# Patient Record
Sex: Male | Born: 1950 | Race: White | Hispanic: No | Marital: Married | Smoking: Never smoker
Health system: Southern US, Community
[De-identification: ages and names within clinical notes are randomized; demographics above are authoritative.]

## PROBLEM LIST (undated history)

## (undated) DIAGNOSIS — G4730 Sleep apnea, unspecified: Secondary | ICD-10-CM

## (undated) DIAGNOSIS — M549 Dorsalgia, unspecified: Secondary | ICD-10-CM

## (undated) DIAGNOSIS — R0600 Dyspnea, unspecified: Secondary | ICD-10-CM

## (undated) DIAGNOSIS — I10 Essential (primary) hypertension: Secondary | ICD-10-CM

## (undated) DIAGNOSIS — F32A Depression, unspecified: Secondary | ICD-10-CM

## (undated) DIAGNOSIS — R011 Cardiac murmur, unspecified: Secondary | ICD-10-CM

## (undated) DIAGNOSIS — I471 Supraventricular tachycardia, unspecified: Secondary | ICD-10-CM

## (undated) DIAGNOSIS — Z923 Personal history of irradiation: Secondary | ICD-10-CM

## (undated) DIAGNOSIS — I6529 Occlusion and stenosis of unspecified carotid artery: Secondary | ICD-10-CM

## (undated) DIAGNOSIS — K8020 Calculus of gallbladder without cholecystitis without obstruction: Secondary | ICD-10-CM

## (undated) DIAGNOSIS — F329 Major depressive disorder, single episode, unspecified: Secondary | ICD-10-CM

## (undated) DIAGNOSIS — I499 Cardiac arrhythmia, unspecified: Secondary | ICD-10-CM

## (undated) DIAGNOSIS — G8929 Other chronic pain: Secondary | ICD-10-CM

## (undated) DIAGNOSIS — I480 Paroxysmal atrial fibrillation: Secondary | ICD-10-CM

## (undated) DIAGNOSIS — M1990 Unspecified osteoarthritis, unspecified site: Secondary | ICD-10-CM

## (undated) DIAGNOSIS — C801 Malignant (primary) neoplasm, unspecified: Secondary | ICD-10-CM

## (undated) HISTORY — PX: ELBOW SURGERY: SHX618

## (undated) HISTORY — DX: Depression, unspecified: F32.A

## (undated) HISTORY — PX: FRACTURE SURGERY: SHX138

## (undated) HISTORY — DX: Supraventricular tachycardia, unspecified: I47.10

## (undated) HISTORY — DX: Calculus of gallbladder without cholecystitis without obstruction: K80.20

## (undated) HISTORY — DX: Personal history of irradiation: Z92.3

## (undated) HISTORY — DX: Major depressive disorder, single episode, unspecified: F32.9

## (undated) HISTORY — PX: KNEE SURGERY: SHX244

## (undated) HISTORY — DX: Malignant (primary) neoplasm, unspecified: C80.1

## (undated) HISTORY — DX: Other chronic pain: G89.29

## (undated) HISTORY — PX: APPENDECTOMY: SHX54

## (undated) HISTORY — PX: TONSILLECTOMY: SUR1361

## (undated) HISTORY — DX: Supraventricular tachycardia: I47.1

## (undated) HISTORY — DX: Unspecified osteoarthritis, unspecified site: M19.90

## (undated) HISTORY — DX: Other chronic pain: M54.9

---

## 1977-08-12 HISTORY — PX: OTHER SURGICAL HISTORY: SHX169

## 1988-06-14 HISTORY — PX: BACK SURGERY: SHX140

## 1999-08-13 HISTORY — PX: LUMBAR LAMINECTOMY: SHX95

## 2002-12-29 ENCOUNTER — Inpatient Hospital Stay (HOSPITAL_COMMUNITY): Admission: EM | Admit: 2002-12-29 | Discharge: 2002-12-31 | Payer: Self-pay | Admitting: Emergency Medicine

## 2002-12-29 ENCOUNTER — Encounter: Payer: Self-pay | Admitting: Emergency Medicine

## 2003-01-09 ENCOUNTER — Encounter: Admission: RE | Admit: 2003-01-09 | Discharge: 2003-01-09 | Payer: Self-pay | Admitting: Internal Medicine

## 2003-01-09 ENCOUNTER — Encounter: Payer: Self-pay | Admitting: Internal Medicine

## 2003-01-11 ENCOUNTER — Emergency Department (HOSPITAL_COMMUNITY): Admission: EM | Admit: 2003-01-11 | Discharge: 2003-01-11 | Payer: Self-pay | Admitting: Emergency Medicine

## 2003-05-14 ENCOUNTER — Encounter: Admission: RE | Admit: 2003-05-14 | Discharge: 2003-05-14 | Payer: Self-pay | Admitting: Internal Medicine

## 2004-04-02 ENCOUNTER — Ambulatory Visit: Payer: Self-pay | Admitting: Anesthesiology

## 2004-05-04 ENCOUNTER — Ambulatory Visit: Payer: Self-pay | Admitting: Anesthesiology

## 2004-05-27 ENCOUNTER — Ambulatory Visit: Payer: Self-pay | Admitting: Anesthesiology

## 2004-06-16 ENCOUNTER — Ambulatory Visit: Payer: Self-pay | Admitting: Anesthesiology

## 2004-07-20 ENCOUNTER — Ambulatory Visit: Payer: Self-pay | Admitting: Anesthesiology

## 2004-08-10 ENCOUNTER — Ambulatory Visit: Payer: Self-pay | Admitting: Anesthesiology

## 2004-08-24 ENCOUNTER — Ambulatory Visit: Payer: Self-pay | Admitting: Anesthesiology

## 2004-09-23 ENCOUNTER — Ambulatory Visit: Payer: Self-pay | Admitting: Anesthesiology

## 2004-10-14 ENCOUNTER — Ambulatory Visit: Payer: Self-pay | Admitting: Internal Medicine

## 2004-10-20 ENCOUNTER — Ambulatory Visit: Payer: Self-pay | Admitting: Anesthesiology

## 2004-11-19 ENCOUNTER — Ambulatory Visit: Payer: Self-pay | Admitting: Anesthesiology

## 2004-12-10 ENCOUNTER — Ambulatory Visit: Payer: Self-pay | Admitting: Anesthesiology

## 2005-01-05 ENCOUNTER — Ambulatory Visit: Payer: Self-pay | Admitting: Anesthesiology

## 2005-02-08 ENCOUNTER — Ambulatory Visit: Payer: Self-pay | Admitting: Anesthesiology

## 2005-02-23 ENCOUNTER — Ambulatory Visit: Payer: Self-pay | Admitting: Anesthesiology

## 2005-03-17 ENCOUNTER — Ambulatory Visit: Payer: Self-pay | Admitting: Anesthesiology

## 2005-04-28 ENCOUNTER — Ambulatory Visit: Payer: Self-pay | Admitting: Anesthesiology

## 2005-06-02 ENCOUNTER — Ambulatory Visit: Payer: Self-pay | Admitting: Anesthesiology

## 2005-06-29 ENCOUNTER — Ambulatory Visit: Payer: Self-pay | Admitting: Anesthesiology

## 2005-07-27 ENCOUNTER — Ambulatory Visit: Payer: Self-pay | Admitting: Anesthesiology

## 2005-08-25 ENCOUNTER — Ambulatory Visit: Payer: Self-pay | Admitting: Anesthesiology

## 2005-09-23 ENCOUNTER — Ambulatory Visit: Payer: Self-pay | Admitting: Anesthesiology

## 2005-10-28 ENCOUNTER — Ambulatory Visit: Payer: Self-pay | Admitting: Anesthesiology

## 2005-11-25 ENCOUNTER — Ambulatory Visit: Payer: Self-pay | Admitting: Anesthesiology

## 2005-12-20 ENCOUNTER — Ambulatory Visit: Payer: Self-pay | Admitting: Anesthesiology

## 2006-01-24 ENCOUNTER — Ambulatory Visit: Payer: Self-pay | Admitting: Anesthesiology

## 2006-02-17 ENCOUNTER — Ambulatory Visit: Payer: Self-pay | Admitting: Anesthesiology

## 2006-03-22 ENCOUNTER — Ambulatory Visit: Payer: Self-pay | Admitting: Anesthesiology

## 2006-04-20 ENCOUNTER — Ambulatory Visit: Payer: Self-pay | Admitting: Anesthesiology

## 2006-05-19 ENCOUNTER — Ambulatory Visit: Payer: Self-pay | Admitting: Anesthesiology

## 2006-06-20 ENCOUNTER — Ambulatory Visit: Payer: Self-pay | Admitting: Anesthesiology

## 2006-07-18 ENCOUNTER — Ambulatory Visit: Payer: Self-pay | Admitting: Anesthesiology

## 2006-08-10 ENCOUNTER — Ambulatory Visit: Payer: Self-pay | Admitting: Anesthesiology

## 2006-08-31 ENCOUNTER — Ambulatory Visit: Payer: Self-pay | Admitting: Anesthesiology

## 2006-10-03 ENCOUNTER — Ambulatory Visit: Payer: Self-pay | Admitting: Anesthesiology

## 2006-10-31 ENCOUNTER — Ambulatory Visit: Payer: Self-pay | Admitting: Anesthesiology

## 2006-11-24 ENCOUNTER — Ambulatory Visit: Payer: Self-pay | Admitting: Anesthesiology

## 2006-12-12 ENCOUNTER — Ambulatory Visit: Payer: Self-pay | Admitting: Internal Medicine

## 2006-12-27 ENCOUNTER — Ambulatory Visit: Payer: Self-pay | Admitting: Anesthesiology

## 2007-02-02 ENCOUNTER — Ambulatory Visit: Payer: Self-pay | Admitting: Anesthesiology

## 2007-03-08 ENCOUNTER — Ambulatory Visit: Payer: Self-pay | Admitting: Anesthesiology

## 2007-03-30 ENCOUNTER — Ambulatory Visit: Payer: Self-pay | Admitting: Anesthesiology

## 2007-04-27 ENCOUNTER — Ambulatory Visit: Payer: Self-pay | Admitting: Anesthesiology

## 2007-05-23 ENCOUNTER — Ambulatory Visit: Payer: Self-pay | Admitting: Anesthesiology

## 2007-07-03 ENCOUNTER — Ambulatory Visit: Payer: Self-pay | Admitting: Anesthesiology

## 2007-08-03 ENCOUNTER — Ambulatory Visit: Payer: Self-pay | Admitting: Anesthesiology

## 2007-09-01 ENCOUNTER — Ambulatory Visit: Payer: Self-pay | Admitting: Anesthesiology

## 2007-09-28 ENCOUNTER — Ambulatory Visit: Payer: Self-pay | Admitting: Anesthesiology

## 2007-10-09 ENCOUNTER — Encounter: Admission: RE | Admit: 2007-10-09 | Discharge: 2007-10-09 | Payer: Self-pay | Admitting: Internal Medicine

## 2007-11-01 ENCOUNTER — Ambulatory Visit: Payer: Self-pay | Admitting: Anesthesiology

## 2007-11-29 ENCOUNTER — Ambulatory Visit: Payer: Self-pay | Admitting: Internal Medicine

## 2007-12-04 ENCOUNTER — Ambulatory Visit: Payer: Self-pay | Admitting: Anesthesiology

## 2007-12-28 ENCOUNTER — Ambulatory Visit: Payer: Self-pay | Admitting: Anesthesiology

## 2008-01-24 ENCOUNTER — Ambulatory Visit: Payer: Self-pay | Admitting: Anesthesiology

## 2008-01-25 ENCOUNTER — Encounter: Admission: RE | Admit: 2008-01-25 | Discharge: 2008-01-25 | Payer: Self-pay | Admitting: Internal Medicine

## 2008-02-23 ENCOUNTER — Ambulatory Visit: Payer: Self-pay | Admitting: Anesthesiology

## 2008-03-27 ENCOUNTER — Ambulatory Visit: Payer: Self-pay | Admitting: Anesthesiology

## 2008-04-24 ENCOUNTER — Ambulatory Visit: Payer: Self-pay | Admitting: Anesthesiology

## 2008-05-24 ENCOUNTER — Ambulatory Visit: Payer: Self-pay | Admitting: Internal Medicine

## 2008-05-28 ENCOUNTER — Ambulatory Visit: Payer: Self-pay | Admitting: Anesthesiology

## 2008-06-26 ENCOUNTER — Ambulatory Visit: Payer: Self-pay | Admitting: Anesthesiology

## 2008-07-17 ENCOUNTER — Ambulatory Visit: Payer: Self-pay | Admitting: Anesthesiology

## 2008-08-20 ENCOUNTER — Ambulatory Visit: Payer: Self-pay | Admitting: Anesthesiology

## 2008-09-24 ENCOUNTER — Ambulatory Visit: Payer: Self-pay | Admitting: Anesthesiology

## 2008-10-14 ENCOUNTER — Ambulatory Visit: Payer: Self-pay | Admitting: Anesthesiology

## 2008-11-21 ENCOUNTER — Ambulatory Visit: Payer: Self-pay | Admitting: Anesthesiology

## 2008-12-12 DIAGNOSIS — F329 Major depressive disorder, single episode, unspecified: Secondary | ICD-10-CM | POA: Insufficient documentation

## 2008-12-12 DIAGNOSIS — M5126 Other intervertebral disc displacement, lumbar region: Secondary | ICD-10-CM | POA: Insufficient documentation

## 2008-12-12 DIAGNOSIS — I471 Supraventricular tachycardia, unspecified: Secondary | ICD-10-CM | POA: Insufficient documentation

## 2008-12-13 ENCOUNTER — Ambulatory Visit: Payer: Self-pay | Admitting: Internal Medicine

## 2008-12-24 ENCOUNTER — Ambulatory Visit: Payer: Self-pay | Admitting: Anesthesiology

## 2009-01-07 ENCOUNTER — Emergency Department (HOSPITAL_COMMUNITY): Admission: EM | Admit: 2009-01-07 | Discharge: 2009-01-07 | Payer: Self-pay | Admitting: Emergency Medicine

## 2009-01-17 ENCOUNTER — Ambulatory Visit: Payer: Self-pay | Admitting: Internal Medicine

## 2009-01-25 ENCOUNTER — Emergency Department (HOSPITAL_COMMUNITY): Admission: EM | Admit: 2009-01-25 | Discharge: 2009-01-25 | Payer: Self-pay | Admitting: Emergency Medicine

## 2009-01-27 ENCOUNTER — Telehealth: Payer: Self-pay | Admitting: Internal Medicine

## 2009-02-03 ENCOUNTER — Ambulatory Visit: Payer: Self-pay | Admitting: Internal Medicine

## 2009-02-06 ENCOUNTER — Ambulatory Visit: Payer: Self-pay | Admitting: Anesthesiology

## 2009-02-18 ENCOUNTER — Ambulatory Visit: Payer: Self-pay | Admitting: Internal Medicine

## 2009-02-19 ENCOUNTER — Emergency Department (HOSPITAL_COMMUNITY): Admission: EM | Admit: 2009-02-19 | Discharge: 2009-02-19 | Payer: Self-pay | Admitting: Emergency Medicine

## 2009-02-20 ENCOUNTER — Inpatient Hospital Stay (HOSPITAL_COMMUNITY): Admission: RE | Admit: 2009-02-20 | Discharge: 2009-02-20 | Payer: Self-pay | Admitting: Internal Medicine

## 2009-02-20 ENCOUNTER — Ambulatory Visit: Payer: Self-pay | Admitting: Internal Medicine

## 2009-02-21 LAB — CONVERTED CEMR LAB
BUN: 14 mg/dL (ref 6–23)
Basophils Relative: 0.5 % (ref 0.0–3.0)
CO2: 30 meq/L (ref 19–32)
Chloride: 103 meq/L (ref 96–112)
Creatinine, Ser: 0.9 mg/dL (ref 0.4–1.5)
Eosinophils Absolute: 0.2 10*3/uL (ref 0.0–0.7)
Eosinophils Relative: 3.6 % (ref 0.0–5.0)
HCT: 44.6 % (ref 39.0–52.0)
INR: 1 (ref 0.8–1.0)
Lymphs Abs: 1.3 10*3/uL (ref 0.7–4.0)
MCHC: 33.4 g/dL (ref 30.0–36.0)
MCV: 85.8 fL (ref 78.0–100.0)
Monocytes Absolute: 0.5 10*3/uL (ref 0.1–1.0)
Neutrophils Relative %: 64.6 % (ref 43.0–77.0)
Platelets: 193 10*3/uL (ref 150.0–400.0)
Potassium: 3.8 meq/L (ref 3.5–5.1)
Prothrombin Time: 10.8 s (ref 9.1–11.7)
WBC: 5.6 10*3/uL (ref 4.5–10.5)

## 2009-03-07 ENCOUNTER — Ambulatory Visit: Payer: Self-pay | Admitting: Anesthesiology

## 2009-03-25 ENCOUNTER — Encounter: Payer: Self-pay | Admitting: Internal Medicine

## 2009-03-27 ENCOUNTER — Ambulatory Visit: Payer: Self-pay | Admitting: Internal Medicine

## 2009-05-06 ENCOUNTER — Ambulatory Visit: Payer: Self-pay | Admitting: Anesthesiology

## 2009-06-14 DIAGNOSIS — C801 Malignant (primary) neoplasm, unspecified: Secondary | ICD-10-CM

## 2009-06-14 HISTORY — DX: Malignant (primary) neoplasm, unspecified: C80.1

## 2009-06-24 ENCOUNTER — Ambulatory Visit: Payer: Self-pay | Admitting: Anesthesiology

## 2009-09-18 ENCOUNTER — Ambulatory Visit: Payer: Self-pay | Admitting: Anesthesiology

## 2009-12-08 ENCOUNTER — Ambulatory Visit: Payer: Self-pay | Admitting: Anesthesiology

## 2010-01-19 IMAGING — CR DG CHEST 1V PORT
1 series · 1 of 1 positions shown · non-contrast
Comparison: Chest 01/25/2009.

CLINICAL DATA: Chest pain.

PORTABLE CHEST - 1 VIEW

[view not recorded]
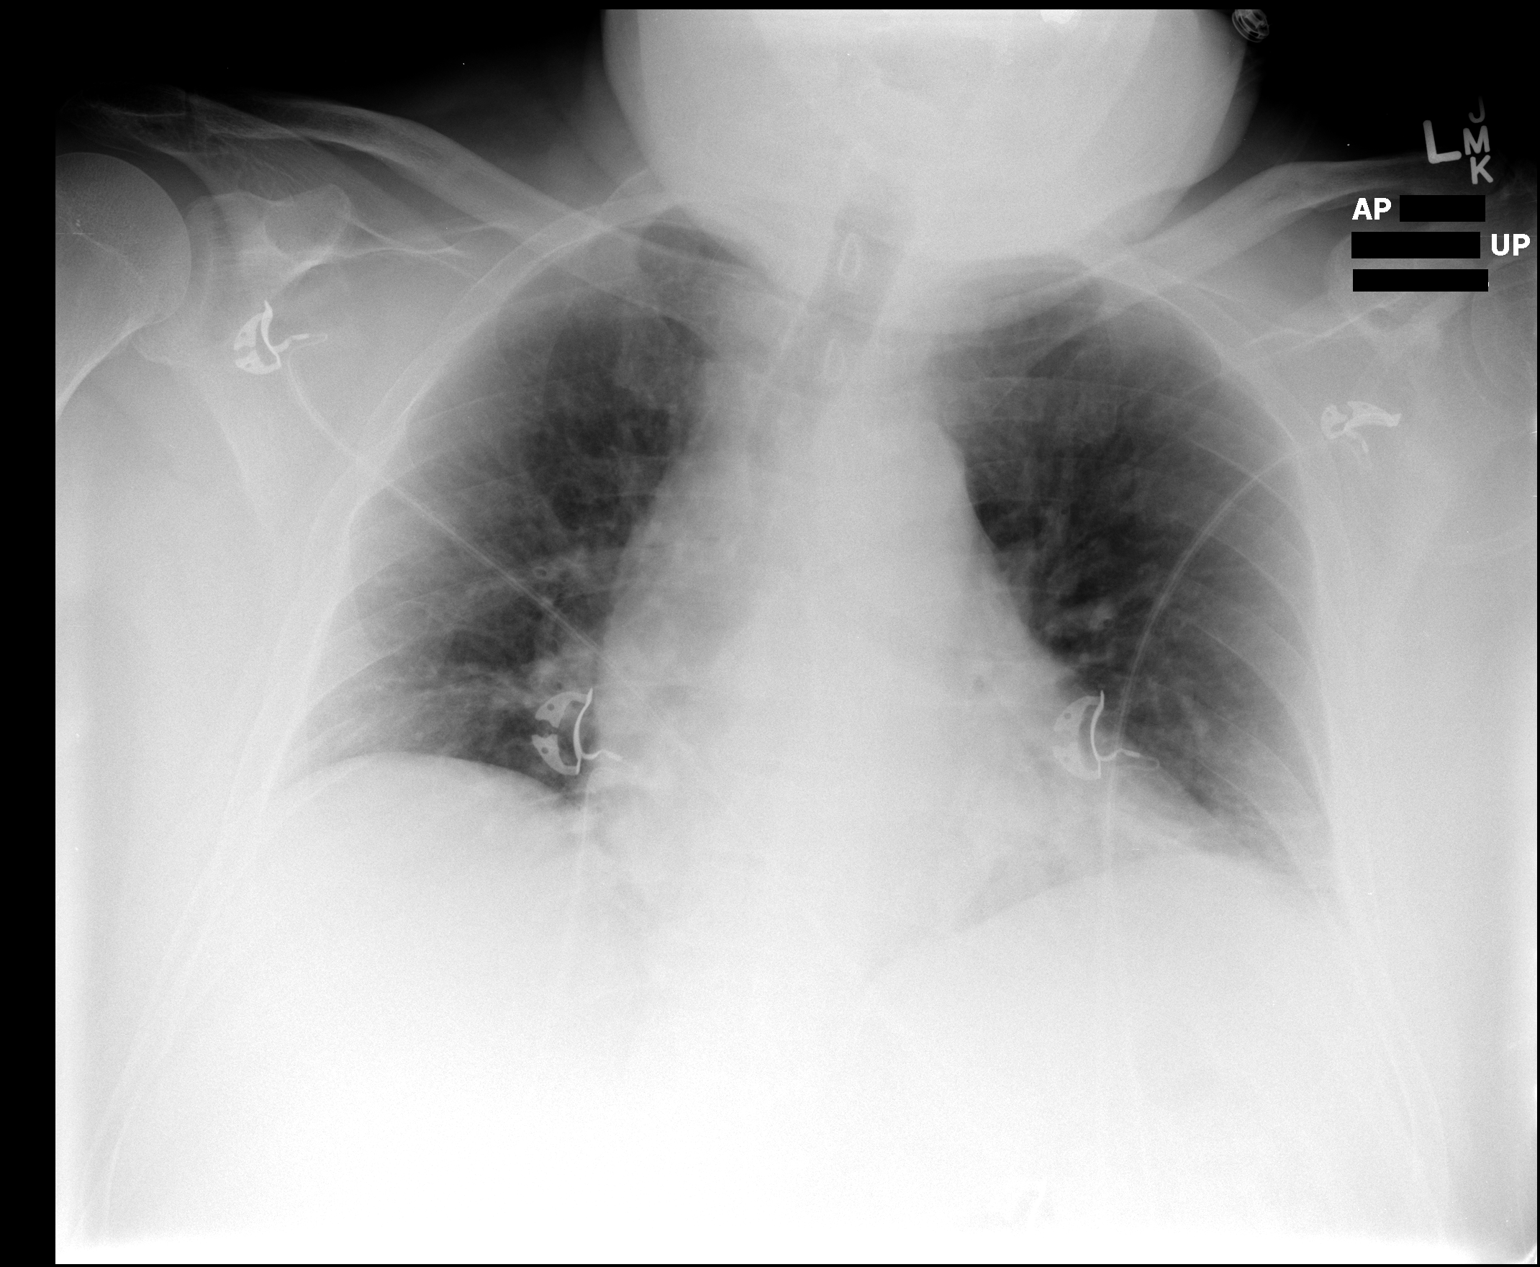

[1 of 1 positions shown; findings below may reference images not displayed]

FINDINGS: Lung volumes are low with some mild left basilar
subsegmental atelectasis or scar.  Lungs otherwise clear.  Mild
cardiomegaly noted.  No effusion.
IMPRESSION: No acute disease.

## 2010-01-20 ENCOUNTER — Encounter: Admission: RE | Admit: 2010-01-20 | Discharge: 2010-01-20 | Payer: Self-pay | Admitting: Otolaryngology

## 2010-01-20 ENCOUNTER — Other Ambulatory Visit: Admission: RE | Admit: 2010-01-20 | Discharge: 2010-01-20 | Payer: Self-pay | Admitting: Otolaryngology

## 2010-01-27 ENCOUNTER — Ambulatory Visit (HOSPITAL_COMMUNITY): Admission: RE | Admit: 2010-01-27 | Discharge: 2010-01-27 | Payer: Self-pay | Admitting: Otolaryngology

## 2010-01-29 ENCOUNTER — Encounter: Admission: AD | Admit: 2010-01-29 | Discharge: 2010-01-29 | Payer: Self-pay | Admitting: Dentistry

## 2010-01-29 ENCOUNTER — Ambulatory Visit: Payer: Self-pay | Admitting: Dentistry

## 2010-01-30 ENCOUNTER — Ambulatory Visit (HOSPITAL_BASED_OUTPATIENT_CLINIC_OR_DEPARTMENT_OTHER): Admission: RE | Admit: 2010-01-30 | Discharge: 2010-01-30 | Payer: Self-pay | Admitting: Otolaryngology

## 2010-02-02 ENCOUNTER — Ambulatory Visit: Payer: Self-pay | Admitting: Dentistry

## 2010-02-03 ENCOUNTER — Ambulatory Visit: Admission: RE | Admit: 2010-02-03 | Discharge: 2010-04-16 | Payer: Self-pay | Admitting: Radiation Oncology

## 2010-02-04 ENCOUNTER — Encounter: Payer: Self-pay | Admitting: Internal Medicine

## 2010-02-04 ENCOUNTER — Ambulatory Visit: Payer: Self-pay | Admitting: Oncology

## 2010-02-04 LAB — COMPREHENSIVE METABOLIC PANEL
AST: 21 U/L (ref 0–37)
Albumin: 4 g/dL (ref 3.5–5.2)
Alkaline Phosphatase: 76 U/L (ref 39–117)
BUN: 15 mg/dL (ref 6–23)
Calcium: 9.5 mg/dL (ref 8.4–10.5)
Chloride: 104 mEq/L (ref 96–112)
Potassium: 3.6 mEq/L (ref 3.5–5.3)
Sodium: 140 mEq/L (ref 135–145)
Total Protein: 7 g/dL (ref 6.0–8.3)

## 2010-02-04 LAB — CBC WITH DIFFERENTIAL/PLATELET
Basophils Absolute: 0 10*3/uL (ref 0.0–0.1)
EOS%: 4.3 % (ref 0.0–7.0)
Eosinophils Absolute: 0.3 10*3/uL (ref 0.0–0.5)
HGB: 14.3 g/dL (ref 13.0–17.1)
MCH: 28.1 pg (ref 27.2–33.4)
NEUT#: 4.2 10*3/uL (ref 1.5–6.5)
RBC: 5.09 10*6/uL (ref 4.20–5.82)
RDW: 12.9 % (ref 11.0–14.6)
lymph#: 1.9 10*3/uL (ref 0.9–3.3)

## 2010-02-27 ENCOUNTER — Ambulatory Visit (HOSPITAL_COMMUNITY): Admission: RE | Admit: 2010-02-27 | Discharge: 2010-02-27 | Payer: Self-pay | Admitting: Radiation Oncology

## 2010-03-02 ENCOUNTER — Encounter: Payer: Self-pay | Admitting: Internal Medicine

## 2010-03-02 LAB — CBC WITH DIFFERENTIAL/PLATELET
BASO%: 0.6 % (ref 0.0–2.0)
EOS%: 5.3 % (ref 0.0–7.0)
HCT: 44.8 % (ref 38.4–49.9)
LYMPH%: 28.1 % (ref 14.0–49.0)
MCH: 28.2 pg (ref 27.2–33.4)
MCHC: 34.4 g/dL (ref 32.0–36.0)
MCV: 81.9 fL (ref 79.3–98.0)
MONO%: 9.4 % (ref 0.0–14.0)
NEUT%: 56.6 % (ref 39.0–75.0)
Platelets: 168 10*3/uL (ref 140–400)

## 2010-03-02 LAB — COMPREHENSIVE METABOLIC PANEL
ALT: 24 U/L (ref 0–53)
CO2: 26 mEq/L (ref 19–32)
Sodium: 135 mEq/L (ref 135–145)
Total Bilirubin: 1.4 mg/dL — ABNORMAL HIGH (ref 0.3–1.2)
Total Protein: 7.7 g/dL (ref 6.0–8.3)

## 2010-03-02 LAB — MAGNESIUM: Magnesium: 2 mg/dL (ref 1.5–2.5)

## 2010-03-06 ENCOUNTER — Ambulatory Visit: Payer: Self-pay | Admitting: Oncology

## 2010-03-06 LAB — BASIC METABOLIC PANEL
BUN: 20 mg/dL (ref 6–23)
Creatinine, Ser: 1.02 mg/dL (ref 0.40–1.50)

## 2010-03-06 LAB — MAGNESIUM: Magnesium: 1.8 mg/dL (ref 1.5–2.5)

## 2010-03-10 ENCOUNTER — Encounter
Admission: RE | Admit: 2010-03-10 | Discharge: 2010-06-03 | Payer: Self-pay | Source: Home / Self Care | Attending: Radiation Oncology | Admitting: Radiation Oncology

## 2010-03-13 LAB — BASIC METABOLIC PANEL
Calcium: 9.4 mg/dL (ref 8.4–10.5)
Creatinine, Ser: 0.92 mg/dL (ref 0.40–1.50)
Sodium: 137 mEq/L (ref 135–145)

## 2010-03-13 LAB — MAGNESIUM: Magnesium: 1.8 mg/dL (ref 1.5–2.5)

## 2010-03-20 LAB — CBC WITH DIFFERENTIAL/PLATELET
Basophils Absolute: 0 10*3/uL (ref 0.0–0.1)
EOS%: 5.1 % (ref 0.0–7.0)
Eosinophils Absolute: 0.1 10*3/uL (ref 0.0–0.5)
HGB: 12.8 g/dL — ABNORMAL LOW (ref 13.0–17.1)
LYMPH%: 22.4 % (ref 14.0–49.0)
MCH: 28 pg (ref 27.2–33.4)
MCV: 81.4 fL (ref 79.3–98.0)
MONO%: 37.2 % — ABNORMAL HIGH (ref 0.0–14.0)
NEUT#: 0.5 10*3/uL — ABNORMAL LOW (ref 1.5–6.5)
Platelets: 261 10*3/uL (ref 140–400)

## 2010-03-20 LAB — COMPREHENSIVE METABOLIC PANEL
Alkaline Phosphatase: 81 U/L (ref 39–117)
BUN: 20 mg/dL (ref 6–23)
CO2: 29 mEq/L (ref 19–32)
Creatinine, Ser: 0.94 mg/dL (ref 0.40–1.50)
Glucose, Bld: 107 mg/dL — ABNORMAL HIGH (ref 70–99)
Total Bilirubin: 0.8 mg/dL (ref 0.3–1.2)

## 2010-03-20 LAB — MAGNESIUM: Magnesium: 2.1 mg/dL (ref 1.5–2.5)

## 2010-03-23 LAB — CBC WITH DIFFERENTIAL/PLATELET
BASO%: 0.4 % (ref 0.0–2.0)
Eosinophils Absolute: 0.1 10*3/uL (ref 0.0–0.5)
HCT: 37 % — ABNORMAL LOW (ref 38.4–49.9)
HGB: 12.8 g/dL — ABNORMAL LOW (ref 13.0–17.1)
MCHC: 34.6 g/dL (ref 32.0–36.0)
MONO#: 0.6 10*3/uL (ref 0.1–0.9)
NEUT#: 1.7 10*3/uL (ref 1.5–6.5)
NEUT%: 60.6 % (ref 39.0–75.0)
WBC: 2.8 10*3/uL — ABNORMAL LOW (ref 4.0–10.3)
lymph#: 0.4 10*3/uL — ABNORMAL LOW (ref 0.9–3.3)

## 2010-03-24 ENCOUNTER — Ambulatory Visit (HOSPITAL_COMMUNITY): Admission: RE | Admit: 2010-03-24 | Discharge: 2010-03-24 | Payer: Self-pay | Admitting: Oncology

## 2010-03-27 ENCOUNTER — Inpatient Hospital Stay (HOSPITAL_COMMUNITY): Admission: AD | Admit: 2010-03-27 | Discharge: 2010-04-02 | Payer: Self-pay | Admitting: Oncology

## 2010-03-27 ENCOUNTER — Ambulatory Visit: Payer: Self-pay | Admitting: Hematology & Oncology

## 2010-03-27 LAB — BASIC METABOLIC PANEL
BUN: 62 mg/dL — ABNORMAL HIGH (ref 6–23)
CO2: 27 mEq/L (ref 19–32)
Chloride: 99 mEq/L (ref 96–112)
Potassium: 3.8 mEq/L (ref 3.5–5.3)

## 2010-03-27 LAB — URINALYSIS, MICROSCOPIC - CHCC
Bilirubin (Urine): NEGATIVE
Blood: NEGATIVE
Glucose: NEGATIVE g/dL
Nitrite: NEGATIVE
Specific Gravity, Urine: 1.015 (ref 1.003–1.035)
pH: 6 (ref 4.6–8.0)

## 2010-03-28 ENCOUNTER — Ambulatory Visit: Payer: Self-pay | Admitting: Oncology

## 2010-04-06 ENCOUNTER — Ambulatory Visit: Payer: Self-pay | Admitting: Oncology

## 2010-04-06 LAB — CBC WITH DIFFERENTIAL/PLATELET
Eosinophils Absolute: 0.1 10*3/uL (ref 0.0–0.5)
HCT: 30.6 % — ABNORMAL LOW (ref 38.4–49.9)
LYMPH%: 13.5 % — ABNORMAL LOW (ref 14.0–49.0)
MONO#: 0.2 10*3/uL (ref 0.1–0.9)
NEUT#: 1.2 10*3/uL — ABNORMAL LOW (ref 1.5–6.5)
NEUT%: 71.8 % (ref 39.0–75.0)
Platelets: 63 10*3/uL — ABNORMAL LOW (ref 140–400)
WBC: 1.6 10*3/uL — ABNORMAL LOW (ref 4.0–10.3)

## 2010-04-06 LAB — BASIC METABOLIC PANEL
BUN: 40 mg/dL — ABNORMAL HIGH (ref 6–23)
CO2: 34 mEq/L — ABNORMAL HIGH (ref 19–32)
Chloride: 99 mEq/L (ref 96–112)
Glucose, Bld: 113 mg/dL — ABNORMAL HIGH (ref 70–99)
Potassium: 3.6 mEq/L (ref 3.5–5.3)

## 2010-04-10 LAB — COMPREHENSIVE METABOLIC PANEL
Alkaline Phosphatase: 68 U/L (ref 39–117)
Glucose, Bld: 100 mg/dL — ABNORMAL HIGH (ref 70–99)
Sodium: 138 mEq/L (ref 135–145)
Total Bilirubin: 0.5 mg/dL (ref 0.3–1.2)
Total Protein: 6.4 g/dL (ref 6.0–8.3)

## 2010-04-10 LAB — CBC WITH DIFFERENTIAL/PLATELET
Eosinophils Absolute: 0.1 10*3/uL (ref 0.0–0.5)
LYMPH%: 13 % — ABNORMAL LOW (ref 14.0–49.0)
MCHC: 35.1 g/dL (ref 32.0–36.0)
MCV: 80.9 fL (ref 79.3–98.0)
MONO%: 31.3 % — ABNORMAL HIGH (ref 0.0–14.0)
Platelets: 267 10*3/uL (ref 140–400)
RBC: 3.45 10*6/uL — ABNORMAL LOW (ref 4.20–5.82)

## 2010-04-13 LAB — MAGNESIUM: Magnesium: 1.8 mg/dL (ref 1.5–2.5)

## 2010-04-13 LAB — CBC WITH DIFFERENTIAL/PLATELET
Basophils Absolute: 0 10*3/uL (ref 0.0–0.1)
Eosinophils Absolute: 0.1 10*3/uL (ref 0.0–0.5)
HCT: 29.3 % — ABNORMAL LOW (ref 38.4–49.9)
HGB: 9.9 g/dL — ABNORMAL LOW (ref 13.0–17.1)
LYMPH%: 18.8 % (ref 14.0–49.0)
MCHC: 33.8 g/dL (ref 32.0–36.0)
MONO#: 0.5 10*3/uL (ref 0.1–0.9)
NEUT#: 2.9 10*3/uL (ref 1.5–6.5)
NEUT%: 67 % (ref 39.0–75.0)
Platelets: 322 10*3/uL (ref 140–400)
WBC: 4.3 10*3/uL (ref 4.0–10.3)

## 2010-04-13 LAB — COMPREHENSIVE METABOLIC PANEL
Albumin: 3.7 g/dL (ref 3.5–5.2)
BUN: 20 mg/dL (ref 6–23)
CO2: 32 mEq/L (ref 19–32)
Calcium: 9.8 mg/dL (ref 8.4–10.5)
Chloride: 98 mEq/L (ref 96–112)
Creatinine, Ser: 1.65 mg/dL — ABNORMAL HIGH (ref 0.40–1.50)
Glucose, Bld: 100 mg/dL — ABNORMAL HIGH (ref 70–99)

## 2010-04-20 LAB — BASIC METABOLIC PANEL
BUN: 28 mg/dL — ABNORMAL HIGH (ref 6–23)
Calcium: 10.1 mg/dL (ref 8.4–10.5)
Chloride: 100 mEq/L (ref 96–112)
Creatinine, Ser: 1.56 mg/dL — ABNORMAL HIGH (ref 0.40–1.50)

## 2010-04-20 LAB — PHOSPHORUS: Phosphorus: 2.7 mg/dL (ref 2.3–4.6)

## 2010-04-20 LAB — MAGNESIUM: Magnesium: 2.1 mg/dL (ref 1.5–2.5)

## 2010-04-30 LAB — CBC WITH DIFFERENTIAL/PLATELET
BASO%: 0 % (ref 0.0–2.0)
EOS%: 2.2 % (ref 0.0–7.0)
MCH: 28.8 pg (ref 27.2–33.4)
MCHC: 34.1 g/dL (ref 32.0–36.0)
MONO#: 0.6 10*3/uL (ref 0.1–0.9)
NEUT%: 86.2 % — ABNORMAL HIGH (ref 39.0–75.0)
RBC: 3.92 10*6/uL — ABNORMAL LOW (ref 4.20–5.82)
RDW: 16.4 % — ABNORMAL HIGH (ref 11.0–14.6)
WBC: 6.9 10*3/uL (ref 4.0–10.3)
lymph#: 0.2 10*3/uL — ABNORMAL LOW (ref 0.9–3.3)

## 2010-04-30 LAB — BASIC METABOLIC PANEL
CO2: 32 mEq/L (ref 19–32)
Chloride: 97 mEq/L (ref 96–112)
Creatinine, Ser: 1.2 mg/dL (ref 0.40–1.50)
Sodium: 139 mEq/L (ref 135–145)

## 2010-04-30 LAB — PHOSPHORUS: Phosphorus: 2.4 mg/dL (ref 2.3–4.6)

## 2010-05-11 ENCOUNTER — Ambulatory Visit: Payer: Self-pay | Admitting: Oncology

## 2010-05-13 LAB — MAGNESIUM: Magnesium: 1.7 mg/dL (ref 1.5–2.5)

## 2010-05-13 LAB — BASIC METABOLIC PANEL
CO2: 27 mEq/L (ref 19–32)
Calcium: 9.9 mg/dL (ref 8.4–10.5)
Glucose, Bld: 140 mg/dL — ABNORMAL HIGH (ref 70–99)
Potassium: 3.6 mEq/L (ref 3.5–5.3)
Sodium: 137 mEq/L (ref 135–145)

## 2010-06-10 ENCOUNTER — Ambulatory Visit: Payer: Self-pay | Admitting: Oncology

## 2010-06-24 LAB — CBC WITH DIFFERENTIAL/PLATELET
BASO%: 0.7 % (ref 0.0–2.0)
Basophils Absolute: 0 10*3/uL (ref 0.0–0.1)
EOS%: 2.8 % (ref 0.0–7.0)
Eosinophils Absolute: 0.2 10*3/uL (ref 0.0–0.5)
HCT: 35 % — ABNORMAL LOW (ref 38.4–49.9)
HGB: 11.8 g/dL — ABNORMAL LOW (ref 13.0–17.1)
LYMPH%: 5.5 % — ABNORMAL LOW (ref 14.0–49.0)
MCH: 30.5 pg (ref 27.2–33.4)
MCHC: 33.7 g/dL (ref 32.0–36.0)
MCV: 90.5 fL (ref 79.3–98.0)
MONO#: 0.6 10*3/uL (ref 0.1–0.9)
MONO%: 9.5 % (ref 0.0–14.0)
NEUT#: 5.4 10*3/uL (ref 1.5–6.5)
NEUT%: 81.5 % — ABNORMAL HIGH (ref 39.0–75.0)
Platelets: 207 10*3/uL (ref 140–400)
RBC: 3.87 10*6/uL — ABNORMAL LOW (ref 4.20–5.82)
RDW: 15 % — ABNORMAL HIGH (ref 11.0–14.6)
WBC: 6.7 10*3/uL (ref 4.0–10.3)
lymph#: 0.4 10*3/uL — ABNORMAL LOW (ref 0.9–3.3)

## 2010-06-24 LAB — COMPREHENSIVE METABOLIC PANEL
ALT: 36 U/L (ref 0–53)
AST: 23 U/L (ref 0–37)
Albumin: 4 g/dL (ref 3.5–5.2)
Alkaline Phosphatase: 78 U/L (ref 39–117)
BUN: 20 mg/dL (ref 6–23)
CO2: 29 mEq/L (ref 19–32)
Calcium: 10.2 mg/dL (ref 8.4–10.5)
Chloride: 98 mEq/L (ref 96–112)
Creatinine, Ser: 0.94 mg/dL (ref 0.40–1.50)
Glucose, Bld: 92 mg/dL (ref 70–99)
Potassium: 4 mEq/L (ref 3.5–5.3)
Sodium: 138 mEq/L (ref 135–145)
Total Bilirubin: 1.2 mg/dL (ref 0.3–1.2)
Total Protein: 7.4 g/dL (ref 6.0–8.3)

## 2010-07-10 ENCOUNTER — Ambulatory Visit: Payer: Self-pay | Admitting: Oncology

## 2010-07-13 ENCOUNTER — Ambulatory Visit (HOSPITAL_COMMUNITY)
Admission: RE | Admit: 2010-07-13 | Discharge: 2010-07-13 | Payer: Self-pay | Source: Home / Self Care | Attending: Oncology | Admitting: Oncology

## 2010-07-14 ENCOUNTER — Other Ambulatory Visit: Payer: Self-pay | Admitting: Oncology

## 2010-07-14 ENCOUNTER — Ambulatory Visit (HOSPITAL_COMMUNITY)
Admission: RE | Admit: 2010-07-14 | Discharge: 2010-07-14 | Payer: Self-pay | Source: Home / Self Care | Attending: Oncology | Admitting: Oncology

## 2010-07-14 DIAGNOSIS — C099 Malignant neoplasm of tonsil, unspecified: Secondary | ICD-10-CM

## 2010-07-14 LAB — BASIC METABOLIC PANEL
BUN: 11 mg/dL (ref 6–23)
Potassium: 3.7 mEq/L (ref 3.5–5.3)
Sodium: 143 mEq/L (ref 135–145)

## 2010-07-14 LAB — MAGNESIUM: Magnesium: 1.5 mg/dL (ref 1.5–2.5)

## 2010-07-16 NOTE — Letter (Signed)
[facility]   Imported By: Marylou Mccoy 03/27/2010 14:12:01  _____________________________________________________________________  External Attachment:    Type:   Image     Comment:   External Document

## 2010-07-16 NOTE — Letter (Signed)
[facility]   Imported By: Marylou Mccoy 03/02/2010 10:22:47  _____________________________________________________________________  External Attachment:    Type:   Image     Comment:   External Document

## 2010-07-22 ENCOUNTER — Ambulatory Visit: Payer: Medicare Other | Admitting: Anesthesiology

## 2010-08-26 LAB — BASIC METABOLIC PANEL
BUN: 80 mg/dL — ABNORMAL HIGH (ref 6–23)
CO2: 24 mEq/L (ref 19–32)
Calcium: 8.5 mg/dL (ref 8.4–10.5)
Calcium: 9 mg/dL (ref 8.4–10.5)
Chloride: 107 mEq/L (ref 96–112)
Creatinine, Ser: 9.37 mg/dL — ABNORMAL HIGH (ref 0.4–1.5)
Creatinine, Ser: 9.63 mg/dL — ABNORMAL HIGH (ref 0.4–1.5)
GFR calc Af Amer: 7 mL/min — ABNORMAL LOW (ref >60)
GFR calc Af Amer: 7 mL/min — ABNORMAL LOW (ref >60)
GFR calc Af Amer: 8 mL/min — ABNORMAL LOW (ref >60)
GFR calc non Af Amer: 7 mL/min — ABNORMAL LOW (ref >60)
Glucose, Bld: 108 mg/dL — ABNORMAL HIGH (ref 70–99)
Potassium: 3.8 mEq/L (ref 3.5–5.1)
Sodium: 141 mEq/L (ref 135–145)
Sodium: 141 mEq/L (ref 135–145)

## 2010-08-26 LAB — URINALYSIS, ROUTINE W REFLEX MICROSCOPIC
Bilirubin Urine: NEGATIVE
Bilirubin Urine: NEGATIVE
Glucose, UA: NEGATIVE mg/dL
Glucose, UA: NEGATIVE mg/dL
Hgb urine dipstick: NEGATIVE
Hgb urine dipstick: NEGATIVE
Ketones, ur: NEGATIVE mg/dL
Leukocytes, UA: NEGATIVE
Nitrite: NEGATIVE
Protein, ur: 100 mg/dL — AB
Specific Gravity, Urine: 1.01 (ref 1.005–1.030)
Specific Gravity, Urine: 1.013 (ref 1.005–1.030)
Urobilinogen, UA: 0.2 mg/dL (ref 0.0–1.0)
Urobilinogen, UA: 0.2 mg/dL (ref 0.0–1.0)
pH: 5.5 (ref 5.0–8.0)
pH: 5.5 (ref 5.0–8.0)

## 2010-08-26 LAB — BASIC METABOLIC PANEL WITH GFR
BUN: 75 mg/dL — ABNORMAL HIGH (ref 6–23)
CO2: 26 meq/L (ref 19–32)
Calcium: 8.2 mg/dL — ABNORMAL LOW (ref 8.4–10.5)
Chloride: 105 meq/L (ref 96–112)
Creatinine, Ser: 8.88 mg/dL — ABNORMAL HIGH (ref 0.4–1.5)
GFR calc non Af Amer: 6 mL/min — ABNORMAL LOW
Glucose, Bld: 106 mg/dL — ABNORMAL HIGH (ref 70–99)
Potassium: 3.8 meq/L (ref 3.5–5.1)
Sodium: 140 meq/L (ref 135–145)

## 2010-08-26 LAB — CBC
HCT: 26.8 % — ABNORMAL LOW (ref 39.0–52.0)
HCT: 30 % — ABNORMAL LOW (ref 39.0–52.0)
Hemoglobin: 10 g/dL — ABNORMAL LOW (ref 13.0–17.0)
Hemoglobin: 10.4 g/dL — ABNORMAL LOW (ref 13.0–17.0)
Hemoglobin: 9.3 g/dL — ABNORMAL LOW (ref 13.0–17.0)
MCH: 28.6 pg (ref 26.0–34.0)
MCHC: 34.2 g/dL (ref 30.0–36.0)
MCV: 82.1 fL (ref 78.0–100.0)
Platelets: 176 10*3/uL (ref 150–400)
Platelets: 73 10*3/uL — ABNORMAL LOW (ref 150–400)
Platelets: 99 10*3/uL — ABNORMAL LOW (ref 150–400)
RBC: 3.26 MIL/uL — ABNORMAL LOW (ref 4.22–5.81)
RBC: 3.49 MIL/uL — ABNORMAL LOW (ref 4.22–5.81)
RBC: 3.65 MIL/uL — ABNORMAL LOW (ref 4.22–5.81)
RDW: 13.1 % (ref 11.5–15.5)
RDW: 13.2 % (ref 11.5–15.5)
WBC: 3.7 10*3/uL — ABNORMAL LOW (ref 4.0–10.5)
WBC: 4.5 10*3/uL (ref 4.0–10.5)
WBC: 4.6 10*3/uL (ref 4.0–10.5)
WBC: 5 10*3/uL (ref 4.0–10.5)

## 2010-08-26 LAB — COMPREHENSIVE METABOLIC PANEL
ALT: 14 U/L (ref 0–53)
AST: 16 U/L (ref 0–37)
Albumin: 3 g/dL — ABNORMAL LOW (ref 3.5–5.2)
Albumin: 3.2 g/dL — ABNORMAL LOW (ref 3.5–5.2)
Alkaline Phosphatase: 60 U/L (ref 39–117)
BUN: 86 mg/dL — ABNORMAL HIGH (ref 6–23)
CO2: 29 mEq/L (ref 19–32)
Calcium: 8.9 mg/dL (ref 8.4–10.5)
Chloride: 102 mEq/L (ref 96–112)
GFR calc Af Amer: 9 mL/min — ABNORMAL LOW (ref >60)
GFR calc non Af Amer: 7 mL/min — ABNORMAL LOW (ref >60)
Glucose, Bld: 105 mg/dL — ABNORMAL HIGH (ref 70–99)
Potassium: 3.9 mEq/L (ref 3.5–5.1)
Potassium: 4 mEq/L (ref 3.5–5.1)
Sodium: 142 mEq/L (ref 135–145)
Total Bilirubin: 0.6 mg/dL (ref 0.3–1.2)
Total Protein: 6.3 g/dL (ref 6.0–8.3)

## 2010-08-26 LAB — MAGNESIUM
Magnesium: 2.2 mg/dL (ref 1.5–2.5)
Magnesium: 2.3 mg/dL (ref 1.5–2.5)

## 2010-08-26 LAB — URINE MICROSCOPIC-ADD ON

## 2010-08-26 LAB — RENAL FUNCTION PANEL
Albumin: 3 g/dL — ABNORMAL LOW (ref 3.5–5.2)
BUN: 75 mg/dL — ABNORMAL HIGH (ref 6–23)
CO2: 25 mEq/L (ref 19–32)
Calcium: 8.2 mg/dL — ABNORMAL LOW (ref 8.4–10.5)
Chloride: 105 mEq/L (ref 96–112)
Creatinine, Ser: 7.95 mg/dL — ABNORMAL HIGH (ref 0.4–1.5)
Creatinine, Ser: 9.14 mg/dL — ABNORMAL HIGH (ref 0.4–1.5)
GFR calc Af Amer: 8 mL/min — ABNORMAL LOW (ref >60)
GFR calc non Af Amer: 7 mL/min — ABNORMAL LOW (ref >60)
Glucose, Bld: 109 mg/dL — ABNORMAL HIGH (ref 70–99)
Potassium: 4 mEq/L (ref 3.5–5.1)
Sodium: 142 mEq/L (ref 135–145)

## 2010-08-26 LAB — CREATININE, URINE, RANDOM: Creatinine, Urine: 45.6 mg/dL

## 2010-08-26 LAB — CK ISOENZYMES
CK-MB: 0 %
CK-MM: 100 % (ref 95–100)
Creatine Kinase-Total: 44 U/L (ref 44–196)

## 2010-08-26 LAB — PHOSPHORUS
Phosphorus: 5.2 mg/dL — ABNORMAL HIGH (ref 2.3–4.6)
Phosphorus: 6 mg/dL — ABNORMAL HIGH (ref 2.3–4.6)
Phosphorus: 6.4 mg/dL — ABNORMAL HIGH (ref 2.3–4.6)

## 2010-08-26 LAB — URIC ACID: Uric Acid, Serum: 11.3 mg/dL — ABNORMAL HIGH (ref 4.0–7.8)

## 2010-08-26 LAB — SODIUM, URINE, RANDOM: Sodium, Ur: 81 meq/L

## 2010-08-26 LAB — LACTATE DEHYDROGENASE: LDH: 192 U/L (ref 94–250)

## 2010-08-27 LAB — CBC
Hemoglobin: 12.6 g/dL — ABNORMAL LOW (ref 13.0–17.0)
MCHC: 34.5 g/dL (ref 30.0–36.0)
RDW: 12.6 % (ref 11.5–15.5)

## 2010-08-27 LAB — CULTURE, BLOOD (ROUTINE X 2)
Culture  Setup Time: 201110150027
Culture: NO GROWTH

## 2010-08-27 LAB — POCT HEMOGLOBIN-HEMACUE: Hemoglobin: 15.9 g/dL (ref 13.0–17.0)

## 2010-08-27 LAB — URINE CULTURE: Culture: NO GROWTH

## 2010-08-27 LAB — SODIUM, URINE, RANDOM: Sodium, Ur: 71 mEq/L

## 2010-09-18 ENCOUNTER — Ambulatory Visit: Payer: Self-pay | Attending: Radiation Oncology | Admitting: Radiation Oncology

## 2010-09-18 LAB — CBC
HCT: 43.5 % (ref 39.0–52.0)
Hemoglobin: 14.8 g/dL (ref 13.0–17.0)
RBC: 5.14 MIL/uL (ref 4.22–5.81)

## 2010-09-18 LAB — BASIC METABOLIC PANEL
Calcium: 9.4 mg/dL (ref 8.4–10.5)
GFR calc Af Amer: >60 mL/min (ref >60)
GFR calc non Af Amer: >60 mL/min (ref >60)
Potassium: 3.6 mEq/L (ref 3.5–5.1)
Sodium: 141 mEq/L (ref 135–145)

## 2010-09-18 LAB — POCT CARDIAC MARKERS
CKMB, poc: 1.3 ng/mL (ref 1.0–8.0)
Myoglobin, poc: 62 ng/mL (ref 12–200)
Troponin i, poc: <0.05 ng/mL (ref 0.00–0.09)

## 2010-09-19 LAB — POCT CARDIAC MARKERS
CKMB, poc: 1.1 ng/mL (ref 1.0–8.0)
Troponin i, poc: <0.05 ng/mL (ref 0.00–0.09)

## 2010-09-19 LAB — DIFFERENTIAL
Basophils Absolute: 0.4 10*3/uL — ABNORMAL HIGH (ref 0.0–0.1)
Lymphocytes Relative: 26 % (ref 12–46)
Lymphs Abs: 2.7 10*3/uL (ref 0.7–4.0)
Monocytes Absolute: 1 10*3/uL (ref 0.1–1.0)
Monocytes Relative: 10 % (ref 3–12)
Neutro Abs: 5.9 10*3/uL (ref 1.7–7.7)

## 2010-09-19 LAB — BASIC METABOLIC PANEL
Calcium: 9.2 mg/dL (ref 8.4–10.5)
GFR calc Af Amer: >60 mL/min (ref >60)
GFR calc non Af Amer: >60 mL/min (ref >60)
Potassium: 3.4 mEq/L — ABNORMAL LOW (ref 3.5–5.1)
Sodium: 142 mEq/L (ref 135–145)

## 2010-09-19 LAB — CBC
Hemoglobin: 15.6 g/dL (ref 13.0–17.0)
RBC: 5.37 MIL/uL (ref 4.22–5.81)
RDW: 13.7 % (ref 11.5–15.5)
WBC: 10.2 10*3/uL (ref 4.0–10.5)

## 2010-09-20 LAB — TROPONIN I: Troponin I: 0.03 ng/mL (ref 0.00–0.06)

## 2010-09-20 LAB — BASIC METABOLIC PANEL
BUN: 14 mg/dL (ref 6–23)
GFR calc Af Amer: >60 mL/min (ref >60)
GFR calc non Af Amer: >60 mL/min (ref >60)
Potassium: 4.4 mEq/L (ref 3.5–5.1)

## 2010-09-20 LAB — CK TOTAL AND CKMB (NOT AT ARMC)
CK, MB: 2.2 ng/mL (ref 0.3–4.0)
Relative Index: INVALID (ref 0.0–2.5)

## 2010-09-20 LAB — CBC
HCT: 44.7 % (ref 39.0–52.0)
Platelets: 197 10*3/uL (ref 150–400)
WBC: 9.4 10*3/uL (ref 4.0–10.5)

## 2010-09-24 ENCOUNTER — Ambulatory Visit: Payer: Medicare Other | Admitting: Anesthesiology

## 2010-10-27 NOTE — Assessment & Plan Note (Signed)
[facility]                         ELECTROPHYSIOLOGY OFFICE NOTE   MAYCOL, HOYING                       MRN:          [id number]  DATE:11/29/2007                            DOB:          Feb 08, 1951    Mr. Laubach returns today for followup after a year absence for an EP  clinic.  He is a very pleasant middle-aged male with a history of well-  controlled hypertension, who has a history of documented SVT, thought to  be atrial tachycardia, which has been present now for several years.  The patient notes that his heart races rarely lasting as much as several  hours though recently it has not raced at all.  He notes occasionally  that he will have the sensation of very hard heartbeat, but otherwise  these have been very stable.  He denies chest pain.  He denies other  symptoms.   MEDICATIONS:  1. MiraLax daily.  2. Baby aspirin 81 mg daily.  3. Wellbutrin XL 300 a day.  4. Rythmol SR 225 twice daily.  5. OxyContin pain medicine.   PHYSICAL EXAMINATION:  GENERAL:  He is a pleasant, well-appearing middle-  aged man in no distress.  VITAL SIGNS:  Blood pressure today was 128/84, pulse 65 and regular,  respirations were 18, and weight was 268 pounds.  NECK:  No jugular venous distention.  LUNGS:  Clear bilaterally to auscultation.  No wheezes, rales, or  rhonchi are present.  There is no increased work of breathing.  CARDIOVASCULAR:  Regular rate and rhythm.  Normal S1 and S2.  No  murmurs, rubs, or gallops.  EXTREMITIES:  Demonstrated no edema.   IMPRESSION:  1. Paroxysmal atrial tachycardia.  2. Borderline hypertension.  3. Chronic Rythmol therapy, secondary to paroxysmal atrial      tachycardia.   DISCUSSION:  Overall, Mr. Kirk is stable.  I have recommended that he  follow up with [place] in a year and we will see him back sooner if he have  worsening symptoms.     Doylene Canning. Ladona Ridgel, MD  Electronically Signed    GWT/MedQ  DD: 11/29/2007   DT: 11/29/2007  Job #: [id number]

## 2010-10-27 NOTE — Assessment & Plan Note (Signed)
[facility]                         ELECTROPHYSIOLOGY OFFICE NOTE   Douglas Waters, Douglas Waters                       MRN:          [id number]  DATE:12/12/2006                            DOB:          Jan 06, 1951    [name] returns today for followup.  He is a very pleasant middle-aged  man with a history of moderate obesity and nonsustained SVT, whom I last  saw back in 2006.  He has been maintained on Rythmol for the last  several years and done quite well with this with very minimal frequency  and duration of his tachy-palpitations and documented SVT.  The patient  has no specific complaints today.   PHYSICAL EXAMINATION:  GENERAL:  He is a pleasant, obese, middle-aged  man in no distress.  VITAL SIGNS:  Blood pressure 133/83, the pulse 56 and regular.  The  respirations were 18, and the weight was 270 pounds.  NECK:  No jugular venous distention.  There was no thyromegaly.  The  trachea was midline.  The carotids are 2+ and symmetric.  LUNGS:  Clear bilaterally to auscultation.  No wheezes, rales, or  rhonchi are present.  CARDIOVASCULAR:  Normal S1 and S2.  There are occasional PACs.   MEDICATIONS:  1. Baby aspirin.  2. Duragesic patch.  3. Rythmol SR 225 1 tab daily.  4. OxyContin p.r.n.  5. Wellbutrin XL 300 daily.   IMPRESSION:  1. Symptomatic palpitations.  2. Brief episodes of supraventricular tachycardia.  3. Obesity.   DISCUSSION:  I have discussed treatment options with [name].  Because  he has done quite well on his Rythmol, I have recommended that he  continue this drug as long as his symptoms are controlled.  We will plan  to see him back in the office in 1 year.     Doylene Canning. Ladona Ridgel, MD  Electronically Signed    GWT/MedQ  DD: 12/12/2006  DT: 12/12/2006  Job #: [id number]

## 2010-10-30 NOTE — Cardiovascular Report (Signed)
   NAME:  Douglas Waters, Douglas Waters                          ACCOUNT NO.:  [id number]   MEDICAL RECORD NO.:  [id number]                   PATIENT TYPE:  INP   LOCATION:  2016                                 FACILITY:  [facility]   PHYSICIAN:  Rollene Rotunda, M.D.                DATE OF BIRTH:  [birth year]   DATE OF PROCEDURE:  12/31/2002  DATE OF DISCHARGE:                              CARDIAC CATHETERIZATION   REASON FOR PRESENTATION:  Evaluate patient with chest pain and  cardiovascular risk factors (786.51.   PROCEDURE NOTE:  Left heart catheterization was performed via the right  femoral artery.  The artery was cannulated using anterior wall puncture.  A  #6-French arterial sheath was inserted via the modified Seldinger technique.  Preformed Judkins and a pigtail catheter were utilized.  The patient  tolerated procedure well and left the laboratory in stable condition.   RESULTS:  HEMODYNAMICS:  LV 143/8, AO 141/75.   CORONARIES:  Left main was normal.   The LAD had proximal 25% stenosis.  There was very large branching first  diagonal.  An inferior branch had 30-40% stenosis.   The circumflex was a somewhat small system.  There was a ramus intermediate  which was normal.  There was a small mid obtuse marginal which was normal.   The right coronary artery was a very large, dominant system.  It was normal  throughout.   LEFT VENTRICULOGRAM:  A left ventriculogram was obtained in the RAO  projection.  EF 60% with normal wall motion.    CONCLUSION:  Minimal coronary artery plaque.   PLAN:  No further cardiac testing was suggested.  The patient will continue  with primary risk reduction.                                               Rollene Rotunda, M.D.    JH/MEDQ  D:  12/31/2002  T:  12/31/2002  Job:  [id number]  Douglas Waters, M.D.  [address], [place]  [place]  [place] [address]  Fax: [phone]   cc:   Douglas Waters, M.D.  [address], [place]  [place]  [place] [address]  Fax:  [phone]

## 2010-10-30 NOTE — Consult Note (Signed)
NAME:  Douglas Waters, Douglas Waters                          ACCOUNT NO.:  [id number]   MEDICAL RECORD NO.:  [id number]                   PATIENT TYPE:  EMS   LOCATION:  MINO                                 FACILITY:  [facility]   PHYSICIAN:  [place] Bing, M.D.               DATE OF BIRTH:  22-Jul-1950   DATE OF CONSULTATION:  01/11/2003  DATE OF DISCHARGE:  01/11/2003                                   CONSULTATION   PRIMARY CARDIOLOGIST:  Dr. Lewayne Bunting.   PRIMARY PHYSICIAN:  Dr. Marlan Palau.   HISTORY OF PRESENT ILLNESS:  A 60 year old gentleman recently admitted to  [facility] with an episode of chest pain and palpitations, now  returns for similar symptoms and documented PSVT.  Douglas Waters describes a  long history of palpitations that previously lasted only a matter of seconds  to minutes.  In recent weeks, longer episodes have occurred, associated with  substantial chest discomfort and dyspnea.  He was admitted to [facility] recently with such an episode and underwent coronary angiography  which revealed no significant obstructive disease.  He was referred back to  his primary physician for consideration of non-cardiac testing and told that  his palpitations could be evaluated if they continued to be troublesome.   This morning, Douglas Waters  noted the sudden onset of tachy palpitations.  He  had some deep chest discomfort that was more mild than the symptoms he  experienced in the past.  There was associated dyspnea and diaphoresis but  no lightheadedness.  He may have had some mild nausea.  After some hours, he  came to the emergency department where PSVT at a rate of 170 was documented.  He failed to respond to 6 and then 12 mg of intravenous adenosine.  Intravenous diltiazem  was subsequently administered at a dose of 20 mg.  His heart rate slowed and then he converted to sinus rhythm.   PAST MEDICAL HISTORY:  Notable for surgery for lumbar spine disease on 2  occasions.  Chronic pain persists.  The patient has also had an appendectomy  in the remote past.  There is a history of depression.   ALLERGIES:  The patient has no known drug allergies.   MEDICATIONS AT DISCHARGE EARLIER THIS MONTH:  Wellbutrin, MiraLax, Duragesic  transdermally, and aspirin.   SOCIAL HISTORY:  Lives in [place] with is wife; disabled; 4 children; no  history of tobacco use nor excessive alcohol use.   FAMILY HISTORY:  Positive for coronary disease -- father suffered a  myocardial infarction at age 62.   REVIEW OF SYSTEMS:  All negative except as noted above.   PHYSICAL EXAMINATION:  Pleasant overweight gentleman in no acute distress.  Heart rate is 80 and regular, respirations 20, blood pressure 140/90.  HEENT:  Anicteric sclerae.  NECK:  No jugular venous distention.  No bruits.  ENDOCRINE:  No thyromegaly.  HEMATOPOIETIC:  No adenopathy.  SKIN:  No significant lesions.  LUNGS:  Clear.  CARDIAC:  Fourth heart sound present.  ABDOMEN:  Soft and nontender; no organomegaly,  EXTREMITIES:  No edema; normal distal pulses.  NEUROMUSCULAR:  Symmetric strength and tone.   LABORATORIES:  EKG following conversion:  Sinus bradycardia; otherwise  within normal limits.  Additional laboratory includes a normal CBC, normal chemistry profile,  normal MB fraction, normal myoglobin, minimally elevated Troponin at 0.1.   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  His symptoms are fairly impressive, but not life  threatening.  We plan to treat him with Diltiazem 240 mg per day in  additional to his usual medicines.  Should be experience a recurrent, he  will call our office.  Otherwise, follow up with one of our  electrophysiologists for consideration of radiofrequency ablation will be  scheduled.  The patient also has mild coronary disease with a positive  family history.  Although Lipitor follow-up is relatively favorable,   treatment with statins is a potential benefit.  Zocor 40 mg daily  will be  added to his regimen.                                                [place] Bing, M.D.    RR/MEDQ  D:  01/11/2003  T:  01/11/2003  Job:  [id number]

## 2010-10-30 NOTE — Discharge Summary (Signed)
NAME:  Douglas Waters, Douglas Waters                          ACCOUNT NO.:  [id number]   MEDICAL RECORD NO.:  [id number]                   PATIENT TYPE:  INP   LOCATION:  2399                                 FACILITY:  [facility]   PHYSICIAN:  Rollene Rotunda, M.D.                DATE OF BIRTH:  08-21-1950   DATE OF ADMISSION:  12/29/2002  DATE OF DISCHARGE:  12/31/2002                           DISCHARGE SUMMARY - REFERRING   SUMMARY OF HISTORY:  Douglas Waters is a 60 year old white male who presented to  the hospital with chest discomfort that he describes as a ton of bricks. He  has had recurring episodes that initially started approximately two weeks  ago at rest and usually last a little over an hour. 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 On the evening of admission, he had another  episode that prompted his presentation to the emergency room for further  evaluation. These episodes have been associated with nausea, diaphoresis,  and shortness of breath. EMS had given him two sublingual nitroglycerins  with relief of his symptoms, and he was felt to be pain free. His medical  history is notable for a shrimp allergy but no known contrast dye allergy.  He has chronic back discomfort with known herniated lumbar disk and is  status post surgeries on two prior occasions and depression.   LABORATORY DATA:  TSH on July 17 was 0.40. Fasting lipids showed a total  cholesterol of 173, triglycerides 88, HDL 49, LDL 106. CKs and troponins  were negative for myocardial infarction. Chemistries showed sodium of 136,  potassium 3.9, BUN 18, creatinine 1.0. Normal LFTs. Total bilirubin was  slightly elevated at 2.4. Subsequent chemistries were unremarkable.  Admission PT was 12.9, PTT 67, H and H 13.1 and 37.5, MCV 77.8, MCH 35.1,  platelets 190, WBCs 8.8. Subsequent hematologies were unremarkable. Chest x-  ray showed decreased lung  volumes with bibasilar atelectasis. EKG showed  sinus bradycardia, normal axis.   HOSPITAL COURSE:  Douglas Waters was initially admitted to the unit 2000. He was  placed on IV heparin as well as his home medications. Overnight, he did not  have any further chest discomfort, and enzymes and EKGs were negative for  myocardial infarction. It was felt that he should undergo cardiac  catheterization for further delineation of his chest discomfort.  Catheterization was performed on July 19 by Dr. Antoine Poche. According to Dr.  Jenene Slicker progress note, he had a 25% LAD, a 30 to 40% inferior branch off  the diagonal 1, and an EF of 60%. Post sheath removal and bedrest,  catheterization site was intact, and he was ambulating without difficulty.  Dr. Antoine Poche felt that he could be discharged home.   DISCHARGE DIAGNOSES:  1. Noncardiac chest discomfort.  2. Nonobstructive coronary artery disease.  3. Chronic back discomfort.  4. History of depression.   DISPOSITION:  Douglas Waters is discharged home. He was offered a trial  prescription for a H2 blocker; however, the patient declined this. He was  asked to continue his Duragesic patch 0.1 mcg q.48h., hydrocodone as needed  previously, Wellbutrin 150 mg q.d., MiraLax 17 mg q.d., baby aspirin 80 mg  q.d. He was advised no lifting, driving, sexual activity, or heavy exertion  for two days. Maintain low salt, fat and cholesterol diet. If he has any  problems  with his catheterization site, he was asked to call our office immediately.  He was also asked to arrange a two-week appointment with Dr. Lenord Fellers for  followup to research different etiologies of chest discomfort if his  symptoms should persist.     Joellyn Rued, P.A. [facility]                    Rollene Rotunda, M.D.    EW/MEDQ  D:  12/31/2002  T:  12/31/2002  Job:  [id number]   cc:   Luanna Cole. Lenord Fellers, M.D.  [address] [place]  [place]  [place] [address]  Fax: [phone]    cc:   Luanna Cole. Lenord Fellers, M.D.  [address], [place]  [place]  [place] [address]  Fax: [phone]

## 2010-10-30 NOTE — H&P (Signed)
NAME:  Douglas Waters, Douglas Waters                          ACCOUNT NO.:  [id number]   MEDICAL RECORD NO.:  [id number]                   PATIENT TYPE:  INP   LOCATION:  2016                                 FACILITY:  [facility]   PHYSICIAN:  Jonelle Sidle, M.D. [facility]        DATE OF BIRTH:  27-Jun-1950   DATE OF ADMISSION:  12/29/2002  DATE OF DISCHARGE:                                HISTORY & PHYSICAL   PRIMARY CARE PHYSICIAN:  Luanna Cole. Lenord Fellers, M.D.   CHIEF COMPLAINT:  Chest pain.   HISTORY OF PRESENT ILLNESS:  Mr. Ostermiller is a pleasant 60 year old male with a  history of depression and back pain due to herniated lumbar disks.  He has  no known cardiovascular disease.  He has had recurrent episodes of chest  pressure described as a ton on bricks that initially occurred  approximately two weeks ago at rest and lasted for a little over an hour.  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  He had recurrent pain later  that evening and ultimately presented to the emergency department for  further assessment.  Associated with these episodes have been diaphoresis,  nausea, and dyspnea.  He did receive two sublingual nitroglycerin per EMS  with relief of symptoms and at this point is chest pain-free.  He has  undergone no prior risk stratification based on history.  Lipid status is  unknown.  He is not treated for type 2 diabetes mellitus or hypertension.  His mother had a myocardial infarction at age 10.   ALLERGIES:  No known drug allergies.  He does have a SHRIMP allergy reported  as nausea and emesis.  He does eat oysters.  He has no known contrast dye  allergy.   MEDICATIONS:  Medications prior to arrival included:  1. Duragesic patch 0.1 mcg q.48h.  2. Hydrocodone p.r.n.  3. Wellbutrin 150 mg p.o. daily.  4. MiraLax 17 mg p.o. daily.   PAST MEDICAL HISTORY:  1. History of herniated lumbar disk, status post prior  surgery on two     occasions.  2. Status post appendectomy.  3. Depression.   SOCIAL HISTORY:  The patient is married and has four children.  [place] and is disabled.  He has no history of tobacco or  alcohol use.   FAMILY HISTORY:  Significant in the fact that the patient's mother had a  myocardial infarction at age 27.   REVIEW OF SYSTEMS:  As described in the history of present illness.  He also  describes occasional brief palpitations that he has noted for several years,  but these have been extremely infrequent and there is no history of syncope.  He has noted with the above-described chest discomfort episodes of some  palpitations as well.   PHYSICAL EXAMINATION:  VITAL SIGNS:  The blood pressure  is 138/80, the heart  rate is in the 60s and regular, respirations 20, the temperature is 97.3  degrees, and the oxygen saturation is 95% on room air.  GENERAL APPEARANCE:  This is a well-nourished male in no acute distress.  He  is complaining of a headache.  HEENT:  Conjunctivae and lids normal.  Oropharynx clear.  NECK:  Supple without elevated jugular venous pressure or carotid bruits.  No thyromegaly is noted.  LUNGS:  Clear to auscultation bilaterally with no wheezing or rhonchi.  Respirations are not labored at rest.  CARDIAC:  Regular rate and rhythm without S3, gallop, or murmur.  ABDOMEN:  Soft without hepatosplenomegaly or bruits.  EXTREMITIES:  No cyanosis, clubbing, or edema.  Peripheral pulses are 2+.  SKIN:  No ulcer or changes noted.  MUSCULOSKELETAL:  No kyphosis.  NEUROPSYCHIATRIC:  The patient is alert and oriented x 3.   LABORATORY DATA:  The chest x-ray is currently pending.   The 12-lead electrocardiogram shows sinus bradycardia at 56 beats per minute  with nonspecific ST changes.   Hemoglobin 15.  Sodium 136, potassium 3.9, BUN 18, creatinine 1.0, glucose  115.  Troponin I less than 0.05.  BNP 63.7.  INR 1.0.    IMPRESSION:  1. Recurrent chest pain concerning for unstable angina in a 60 year old     male.  Cardiac markers initially are negative and 12-lead     electrocardiogram is nonspecific.  The patient has achieved relief     following nitroglycerin.  He has had no prior risk stratification.  2. Unknown lipid status.  3. History of depression.  4. History of chronic back pain with herniated lumbar disk, status post     prior surgery.   PLAN:  1. Will admit the patient to telemetry and continue to cycle cardiac     markers.  2. Will institute heparin and aspirin and use nitroglycerin as needed.  3. Check fasting lipid profile.  4. I discussed further diagnostic options with the patient and his wife.     After reviewing the risks and benefits, they have elected to proceed with     diagnostic coronary angiography to clearly outline coronary anatomy.                                               Jonelle Sidle, M.D. [facility]    SGM/MEDQ  D:  12/29/2002  T:  12/29/2002  Job:  [id number]

## 2010-10-30 NOTE — Assessment & Plan Note (Signed)
[facility]                            CARDIOLOGY OFFICE NOTE   Douglas Waters, Douglas Waters                       MRN:          [id number]  DATE:11/29/2007                            DOB:          11-Dec-1950    The patient is treated for his arrhythmias with Rythmol.  At one point  he was changed to generic.  He is feeling poorly.  He is noting  palpitations.  He probably wants to go back on the brand name Rythmol.   I am in agreement with this and we will be contacting his pharmacy to  put him back on brand name Rythmol.     [name], MD, [facility]  Electronically Signed    JDK/MedQ  DD: 12/14/2007  DT: 12/14/2007  Job #: [id number]

## 2010-11-18 ENCOUNTER — Ambulatory Visit: Payer: Medicare Other | Admitting: Pain Medicine

## 2011-01-12 ENCOUNTER — Ambulatory Visit (HOSPITAL_COMMUNITY)
Admission: RE | Admit: 2011-01-12 | Discharge: 2011-01-12 | Disposition: A | Discharge status: Home / Self Care | Payer: Medicare Other | Source: Ambulatory Visit | Attending: Oncology | Admitting: Oncology

## 2011-01-12 ENCOUNTER — Other Ambulatory Visit: Payer: Self-pay | Admitting: Oncology

## 2011-01-12 ENCOUNTER — Encounter (HOSPITAL_BASED_OUTPATIENT_CLINIC_OR_DEPARTMENT_OTHER): Payer: Medicare Other | Admitting: Oncology

## 2011-01-12 DIAGNOSIS — R1310 Dysphagia, unspecified: Secondary | ICD-10-CM | POA: Insufficient documentation

## 2011-01-12 DIAGNOSIS — R490 Dysphonia: Secondary | ICD-10-CM | POA: Insufficient documentation

## 2011-01-12 DIAGNOSIS — M47812 Spondylosis without myelopathy or radiculopathy, cervical region: Secondary | ICD-10-CM | POA: Insufficient documentation

## 2011-01-12 DIAGNOSIS — E46 Unspecified protein-calorie malnutrition: Secondary | ICD-10-CM

## 2011-01-12 DIAGNOSIS — C099 Malignant neoplasm of tonsil, unspecified: Secondary | ICD-10-CM | POA: Insufficient documentation

## 2011-01-12 LAB — CMP (CANCER CENTER ONLY)
ALT(SGPT): 18 U/L (ref 10–47)
AST: 18 U/L (ref 11–38)
Albumin: 3.4 g/dL (ref 3.3–5.5)
Calcium: 9.4 mg/dL (ref 8.0–10.3)
Chloride: 99 mEq/L (ref 98–108)
Potassium: 4.8 mEq/L — ABNORMAL HIGH (ref 3.3–4.7)

## 2011-01-12 LAB — CBC WITH DIFFERENTIAL/PLATELET
BASO%: 0.1 % (ref 0.0–2.0)
EOS%: 3.3 % (ref 0.0–7.0)
MCH: 29.4 pg (ref 27.2–33.4)
MCHC: 35.3 g/dL (ref 32.0–36.0)
RDW: 13.8 % (ref 11.0–14.6)
lymph#: 0.5 10*3/uL — ABNORMAL LOW (ref 0.9–3.3)

## 2011-01-12 MED ORDER — IOHEXOL 300 MG/ML  SOLN
100.0000 mL | Freq: Once | INTRAMUSCULAR | Status: AC | PRN
  Administered 2011-01-12: 100 mL via INTRAVENOUS

## 2011-01-13 ENCOUNTER — Ambulatory Visit (HOSPITAL_COMMUNITY)
Admission: RE | Admit: 2011-01-13 | Discharge: 2011-01-13 | Disposition: A | Discharge status: Home / Self Care | Payer: Medicare Other | Source: Ambulatory Visit | Attending: Oncology | Admitting: Oncology

## 2011-01-13 ENCOUNTER — Other Ambulatory Visit: Payer: Self-pay | Admitting: Oncology

## 2011-01-13 ENCOUNTER — Encounter (HOSPITAL_BASED_OUTPATIENT_CLINIC_OR_DEPARTMENT_OTHER): Payer: Medicare Other | Admitting: Oncology

## 2011-01-13 DIAGNOSIS — C090 Malignant neoplasm of tonsillar fossa: Secondary | ICD-10-CM

## 2011-01-13 DIAGNOSIS — R222 Localized swelling, mass and lump, trunk: Secondary | ICD-10-CM

## 2011-01-13 DIAGNOSIS — Z85819 Personal history of malignant neoplasm of unspecified site of lip, oral cavity, and pharynx: Secondary | ICD-10-CM | POA: Insufficient documentation

## 2011-01-13 DIAGNOSIS — J984 Other disorders of lung: Secondary | ICD-10-CM | POA: Insufficient documentation

## 2011-01-25 ENCOUNTER — Encounter: Payer: Self-pay | Admitting: Internal Medicine

## 2011-01-26 ENCOUNTER — Ambulatory Visit (INDEPENDENT_AMBULATORY_CARE_PROVIDER_SITE_OTHER): Payer: Medicare Other | Admitting: Internal Medicine

## 2011-01-26 ENCOUNTER — Encounter: Payer: Self-pay | Admitting: Internal Medicine

## 2011-01-26 DIAGNOSIS — R071 Chest pain on breathing: Secondary | ICD-10-CM

## 2011-01-26 DIAGNOSIS — W3400XA Accidental discharge from unspecified firearms or gun, initial encounter: Secondary | ICD-10-CM

## 2011-01-26 DIAGNOSIS — F17200 Nicotine dependence, unspecified, uncomplicated: Secondary | ICD-10-CM

## 2011-01-26 DIAGNOSIS — R0789 Other chest pain: Secondary | ICD-10-CM

## 2011-01-26 DIAGNOSIS — G8929 Other chronic pain: Secondary | ICD-10-CM

## 2011-01-26 DIAGNOSIS — F411 Generalized anxiety disorder: Secondary | ICD-10-CM

## 2011-01-26 DIAGNOSIS — F419 Anxiety disorder, unspecified: Secondary | ICD-10-CM

## 2011-01-26 DIAGNOSIS — I2510 Atherosclerotic heart disease of native coronary artery without angina pectoris: Secondary | ICD-10-CM

## 2011-01-26 DIAGNOSIS — Z720 Tobacco use: Secondary | ICD-10-CM

## 2011-01-26 DIAGNOSIS — S31139A Puncture wound of abdominal wall without foreign body, unspecified quadrant without penetration into peritoneal cavity, initial encounter: Secondary | ICD-10-CM

## 2011-01-26 DIAGNOSIS — M549 Dorsalgia, unspecified: Secondary | ICD-10-CM

## 2011-01-26 DIAGNOSIS — C099 Malignant neoplasm of tonsil, unspecified: Secondary | ICD-10-CM

## 2011-01-26 DIAGNOSIS — S31109A Unspecified open wound of abdominal wall, unspecified quadrant without penetration into peritoneal cavity, initial encounter: Secondary | ICD-10-CM

## 2011-02-08 ENCOUNTER — Ambulatory Visit: Payer: Medicare Other | Admitting: Pain Medicine

## 2011-02-13 ENCOUNTER — Encounter: Payer: Self-pay | Admitting: Internal Medicine

## 2011-02-13 DIAGNOSIS — W3400XA Accidental discharge from unspecified firearms or gun, initial encounter: Secondary | ICD-10-CM | POA: Insufficient documentation

## 2011-02-13 DIAGNOSIS — I2510 Atherosclerotic heart disease of native coronary artery without angina pectoris: Secondary | ICD-10-CM | POA: Insufficient documentation

## 2011-02-13 DIAGNOSIS — C099 Malignant neoplasm of tonsil, unspecified: Secondary | ICD-10-CM | POA: Insufficient documentation

## 2011-02-13 DIAGNOSIS — Z720 Tobacco use: Secondary | ICD-10-CM | POA: Insufficient documentation

## 2011-02-13 DIAGNOSIS — S31139A Puncture wound of abdominal wall without foreign body, unspecified quadrant without penetration into peritoneal cavity, initial encounter: Secondary | ICD-10-CM | POA: Insufficient documentation

## 2011-02-13 DIAGNOSIS — F419 Anxiety disorder, unspecified: Secondary | ICD-10-CM | POA: Insufficient documentation

## 2011-02-13 DIAGNOSIS — G8929 Other chronic pain: Secondary | ICD-10-CM | POA: Insufficient documentation

## 2011-02-13 NOTE — Progress Notes (Signed)
  Subjective:    Patient ID: Douglas Waters, male    DOB: [birth year], 60 y.o.   MRN: [id number]  HPI  60 year old white male with history of chronic back pain for many years, coronary artery disease, history of supraventricular tachycardia status post lumbar hemilaminectomy L5-S1 July 1992 by Dr. Rosalie Doctor. In June 1995 he had an MRI of the lumbar spine showing some postoperative changes at L5-S1 with mild bulge, discogenic edema on the right at L5 and degenerative disc disease with a bulge at L4-L5 but no frank herniated disc. In September 1995 he had an MRI of thoracic spine which was negative. In November 1995 he went to the [facility] of pain management in [place] where he had epidural steroid injections. He also had facet joint injections L4-L5 in January 1996. Subsequently has had issues with chronic pain in his been to [facility] to see Dr. Tresa Endo in April 2000 was diagnosed with degenerative disc. He saw Dr. Modesto Charon at [facility] neurosurgical clinic [phone] and was given a similar diagnosis. I sent him to [facility] division of neurosurgery in 2001. He was tried on Neurontin and trazodone and these did not help. Patient was continuing to have severe chronic low back pain and right leg pain. A spinal cord stimulator was discussed with the patient and he declined. History of depression maintained a Wellbutrin. Patient had to apply for disability because of back issues. 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 History of chewing tobacco.  Had cardiac catheterization 2004 showing 825% proximal LAD stenosis. 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 Sharrell Ku. Today he's noticed a nodule in his mid lower chest that he's concerned about that is tender.    Review of Systems     Objective:   Physical Exam neck is supple ; chest clear;  cardiac exam regular rate and rhythm, normal S1 and S2. Xiphoid process is tender to palpation.        Assessment & Plan:  Patient's area of concern is actually his xiphoid process. He was reassured that this is a normal part of the anatomy. Return when necessary.

## 2011-02-27 IMAGING — US IR FLUORO GUIDE CV LINE*R*
1 series · 1 of 1 positions shown · non-contrast
Comparison: none

CLINICAL DATA: Acute renal failure, access for temporary dialysis

[Series 1: sp us guide vasc access*right* · 1 of 1 slices shown]
[im 1/1]
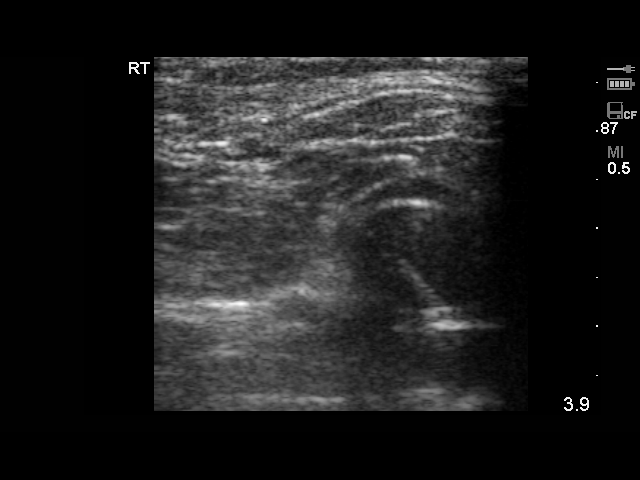

[1 of 1 positions shown; findings below may reference images not displayed]

ULTRASOUND GUIDANCE FOR VASCULAR ACCESS
TEMPORARY RIGHT IJ DIALYSIS CATHETER

Date:  03/30/2010 [DATE]

Radiologist:  Mirka Maya Burcus, M.D.

Medications:  1% lidocaine locally

Guidance:  Ultrasound fluoroscopic

Fluoroscopy time:  0.9 minutes

Sedation time:  None.

Contrast volume:  None.

Complications:  No immediate

PROCEDURE/FINDINGS:

Informed consent was obtained from the patient following
explanation of the procedure, risks, benefits and alternatives.
The patient understands, agrees and consents for the procedure.
All questions were addressed.  A time out was performed.

Maximal barrier sterile technique utilized including caps, mask,
sterile gowns, sterile gloves, large sterile drape, hand hygiene,
and betadine

Under sterile conditions and local anesthesia, right IJ
micropuncture venous access was performed.  Images obtained for
documentation.  Guide wire advanced centrally.  A 4-French dilator
set was advanced.  Guide wire exchange for an Amplatz guide wire.
Measurements were obtained for the appropriate length.  Over the
guide wire, a 13-French triple-lumen trialysis catheter was
advanced with the tip positioned in the SVC RA junction.  Blood was
aspirated easily followed by saline flushes and the appropriate
volume and strength of heparin.  Catheter secured with Prolene
sutures.  Sterile dressing applied.  Caps applied.  No immediate
complication.  The patient tolerated the procedure well.
IMPRESSION: Ultrasound and fluoroscopically guided right IJ temporary trialysis
catheter ready for use.

## 2011-03-22 ENCOUNTER — Ambulatory Visit (INDEPENDENT_AMBULATORY_CARE_PROVIDER_SITE_OTHER): Payer: Medicare Other | Admitting: Internal Medicine

## 2011-03-22 DIAGNOSIS — Z23 Encounter for immunization: Secondary | ICD-10-CM

## 2011-04-16 ENCOUNTER — Ambulatory Visit
Admission: RE | Admit: 2011-04-16 | Discharge: 2011-04-16 | Disposition: A | Discharge status: Home / Self Care | Payer: Medicare Other | Source: Ambulatory Visit | Attending: Radiation Oncology | Admitting: Radiation Oncology

## 2011-04-19 NOTE — Progress Notes (Signed)
CC:   Exie Parody, M.D. Kristine Garbe. Ezzard Standing, M.D.  DIAGNOSIS:  Squamous cell carcinoma of the left tonsil, T1 N1 M0.  INTERVAL HISTORY:  Mr. Kolbeck returns to clinic today for followup.  He completed his course of radiation approximately 1 year ago.  The patient states that he has continued to slowly improve in terms of some symptoms.  He notes that his taste has continued to slowly improve but is not back at baseline level, and this similarly was the case for xerostomia, also.  The patient denies any difficulties with swallowing at this time or pain in the head and neck region.  PHYSICAL EXAMINATION:  Weight:  211.4 pounds.  Vital Signs:  Temperature 96.8, pulse 54, respiratory 18, blood pressure 129/71.  General:  Well- developed male in no acute distress.  HEENT:  Extraocular movements intact.  Oral cavity clear with no lesions present.  Both the oral cavity and oropharynx are clear on visual inspection and on mirror exam. Neck:  Supple without any lymphadenopathy.  There was some radiation change in terms of some hyperpigmentation and fibrosis.  Cardiovascular: Regular rate and rhythm.  Respiratory:  Clear to auscultation.  GI: Abdomen is soft, nontender, normal bowel sounds.  Extremities:  No edema present.  IMPRESSION AND PLAN:  Mr. Garofano has done well with no evidence thus far of any recurrent or progressive disease.  He has continued to improve slowly in terms of his symptoms and does not have any new issues at this time.  I am going to stagger visits with his other providers, and I will have him return to clinic in April 2013.  I spent 15 minutes with Mr. Flenner today, the majority of which was spent counseling him on his diagnosis and coordinating his care.    ______________________________ Radene Gunning, M.D., Ph.D. JSM/MEDQ  D:  04/16/2011  T:  04/19/2011  Job:  [id number]

## 2011-05-03 ENCOUNTER — Ambulatory Visit: Payer: Medicare Other | Admitting: Pain Medicine

## 2011-06-14 ENCOUNTER — Telehealth: Payer: Self-pay | Admitting: Oncology

## 2011-06-14 NOTE — Telephone Encounter (Signed)
called pt and informed him of appts in jan2013

## 2011-06-15 HISTORY — PX: COLONOSCOPY: SHX174

## 2011-06-15 HISTORY — PX: ESOPHAGEAL DILATION: SHX303

## 2011-06-27 ENCOUNTER — Encounter: Payer: Self-pay | Admitting: Oncology

## 2011-07-02 ENCOUNTER — Encounter: Payer: Self-pay | Admitting: *Deleted

## 2011-07-13 ENCOUNTER — Ambulatory Visit (HOSPITAL_COMMUNITY)
Admission: RE | Admit: 2011-07-13 | Discharge: 2011-07-13 | Disposition: A | Discharge status: Home / Self Care | Payer: Medicare Other | Source: Ambulatory Visit | Attending: Oncology | Admitting: Oncology

## 2011-07-13 ENCOUNTER — Other Ambulatory Visit (HOSPITAL_BASED_OUTPATIENT_CLINIC_OR_DEPARTMENT_OTHER): Payer: Medicare Other

## 2011-07-13 DIAGNOSIS — Z923 Personal history of irradiation: Secondary | ICD-10-CM | POA: Insufficient documentation

## 2011-07-13 DIAGNOSIS — E46 Unspecified protein-calorie malnutrition: Secondary | ICD-10-CM

## 2011-07-13 DIAGNOSIS — C099 Malignant neoplasm of tonsil, unspecified: Secondary | ICD-10-CM | POA: Insufficient documentation

## 2011-07-13 DIAGNOSIS — C090 Malignant neoplasm of tonsillar fossa: Secondary | ICD-10-CM

## 2011-07-13 DIAGNOSIS — R5383 Other fatigue: Secondary | ICD-10-CM

## 2011-07-13 DIAGNOSIS — Z9221 Personal history of antineoplastic chemotherapy: Secondary | ICD-10-CM | POA: Insufficient documentation

## 2011-07-13 LAB — CBC WITH DIFFERENTIAL/PLATELET
BASO%: 0.3 % (ref 0.0–2.0)
Basophils Absolute: 0 10*3/uL (ref 0.0–0.1)
HCT: 40.6 % (ref 38.4–49.9)
HGB: 13.8 g/dL (ref 13.0–17.1)
MCHC: 33.9 g/dL (ref 32.0–36.0)
MONO#: 0.4 10*3/uL (ref 0.1–0.9)
NEUT%: 71.3 % (ref 39.0–75.0)
WBC: 3.7 10*3/uL — ABNORMAL LOW (ref 4.0–10.3)
lymph#: 0.5 10*3/uL — ABNORMAL LOW (ref 0.9–3.3)

## 2011-07-13 LAB — CMP (CANCER CENTER ONLY)
Albumin: 3.8 g/dL (ref 3.3–5.5)
Alkaline Phosphatase: 86 U/L — ABNORMAL HIGH (ref 26–84)
BUN, Bld: 15 mg/dL (ref 7–22)
CO2: 32 mEq/L (ref 18–33)
Calcium: 9.5 mg/dL (ref 8.0–10.3)
Glucose, Bld: 104 mg/dL (ref 73–118)
Potassium: 4.7 mEq/L (ref 3.3–4.7)

## 2011-07-13 LAB — TSH: TSH: 2.428 u[IU]/mL (ref 0.350–4.500)

## 2011-07-13 MED ORDER — IOHEXOL 300 MG/ML  SOLN
100.0000 mL | Freq: Once | INTRAMUSCULAR | Status: AC | PRN
  Administered 2011-07-13: 100 mL via INTRAVENOUS

## 2011-07-14 ENCOUNTER — Telehealth: Payer: Self-pay | Admitting: Oncology

## 2011-07-14 ENCOUNTER — Ambulatory Visit (HOSPITAL_BASED_OUTPATIENT_CLINIC_OR_DEPARTMENT_OTHER): Payer: Medicare Other | Admitting: Oncology

## 2011-07-14 ENCOUNTER — Ambulatory Visit: Payer: Medicare Other | Admitting: Oncology

## 2011-07-14 DIAGNOSIS — M545 Low back pain, unspecified: Secondary | ICD-10-CM

## 2011-07-14 DIAGNOSIS — Z85810 Personal history of malignant neoplasm of tongue: Secondary | ICD-10-CM

## 2011-07-14 DIAGNOSIS — G8929 Other chronic pain: Secondary | ICD-10-CM

## 2011-07-14 DIAGNOSIS — R5383 Other fatigue: Secondary | ICD-10-CM
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
## 2011-07-14 NOTE — Telephone Encounter (Signed)
appts made and printed for lab ct and md  For July     aom

## 2011-08-02 ENCOUNTER — Ambulatory Visit: Payer: Self-pay | Admitting: Pain Medicine

## 2011-09-16 ENCOUNTER — Encounter: Payer: Self-pay | Admitting: *Deleted

## 2011-09-16 ENCOUNTER — Encounter: Payer: Self-pay | Admitting: Oncology

## 2011-09-16 DIAGNOSIS — Z923 Personal history of irradiation: Secondary | ICD-10-CM | POA: Insufficient documentation

## 2011-09-17 ENCOUNTER — Ambulatory Visit: Payer: Medicare Other | Admitting: Radiation Oncology

## 2011-09-24 ENCOUNTER — Ambulatory Visit: Payer: Medicare Other | Admitting: Radiation Oncology

## 2011-10-08 ENCOUNTER — Encounter: Payer: Self-pay | Admitting: Radiation Oncology

## 2011-10-08 ENCOUNTER — Ambulatory Visit
Admission: RE | Admit: 2011-10-08 | Discharge: 2011-10-08 | Disposition: A | Discharge status: Home / Self Care | Payer: Medicare Other | Source: Ambulatory Visit | Attending: Radiation Oncology | Admitting: Radiation Oncology

## 2011-10-08 VITALS — BP 121/71 | HR 58 | Temp 97.8°F | Resp 20 | Wt 227.5 lb

## 2011-10-08 DIAGNOSIS — C090 Malignant neoplasm of tonsillar fossa: Secondary | ICD-10-CM

## 2011-10-08 NOTE — Progress Notes (Signed)
  Radiation Oncology         [phone] ________________________________  Name: Douglas Waters MRN: [id number]  Date: 10/08/2011  DOB: Sep 30, 1950  Follow-Up Visit Note  CC: No primary provider on file.  Dillard Cannon E, *  Diagnosis:   Squamous carcinoma of the left tonsil, T1 N1 M0  Interval Since Last Radiation:  Approximately 18 months   Narrative:  The patient returns today for routine follow-up.  He indicates that he does continue to experience some xerostomia. This is moderate and he believes that this has probably been improving. Some days this is better than others. He denies any significant dysphagia or odynophagia although he feels that he does have some food which gets stuck in the mid esophagus region and he is being set up to see a GI doctor for this. The patient relates no new problems in the head neck area. He is still seeing Dr. Ezzard Standing on a regular basis. The patient did have a CT scan of the neck on 07/13/2011 and this did not reveal any suspicious findings for recurrent or progressive disease.                              ALLERGIES:  is allergic to shrimp.  Meds: Current Outpatient Prescriptions  Medication Sig Dispense Refill  . fentaNYL (DURAGESIC - DOSED MCG/HR) 25 MCG/HR Place 1 patch onto the skin every 3 (three) days.        Marland Kitchen HYDROcodone-acetaminophen (VICODIN ES) 7.5-750 MG per tablet Take 1 tablet by mouth every 6 (six) hours as needed.        . polyethylene glycol (MIRALAX / GLYCOLAX) packet Take 17 g by mouth daily.          Physical Findings: The patient is in no acute distress. Patient is alert and oriented.  weight is 227 lb 8 oz (103.193 kg). His oral temperature is 97.8 F (36.6 C). His blood pressure is 121/71 and his pulse is 58. His respiration is 20. Marland Kitchen   General: Well-developed, in no acute distress HEENT: Normocephalic, atraumatic; oral cavity clear, no suspicious findings within the oropharynx on direct visualization or beer exam Neck:  Supple without any lymphadenopathy Cardiovascular: Regular rate and rhythm Respiratory: Clear to auscultation bilaterally GI: Soft, nontender, normal bowel sounds Extremities: No edema present Neuro: No focal deficits    Lab Findings: Lab Results  Component Value Date   WBC 3.7* 07/13/2011   HGB 13.8 07/13/2011   HCT 40.6 07/13/2011   MCV 82.3 07/13/2011   PLT 150 07/13/2011     Radiographic Findings: No results found.  Impression:    Pleasant male who remains clinically NED for his left-sided tonsillar cancer.  Plan:  He'll followup with me in 6 months.     Radene Gunning, M.D., Ph.D.     I spent 10 minutes with the patient today, the majority of which was spent counseling the patient on the diagnosis of cancer and coordinating care.

## 2011-10-08 NOTE — Progress Notes (Signed)
Pt states mouth stays moderately dry. He has difficulty with foods getting stuck mid esophagus. Pt states he had this issue prior to radiation tx. Pt saw Dr Ezzard Standing who states he needs to see gastroenterologist. Pt has not returned to PCP for this referral. He has modified his diet, eating habits. Pt has chronic back pain, denies pain in mouth, throat.

## 2011-11-30 ENCOUNTER — Ambulatory Visit: Payer: Self-pay | Admitting: Pain Medicine

## 2011-12-03 ENCOUNTER — Other Ambulatory Visit: Payer: Self-pay | Admitting: Gastroenterology

## 2011-12-03 DIAGNOSIS — R1310 Dysphagia, unspecified: Secondary | ICD-10-CM

## 2011-12-07 ENCOUNTER — Ambulatory Visit
Admission: RE | Admit: 2011-12-07 | Discharge: 2011-12-07 | Disposition: A | Discharge status: Home / Self Care | Payer: Medicare Other | Source: Ambulatory Visit | Attending: Gastroenterology | Admitting: Gastroenterology

## 2011-12-07 DIAGNOSIS — R1310 Dysphagia, unspecified: Secondary | ICD-10-CM

## 2011-12-27 ENCOUNTER — Telehealth: Payer: Self-pay | Admitting: Oncology

## 2011-12-27 NOTE — Telephone Encounter (Signed)
l/m for pt to call as he had called to r/s an appt  aom

## 2011-12-28 ENCOUNTER — Telehealth: Payer: Self-pay | Admitting: Oncology

## 2011-12-28 NOTE — Telephone Encounter (Signed)
appts made and r/s per wife,s/w her today   aom

## 2012-01-06 ENCOUNTER — Other Ambulatory Visit: Payer: Self-pay | Admitting: Gastroenterology

## 2012-01-07 ENCOUNTER — Other Ambulatory Visit (HOSPITAL_BASED_OUTPATIENT_CLINIC_OR_DEPARTMENT_OTHER): Payer: Medicare Other | Admitting: Lab

## 2012-01-07 ENCOUNTER — Other Ambulatory Visit (HOSPITAL_COMMUNITY): Payer: Medicare Other

## 2012-01-10 ENCOUNTER — Ambulatory Visit: Payer: Medicare Other | Admitting: Oncology

## 2012-01-17 ENCOUNTER — Ambulatory Visit (HOSPITAL_COMMUNITY)
Admission: RE | Admit: 2012-01-17 | Discharge: 2012-01-17 | Disposition: A | Discharge status: Home / Self Care | Payer: Medicare Other | Source: Ambulatory Visit | Attending: Oncology | Admitting: Oncology

## 2012-01-17 ENCOUNTER — Other Ambulatory Visit: Payer: Medicare Other | Admitting: Lab

## 2012-01-17 DIAGNOSIS — R5383 Other fatigue: Secondary | ICD-10-CM

## 2012-01-17 DIAGNOSIS — C090 Malignant neoplasm of tonsillar fossa: Secondary | ICD-10-CM

## 2012-01-17 DIAGNOSIS — C099 Malignant neoplasm of tonsil, unspecified: Secondary | ICD-10-CM | POA: Insufficient documentation

## 2012-01-17 DIAGNOSIS — E46 Unspecified protein-calorie malnutrition: Secondary | ICD-10-CM

## 2012-01-17 LAB — CMP (CANCER CENTER ONLY)
ALT(SGPT): 24 U/L (ref 10–47)
AST: 21 U/L (ref 11–38)
CO2: 28 mEq/L (ref 18–33)
Calcium: 9.8 mg/dL (ref 8.0–10.3)
Chloride: 98 mEq/L (ref 98–108)
Potassium: 4.2 mEq/L (ref 3.3–4.7)
Sodium: 139 mEq/L (ref 128–145)
Total Protein: 7.3 g/dL (ref 6.4–8.1)

## 2012-01-17 LAB — TSH: TSH: 2.66 u[IU]/mL (ref 0.350–4.500)

## 2012-01-17 LAB — CBC WITH DIFFERENTIAL/PLATELET
BASO%: 0.4 % (ref 0.0–2.0)
Eosinophils Absolute: 0.2 10*3/uL (ref 0.0–0.5)
MONO#: 0.5 10*3/uL (ref 0.1–0.9)
NEUT#: 4.8 10*3/uL (ref 1.5–6.5)
Platelets: 170 10*3/uL (ref 140–400)
RBC: 4.98 10*6/uL (ref 4.20–5.82)
RDW: 14 % (ref 11.0–14.6)
WBC: 6.2 10*3/uL (ref 4.0–10.3)
lymph#: 0.7 10*3/uL — ABNORMAL LOW (ref 0.9–3.3)

## 2012-01-17 MED ORDER — IOHEXOL 300 MG/ML  SOLN
100.0000 mL | Freq: Once | INTRAMUSCULAR | Status: AC | PRN
  Administered 2012-01-17: 100 mL via INTRAVENOUS

## 2012-01-20 ENCOUNTER — Ambulatory Visit (HOSPITAL_BASED_OUTPATIENT_CLINIC_OR_DEPARTMENT_OTHER): Payer: Medicare Other | Admitting: Oncology

## 2012-01-20 ENCOUNTER — Encounter: Payer: Self-pay | Admitting: Oncology

## 2012-01-20 ENCOUNTER — Telehealth: Payer: Self-pay | Admitting: Oncology

## 2012-01-20 VITALS — BP 125/78 | HR 58 | Temp 97.7°F | Resp 18 | Ht 73.0 in | Wt 227.9 lb

## 2012-01-20 DIAGNOSIS — C099 Malignant neoplasm of tonsil, unspecified: Secondary | ICD-10-CM

## 2012-01-20 DIAGNOSIS — M545 Low back pain, unspecified: Secondary | ICD-10-CM

## 2012-01-20 DIAGNOSIS — B977 Papillomavirus as the cause of diseases classified elsewhere: Secondary | ICD-10-CM

## 2012-01-20 DIAGNOSIS — C090 Malignant neoplasm of tonsillar fossa: Secondary | ICD-10-CM

## 2012-01-20 DIAGNOSIS — E039 Hypothyroidism, unspecified: Secondary | ICD-10-CM

## 2012-01-20 NOTE — Telephone Encounter (Signed)
appts made and printed for pt aom °

## 2012-01-20 NOTE — Telephone Encounter (Signed)
appts made and printed for pt pt aware that tx will be added    aom

## 2012-01-20 NOTE — Patient Instructions (Addendum)
1.  History of tonsil cancer:  No evidence of local recurrence on CT scan 01/17/2012.  Findings: Diffuse soft tissue thickening of the pharynx has mildly  regressed over the past year. Other post-therapy changes also have  decreased, including widespread soft tissue stranding.  Negative visualized superior mediastinum. Negative thyroid.  Negative larynx.  No pharyngeal mass or hyperenhancement. Parapharyngeal,  retropharyngeal, and sublingual spaces are within normal limits.  Parotid and submandibular gland atrophy.  No cervical lymphadenopathy.  Major vascular structures are patent. Negative visualized orbit  soft tissues, brain parenchyma, and visualized paranasal sinuses.  Stable cervical spine degenerative changes. No acute osseous  abnormality identified. Lung apices are stable.   IMPRESSION:   Expected evolution of post-therapy changes. Satisfactory post-  therapy appearance of the neck with no pharyngeal mass or  lymphadenopathy.  2.  Follow up:  In about 8 months.  After 2 years of surveillance CT, we do not do any more CT unless patients develop concerning symptoms for the ENT doctor sees something abnormal on his exam.  3.  Please also follow up with ENT and Rad Onc.
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
## 2012-03-28 ENCOUNTER — Ambulatory Visit: Payer: Self-pay | Admitting: Pain Medicine

## 2012-03-31 ENCOUNTER — Encounter: Payer: Self-pay | Admitting: Radiation Oncology

## 2012-04-06 ENCOUNTER — Ambulatory Visit
Admission: RE | Admit: 2012-04-06 | Discharge: 2012-04-06 | Disposition: A | Discharge status: Home / Self Care | Payer: Medicare Other | Source: Ambulatory Visit | Attending: Radiation Oncology | Admitting: Radiation Oncology

## 2012-04-06 ENCOUNTER — Encounter: Payer: Self-pay | Admitting: Radiation Oncology

## 2012-04-06 VITALS — BP 142/78 | HR 52 | Temp 98.4°F | Resp 20 | Wt 228.4 lb

## 2012-04-06 DIAGNOSIS — C099 Malignant neoplasm of tonsil, unspecified: Secondary | ICD-10-CM

## 2012-04-06 NOTE — Progress Notes (Signed)
Patient here f/u rad tx to left tonsil, txs: 9/119/11-04/16/10 , alert,oriented x3, fair appetite, some taste  After a few bites, taste goes bad.,  Pain lower back still, slight dry mouth,  10:42 AM

## 2012-04-07 ENCOUNTER — Ambulatory Visit: Payer: Medicare Other | Admitting: Radiation Oncology

## 2012-04-07 NOTE — Progress Notes (Signed)
  Radiation Oncology         [phone] ________________________________  Name: Douglas Waters MRN: [id number]  Date: 04/06/2012  DOB: [birth year]  Follow-Up Visit Note  CC: [name], MD  [name], MD  Diagnosis:   Squamous cell carcinoma of the left tonsil, T1, N1, M0  Interval Since Last Radiation:  2 years   Narrative:  The patient returns today for routine follow-up.  The patient indicates that he is doing fairly well. He denies any substantial difficulty with dysphagia or odynophagia. He notes that his taste still is not great and this is one significant complaint. He does complain of some xerostomia although this appears to have improved over time. He also did have to have his esophagus stretched which was lower down in the chest out of the treatment field. The patient has been continuing frequent followups with Dr. Ezzard Standing.                              ALLERGIES:  is allergic to shrimp.  Meds: Current Outpatient Prescriptions  Medication Sig Dispense Refill  . fentaNYL (DURAGESIC - DOSED MCG/HR) 25 MCG/HR Place 1 patch onto the skin every 3 (three) days.        Marland Kitchen HYDROcodone-acetaminophen (VICODIN ES) 7.5-750 MG per tablet Take 1 tablet by mouth every 6 (six) hours as needed.        . polyethylene glycol (MIRALAX / GLYCOLAX) packet Take 17 g by mouth daily.          Physical Findings: The patient is in no acute distress. Patient is alert and oriented.  weight is 228 lb 6.4 oz (103.602 kg). His oral temperature is 98.4 F (36.9 C). His blood pressure is 142/78 and his pulse is 52. His respiration is 20. Marland Kitchen   General: Well-developed, in no acute distress HEENT: Normocephalic, atraumatic; neck looks good with some minimal fibrosis/hyperpigmentation. The oral cavity/oropharynx is clear on direct examination and on mirror exam. Tonsil/base of tongue region without lesions on palpation. Cardiovascular: Regular rate and rhythm Respiratory: Clear to auscultation  bilaterally GI: Soft, nontender, normal bowel sounds Extremities: No edema present   Lab Findings: Lab Results  Component Value Date   WBC 6.2 01/17/2012   HGB 13.8 01/17/2012   HCT 40.2 01/17/2012   MCV 80.7 01/17/2012   PLT 170 01/17/2012     Radiographic Findings: No results found.  Impression:    The patient is a 61 year old male status post chemoradiotherapy for tonsillar cancer. He is 2 years out from treatment and has done fairly well I believe overall. No sign of recurrence.  Plan:  The patient will return for followup in one year in our office and we'll maintain his other followup appointments as well, with 10 being seen at least every 3-4 months.  I spent 15 minutes with the patient today, the majority of which was spent counseling the patient on the diagnosis of cancer and coordinating care.   Radene Gunning, M.D., Ph.D.

## 2012-04-24 ENCOUNTER — Ambulatory Visit (INDEPENDENT_AMBULATORY_CARE_PROVIDER_SITE_OTHER): Payer: Medicare Other | Admitting: Internal Medicine

## 2012-04-24 ENCOUNTER — Encounter: Payer: Self-pay | Admitting: Internal Medicine

## 2012-04-24 VITALS — BP 108/76 | HR 68 | Temp 97.9°F | Ht 70.0 in | Wt 229.0 lb

## 2012-04-24 DIAGNOSIS — G8929 Other chronic pain: Secondary | ICD-10-CM

## 2012-04-24 DIAGNOSIS — M549 Dorsalgia, unspecified: Secondary | ICD-10-CM

## 2012-04-24 DIAGNOSIS — I2510 Atherosclerotic heart disease of native coronary artery without angina pectoris: Secondary | ICD-10-CM

## 2012-04-24 DIAGNOSIS — Z125 Encounter for screening for malignant neoplasm of prostate: Secondary | ICD-10-CM

## 2012-04-24 DIAGNOSIS — Z23 Encounter for immunization: Secondary | ICD-10-CM

## 2012-04-24 DIAGNOSIS — Z0000 Encounter for general adult medical examination without abnormal findings: Secondary | ICD-10-CM

## 2012-04-24 LAB — COMPREHENSIVE METABOLIC PANEL
AST: 14 U/L (ref 0–37)
Alkaline Phosphatase: 81 U/L (ref 39–117)
BUN: 17 mg/dL (ref 6–23)
Creat: 0.86 mg/dL (ref 0.50–1.35)
Glucose, Bld: 98 mg/dL (ref 70–99)
Potassium: 4.3 mEq/L (ref 3.5–5.3)
Total Bilirubin: 0.8 mg/dL (ref 0.3–1.2)

## 2012-04-24 LAB — POCT URINALYSIS DIPSTICK
Blood, UA: NEGATIVE
Ketones, UA: NEGATIVE
Leukocytes, UA: NEGATIVE
Nitrite, UA: NEGATIVE
Protein, UA: NEGATIVE
pH, UA: 5.5

## 2012-04-24 LAB — PSA, MEDICARE: PSA: 0.84 ng/mL (ref <=4.00)

## 2012-04-24 LAB — LIPID PANEL
HDL: 37 mg/dL — ABNORMAL LOW (ref >39)
LDL Cholesterol: 119 mg/dL — ABNORMAL HIGH (ref 0–99)
Total CHOL/HDL Ratio: 4.6 Ratio
Triglycerides: 65 mg/dL (ref <150)
VLDL: 13 mg/dL (ref 0–40)

## 2012-04-24 LAB — CBC WITH DIFFERENTIAL/PLATELET
Basophils Absolute: 0 10*3/uL (ref 0.0–0.1)
Basophils Relative: 0 % (ref 0–1)
Eosinophils Absolute: 0.1 10*3/uL (ref 0.0–0.7)
HCT: 39.6 % (ref 39.0–52.0)
Hemoglobin: 14 g/dL (ref 13.0–17.0)
MCH: 27.2 pg (ref 26.0–34.0)
MCHC: 35.4 g/dL (ref 30.0–36.0)
Monocytes Absolute: 0.4 10*3/uL (ref 0.1–1.0)
Monocytes Relative: 6 % (ref 3–12)
Neutro Abs: 5.9 10*3/uL (ref 1.7–7.7)
RDW: 13.4 % (ref 11.5–15.5)

## 2012-04-24 NOTE — Patient Instructions (Addendum)
Begin Wellbutrin 300 mg XL daily and return in 6 months
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
## 2012-08-09 ENCOUNTER — Ambulatory Visit: Payer: Self-pay | Admitting: Pain Medicine

## 2012-09-20 ENCOUNTER — Other Ambulatory Visit (HOSPITAL_BASED_OUTPATIENT_CLINIC_OR_DEPARTMENT_OTHER): Payer: Medicare Other | Admitting: Lab

## 2012-09-20 ENCOUNTER — Telehealth: Payer: Self-pay | Admitting: Oncology

## 2012-09-20 ENCOUNTER — Encounter: Payer: Self-pay | Admitting: Oncology

## 2012-09-20 ENCOUNTER — Ambulatory Visit (HOSPITAL_BASED_OUTPATIENT_CLINIC_OR_DEPARTMENT_OTHER): Payer: Medicare Other | Admitting: Oncology

## 2012-09-20 VITALS — BP 141/79 | HR 60 | Temp 97.4°F | Resp 18 | Ht 70.0 in | Wt 233.1 lb

## 2012-09-20 DIAGNOSIS — C099 Malignant neoplasm of tonsil, unspecified: Secondary | ICD-10-CM

## 2012-09-20 DIAGNOSIS — R17 Unspecified jaundice: Secondary | ICD-10-CM

## 2012-09-20 DIAGNOSIS — M545 Low back pain, unspecified: Secondary | ICD-10-CM

## 2012-09-20 DIAGNOSIS — E039 Hypothyroidism, unspecified: Secondary | ICD-10-CM

## 2012-09-20 DIAGNOSIS — G8929 Other chronic pain: Secondary | ICD-10-CM

## 2012-09-20 DIAGNOSIS — B977 Papillomavirus as the cause of diseases classified elsewhere: Secondary | ICD-10-CM

## 2012-09-20 LAB — CBC WITH DIFFERENTIAL/PLATELET
BASO%: 0.4 % (ref 0.0–2.0)
Basophils Absolute: 0 10*3/uL (ref 0.0–0.1)
EOS%: 2.5 % (ref 0.0–7.0)
HGB: 14.8 g/dL (ref 13.0–17.1)
MCH: 26.9 pg — ABNORMAL LOW (ref 27.2–33.4)
MCHC: 34 g/dL (ref 32.0–36.0)
MCV: 79.1 fL — ABNORMAL LOW (ref 79.3–98.0)
MONO%: 6.3 % (ref 0.0–14.0)
RBC: 5.5 10*6/uL (ref 4.20–5.82)
RDW: 13.6 % (ref 11.0–14.6)
lymph#: 0.8 10*3/uL — ABNORMAL LOW (ref 0.9–3.3)

## 2012-09-20 LAB — COMPREHENSIVE METABOLIC PANEL (CC13)
AST: 17 U/L (ref 5–34)
Alkaline Phosphatase: 92 U/L (ref 40–150)
Glucose: 103 mg/dl — ABNORMAL HIGH (ref 70–99)
Sodium: 139 mEq/L (ref 136–145)
Total Bilirubin: 1.85 mg/dL — ABNORMAL HIGH (ref 0.20–1.20)
Total Protein: 7.7 g/dL (ref 6.4–8.3)
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
## 2012-09-27 ENCOUNTER — Encounter: Payer: Self-pay | Admitting: Oncology

## 2012-09-27 ENCOUNTER — Ambulatory Visit (HOSPITAL_COMMUNITY)
Admission: RE | Admit: 2012-09-27 | Discharge: 2012-09-27 | Disposition: A | Discharge status: Home / Self Care | Payer: Medicare Other | Source: Ambulatory Visit | Attending: Oncology | Admitting: Oncology

## 2012-09-27 DIAGNOSIS — K8020 Calculus of gallbladder without cholecystitis without obstruction: Secondary | ICD-10-CM | POA: Insufficient documentation

## 2012-09-27 DIAGNOSIS — R17 Unspecified jaundice: Secondary | ICD-10-CM | POA: Insufficient documentation

## 2012-10-17 ENCOUNTER — Encounter: Payer: Self-pay | Admitting: Internal Medicine

## 2012-10-17 ENCOUNTER — Ambulatory Visit (INDEPENDENT_AMBULATORY_CARE_PROVIDER_SITE_OTHER): Payer: Medicare Other | Admitting: Internal Medicine

## 2012-10-17 VITALS — BP 124/76 | HR 64 | Temp 98.6°F | Wt 223.0 lb

## 2012-10-17 DIAGNOSIS — K8020 Calculus of gallbladder without cholecystitis without obstruction: Secondary | ICD-10-CM

## 2012-10-17 DIAGNOSIS — M549 Dorsalgia, unspecified: Secondary | ICD-10-CM

## 2012-10-17 DIAGNOSIS — F329 Major depressive disorder, single episode, unspecified: Secondary | ICD-10-CM

## 2012-10-17 DIAGNOSIS — R112 Nausea with vomiting, unspecified: Secondary | ICD-10-CM

## 2012-10-17 DIAGNOSIS — G8929 Other chronic pain: Secondary | ICD-10-CM

## 2012-10-17 DIAGNOSIS — F32A Depression, unspecified: Secondary | ICD-10-CM

## 2012-10-17 DIAGNOSIS — C099 Malignant neoplasm of tonsil, unspecified: Secondary | ICD-10-CM

## 2012-10-17 DIAGNOSIS — R1011 Right upper quadrant pain: Secondary | ICD-10-CM

## 2012-10-17 LAB — CBC WITH DIFFERENTIAL/PLATELET
Eosinophils Absolute: 0.2 10*3/uL (ref 0.0–0.7)
Eosinophils Relative: 5 % (ref 0–5)
Hemoglobin: 13.5 g/dL (ref 13.0–17.0)
Lymphs Abs: 0.6 10*3/uL — ABNORMAL LOW (ref 0.7–4.0)
MCH: 27.3 pg (ref 26.0–34.0)
MCHC: 34.4 g/dL (ref 30.0–36.0)
MCV: 79.4 fL (ref 78.0–100.0)
Monocytes Relative: 9 % (ref 3–12)
RBC: 4.94 MIL/uL (ref 4.22–5.81)

## 2012-10-17 LAB — HEPATIC FUNCTION PANEL
ALT: 13 U/L (ref 0–53)
AST: 16 U/L (ref 0–37)
Alkaline Phosphatase: 76 U/L (ref 39–117)
Bilirubin, Direct: 0.3 mg/dL (ref 0.0–0.3)
Indirect Bilirubin: 1.4 mg/dL — ABNORMAL HIGH (ref 0.0–0.9)

## 2012-10-24 ENCOUNTER — Ambulatory Visit: Payer: Medicare Other | Admitting: Internal Medicine

## 2012-10-24 ENCOUNTER — Telehealth: Payer: Self-pay

## 2012-10-24 DIAGNOSIS — K8020 Calculus of gallbladder without cholecystitis without obstruction: Secondary | ICD-10-CM

## 2012-10-25 NOTE — Telephone Encounter (Signed)
Scheduled for an appointment with Dr. Abbey Chatters on 11/13/2012

## 2012-10-27 ENCOUNTER — Encounter (HOSPITAL_COMMUNITY)
Admission: RE | Admit: 2012-10-27 | Discharge: 2012-10-27 | Disposition: A | Discharge status: Home / Self Care | Payer: Medicare Other | Source: Ambulatory Visit | Attending: Internal Medicine | Admitting: Internal Medicine

## 2012-10-27 DIAGNOSIS — R112 Nausea with vomiting, unspecified: Secondary | ICD-10-CM | POA: Insufficient documentation

## 2012-10-27 DIAGNOSIS — R1011 Right upper quadrant pain: Secondary | ICD-10-CM | POA: Insufficient documentation

## 2012-10-27 MED ORDER — TECHNETIUM TC 99M MEBROFENIN IV KIT
5.0000 | PACK | Freq: Once | INTRAVENOUS | Status: AC | PRN
  Administered 2012-10-27: 5 via INTRAVENOUS

## 2012-10-27 NOTE — Progress Notes (Signed)
Patient informed. 

## 2012-10-28 NOTE — Progress Notes (Signed)
  Subjective:    Patient ID: Douglas Waters, male    DOB: [birth year], 62 y.o.   MRN: [id number]  HPI 62 year old white male in today for six-month recheck.  He has been having some right upper quadrant abdominal pain. History of gallstones detected on ultrasound in mid April. This was done apparently because of an elevated liver enzyme. Has not had hepatobiliary scan. Has had episodes of nausea that are fairly frequent and seemed to be related to meals. He feels fairly certain he has a bad gallbladder because of the symptoms. He has a long-standing history of depression related to chronic low back pain. History of carcinoma of the tonsil treated with radiation.    Review of Systems     Objective:   Physical Exam Abdomen is soft nondistended. No hepatosplenomegaly or masses. Mild tenderness in the right upper quadrant without rebound. Chest clear to auscultation. Cardiac exam regular rate and rhythm normal S1 and S2. Extremities without edema.       Assessment & Plan:  Gallstones- order hepatobiliary scan for evaluation of gallbladder function.  Chronic low back pain  Depression  History of carcinoma of the tonsil  Plan: Will be due for physical examination in 6 months. Labs drawn today included CBC with differential, liver panel, amylase and lipase

## 2012-11-12 NOTE — Patient Instructions (Addendum)
We will schedule hepatobiliary scan in the near future. Continue same medications. Lab studies are pending.

## 2012-11-13 ENCOUNTER — Encounter (INDEPENDENT_AMBULATORY_CARE_PROVIDER_SITE_OTHER): Payer: Self-pay | Admitting: General Surgery

## 2012-11-13 ENCOUNTER — Ambulatory Visit (INDEPENDENT_AMBULATORY_CARE_PROVIDER_SITE_OTHER): Payer: Medicare Other | Admitting: General Surgery

## 2012-11-13 VITALS — BP 115/78 | HR 55 | Temp 97.9°F | Resp 18 | Ht 72.0 in | Wt 229.8 lb

## 2012-11-13 DIAGNOSIS — K8020 Calculus of gallbladder without cholecystitis without obstruction: Secondary | ICD-10-CM

## 2012-11-13 NOTE — Patient Instructions (Signed)
[facility] ______[facility] °LAPAROSCOPIC SURGERY: POST OP INSTRUCTIONS °Always review your discharge instruction sheet given to you by the facility where your surgery was performed. °IF YOU HAVE DISABILITY OR FAMILY LEAVE FORMS, YOU MUST BRING THEM TO THE OFFICE FOR PROCESSING.   °DO NOT GIVE THEM TO YOUR DOCTOR. ° °1. A prescription for pain medication may be given to you upon discharge.  Take your pain medication as prescribed, if needed.  If narcotic pain medicine is not needed, then you may take acetaminophen (Tylenol) or ibuprofen (Advil) as needed. °2. Take your usually prescribed medications unless otherwise directed. °3. If you need a refill on your pain medication, please contact your pharmacy.  They will contact our office to request authorization. Prescriptions will not be filled after 5pm or on week-ends. °4. You should follow a light diet the first few days after arrival home, such as soup and crackers, etc.  Be sure to include lots of fluids daily. °5. Most patients will experience some swelling and bruising in the area of the incisions.  Ice packs will help.  Swelling and bruising can take several days to resolve.  °6. It is common to experience some constipation if taking pain medication after surgery.  Increasing fluid intake and taking a stool softener (such as Colace) will usually help or prevent this problem from occurring.  A mild laxative (Milk of Magnesia or Miralax) should be taken according to package instructions if there are no bowel movements after 48 hours. °7. Unless discharge instructions indicate otherwise, you may remove your bandages 24-48 hours after surgery, and you may shower at that time.  You may have steri-strips (small skin tapes) in place directly over the incision.  These strips should be left on the skin for 7-10 days.  If your surgeon used skin glue on the incision, you may shower in 24 hours.  The glue will flake off over the next 2-3 weeks.  Any sutures or  staples will be removed at the office during your follow-up visit. °8. ACTIVITIES:  You may resume regular (light) daily activities beginning the next day--such as daily self-care, walking, climbing stairs--gradually increasing activities as tolerated.  You may have sexual intercourse when it is comfortable.  Refrain from any heavy lifting or straining until approved by your doctor. °a. You may drive when you are no longer taking prescription pain medication, you can comfortably wear a seatbelt, and you can safely maneuver your car and apply brakes. °b. RETURN TO WORK:  __________________________________________________________ °9. You should see your doctor in the office for a follow-up appointment approximately 2-3 weeks after your surgery.  Make sure that you call for this appointment within a day or two after you arrive home to insure a convenient appointment time. °10. OTHER INSTRUCTIONS: __________________________________________________________________________________________________________________________ __________________________________________________________________________________________________________________________ °WHEN TO CALL YOUR DOCTOR: °1. Fever over 101.0 °2. Inability to urinate °3. Continued bleeding from incision. °4. Increased pain, redness, or drainage from the incision. °5. Increasing abdominal pain ° °The clinic staff is available to answer your questions during regular business hours.  Please don’t hesitate to call and ask to speak to one of the nurses for clinical concerns.  If you have a medical emergency, go to the nearest emergency room or call 911.  A surgeon from [facility] is always on call at the hospital. °[address], [address], [place]  [address] ? [address] [address], [place], [place]   [address] °[phone] ? [phone] ? FAX [phone] °Web site:   [email or web address] °
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
## 2012-12-06 ENCOUNTER — Ambulatory Visit: Payer: Self-pay | Admitting: Pain Medicine

## 2012-12-08 ENCOUNTER — Encounter (HOSPITAL_COMMUNITY): Payer: Self-pay | Admitting: Pharmacy Technician

## 2012-12-12 NOTE — Patient Instructions (Addendum)
20 Josehua Hammar Buckwalter  12/12/2012   Your procedure is scheduled on: 12/19/12  Report to [facility] at 8:30 AM.  Call this number if you have problems the morning of surgery 336-: [phone]   Remember:   Do not eat food or drink liquids After Midnight.     Take these medicines the morning of surgery with A SIP OF WATER: wellbutrin   Do not wear jewelry, make-up or nail polish.  Do not wear lotions, powders, or perfumes. You may wear deodorant.  Do not shave 48 hours prior to surgery. Men may shave face and neck.  Do not bring valuables to the hospital.  Contacts, dentures or bridgework may not be worn into surgery.     Patients discharged the day of surgery will not be allowed to drive home.  Name and phone number of your driver: Rinaldo Cloud [phone]   Birdie Sons, RN  pre op nurse call if needed [phone]    FAILURE TO FOLLOW THESE INSTRUCTIONS MAY RESULT IN CANCELLATION OF YOUR SURGERY   Patient Signature: ___________________________________________

## 2012-12-13 ENCOUNTER — Encounter (HOSPITAL_COMMUNITY): Payer: Self-pay

## 2012-12-13 ENCOUNTER — Encounter (HOSPITAL_COMMUNITY)
Admission: RE | Admit: 2012-12-13 | Discharge: 2012-12-13 | Disposition: A | Discharge status: Home / Self Care | Payer: Medicare Other | Source: Ambulatory Visit | Attending: General Surgery | Admitting: General Surgery

## 2012-12-13 DIAGNOSIS — Z01812 Encounter for preprocedural laboratory examination: Secondary | ICD-10-CM | POA: Insufficient documentation

## 2012-12-13 DIAGNOSIS — K8020 Calculus of gallbladder without cholecystitis without obstruction: Secondary | ICD-10-CM | POA: Insufficient documentation

## 2012-12-13 LAB — CBC WITH DIFFERENTIAL/PLATELET
Eosinophils Absolute: 0.2 10*3/uL (ref 0.0–0.7)
Eosinophils Relative: 4 % (ref 0–5)
HCT: 40.9 % (ref 39.0–52.0)
Hemoglobin: 13.8 g/dL (ref 13.0–17.0)
Lymphs Abs: 0.8 10*3/uL (ref 0.7–4.0)
MCH: 27.2 pg (ref 26.0–34.0)
MCHC: 33.7 g/dL (ref 30.0–36.0)
MCV: 80.7 fL (ref 78.0–100.0)
Monocytes Absolute: 0.5 10*3/uL (ref 0.1–1.0)
Monocytes Relative: 9 % (ref 3–12)
Neutrophils Relative %: 74 % (ref 43–77)
RBC: 5.07 MIL/uL (ref 4.22–5.81)

## 2012-12-13 LAB — COMPREHENSIVE METABOLIC PANEL
Alkaline Phosphatase: 95 U/L (ref 39–117)
BUN: 17 mg/dL (ref 6–23)
Creatinine, Ser: 0.93 mg/dL (ref 0.50–1.35)
GFR calc Af Amer: >90 mL/min (ref >90)
Glucose, Bld: 107 mg/dL — ABNORMAL HIGH (ref 70–99)
Potassium: 4.2 mEq/L (ref 3.5–5.1)
Total Protein: 7.7 g/dL (ref 6.0–8.3)

## 2012-12-13 LAB — PROTIME-INR
INR: 0.9 (ref 0.00–1.49)
Prothrombin Time: 12 seconds (ref 11.6–15.2)

## 2012-12-19 ENCOUNTER — Encounter (HOSPITAL_COMMUNITY)
Admission: RE | Disposition: A | Discharge status: Home / Self Care | Payer: Self-pay | Source: Ambulatory Visit | Attending: General Surgery

## 2012-12-19 ENCOUNTER — Ambulatory Visit (HOSPITAL_COMMUNITY): Payer: Medicare Other

## 2012-12-19 ENCOUNTER — Encounter (HOSPITAL_COMMUNITY): Payer: Self-pay | Admitting: Anesthesiology

## 2012-12-19 ENCOUNTER — Ambulatory Visit (HOSPITAL_COMMUNITY): Payer: Medicare Other | Admitting: Anesthesiology

## 2012-12-19 ENCOUNTER — Encounter (HOSPITAL_COMMUNITY): Payer: Self-pay | Admitting: *Deleted

## 2012-12-19 ENCOUNTER — Ambulatory Visit (HOSPITAL_COMMUNITY)
Admission: RE | Admit: 2012-12-19 | Discharge: 2012-12-19 | Disposition: A | Discharge status: Home / Self Care | Payer: Medicare Other | Source: Ambulatory Visit | Attending: General Surgery | Admitting: General Surgery

## 2012-12-19 DIAGNOSIS — Z79899 Other long term (current) drug therapy: Secondary | ICD-10-CM | POA: Insufficient documentation

## 2012-12-19 DIAGNOSIS — Z01812 Encounter for preprocedural laboratory examination: Secondary | ICD-10-CM | POA: Insufficient documentation

## 2012-12-19 DIAGNOSIS — Z923 Personal history of irradiation: Secondary | ICD-10-CM | POA: Insufficient documentation

## 2012-12-19 DIAGNOSIS — K8010 Calculus of gallbladder with chronic cholecystitis without obstruction: Secondary | ICD-10-CM

## 2012-12-19 DIAGNOSIS — Z85819 Personal history of malignant neoplasm of unspecified site of lip, oral cavity, and pharynx: Secondary | ICD-10-CM | POA: Insufficient documentation

## 2012-12-19 DIAGNOSIS — Z87891 Personal history of nicotine dependence: Secondary | ICD-10-CM | POA: Insufficient documentation

## 2012-12-19 HISTORY — PX: CHOLECYSTECTOMY: SHX55

## 2012-12-19 SURGERY — LAPAROSCOPIC CHOLECYSTECTOMY WITH INTRAOPERATIVE CHOLANGIOGRAM
Anesthesia: General | Wound class: Contaminated

## 2012-12-19 MED ORDER — 0.9 % SODIUM CHLORIDE (POUR BTL) OPTIME
TOPICAL | Status: DC | PRN
  Administered 2012-12-19: 1000 mL

## 2012-12-19 MED ORDER — FENTANYL CITRATE 0.05 MG/ML IJ SOLN
INTRAMUSCULAR | Status: DC | PRN
  Administered 2012-12-19 (×2): 50 ug via INTRAVENOUS

## 2012-12-19 MED ORDER — KETOROLAC TROMETHAMINE 30 MG/ML IJ SOLN
15.0000 mg | Freq: Once | INTRAMUSCULAR | Status: AC | PRN
  Administered 2012-12-19: 30 mg via INTRAVENOUS

## 2012-12-19 MED ORDER — PROMETHAZINE HCL 25 MG/ML IJ SOLN: 6.2500 mg | INTRAMUSCULAR | Status: DC | PRN

## 2012-12-19 MED ORDER — OXYCODONE HCL 5 MG PO TABS: 5.0000 mg | ORAL_TABLET | ORAL | Status: DC | PRN

## 2012-12-19 MED ORDER — FENTANYL CITRATE 0.05 MG/ML IJ SOLN
25.0000 ug | INTRAMUSCULAR | Status: DC | PRN
  Administered 2012-12-19 (×2): 50 ug via INTRAVENOUS

## 2012-12-19 MED ORDER — FENTANYL CITRATE 0.05 MG/ML IJ SOLN
INTRAMUSCULAR | Status: AC
  Filled 2012-12-19: qty 2

## 2012-12-19 MED ORDER — ACETAMINOPHEN 325 MG PO TABS: 650.0000 mg | ORAL_TABLET | ORAL | Status: DC | PRN

## 2012-12-19 MED ORDER — IOHEXOL 300 MG/ML  SOLN
INTRAMUSCULAR | Status: DC | PRN
  Administered 2012-12-19: 5 mL via INTRAVENOUS

## 2012-12-19 MED ORDER — FENTANYL 25 MCG/HR TD PT72
25.0000 ug | MEDICATED_PATCH | TRANSDERMAL | Status: DC
  Administered 2012-12-19: 25 ug via TRANSDERMAL
  Filled 2012-12-19: qty 1

## 2012-12-19 MED ORDER — BUPIVACAINE HCL (PF) 0.5 % IJ SOLN
INTRAMUSCULAR | Status: AC
  Filled 2012-12-19: qty 30

## 2012-12-19 MED ORDER — BUPIVACAINE HCL 0.5 % IJ SOLN
INTRAMUSCULAR | Status: DC | PRN
  Administered 2012-12-19: 17 mL

## 2012-12-19 MED ORDER — DEXTROSE IN LACTATED RINGERS 5 % IV SOLN: INTRAVENOUS | Status: DC

## 2012-12-19 MED ORDER — PROPOFOL 10 MG/ML IV BOLUS
INTRAVENOUS | Status: DC | PRN
  Administered 2012-12-19: 150 mg via INTRAVENOUS
  Administered 2012-12-19: 50 mg via INTRAVENOUS

## 2012-12-19 MED ORDER — CISATRACURIUM BESYLATE (PF) 10 MG/5ML IV SOLN
INTRAVENOUS | Status: DC | PRN
  Administered 2012-12-19: 12 mg via INTRAVENOUS

## 2012-12-19 MED ORDER — ONDANSETRON HCL 4 MG/2ML IJ SOLN: 4.0000 mg | Freq: Four times a day (QID) | INTRAMUSCULAR | Status: DC | PRN

## 2012-12-19 MED ORDER — LIDOCAINE HCL (CARDIAC) 20 MG/ML IV SOLN
INTRAVENOUS | Status: DC | PRN
  Administered 2012-12-19: 100 mg via INTRAVENOUS

## 2012-12-19 MED ORDER — CEFAZOLIN SODIUM-DEXTROSE 2-3 GM-% IV SOLR
2.0000 g | INTRAVENOUS | Status: AC
  Administered 2012-12-19: 2 g via INTRAVENOUS

## 2012-12-19 MED ORDER — ACETAMINOPHEN 650 MG RE SUPP: 650.0000 mg | RECTAL | Status: DC | PRN

## 2012-12-19 MED ORDER — IOHEXOL 300 MG/ML  SOLN
INTRAMUSCULAR | Status: AC
  Filled 2012-12-19: qty 1

## 2012-12-19 MED ORDER — SUFENTANIL CITRATE 50 MCG/ML IV SOLN
INTRAVENOUS | Status: DC | PRN
  Administered 2012-12-19 (×4): 10 ug via INTRAVENOUS
  Administered 2012-12-19: 5 ug via INTRAVENOUS

## 2012-12-19 MED ORDER — KETOROLAC TROMETHAMINE 30 MG/ML IJ SOLN
INTRAMUSCULAR | Status: AC
  Filled 2012-12-19: qty 1

## 2012-12-19 MED ORDER — NEOSTIGMINE METHYLSULFATE 1 MG/ML IJ SOLN
INTRAMUSCULAR | Status: DC | PRN
  Administered 2012-12-19: 5 mg via INTRAVENOUS

## 2012-12-19 MED ORDER — LACTATED RINGERS IR SOLN
Status: DC | PRN
  Administered 2012-12-19: 3000 mL

## 2012-12-19 MED ORDER — SODIUM CHLORIDE 0.9 % IJ SOLN: 3.0000 mL | INTRAMUSCULAR | Status: DC | PRN

## 2012-12-19 MED ORDER — GLYCOPYRROLATE 0.2 MG/ML IJ SOLN
INTRAMUSCULAR | Status: DC | PRN
  Administered 2012-12-19: .8 mg via INTRAVENOUS

## 2012-12-19 MED ORDER — CEFAZOLIN SODIUM-DEXTROSE 2-3 GM-% IV SOLR
INTRAVENOUS | Status: AC
  Filled 2012-12-19: qty 50

## 2012-12-19 MED ORDER — SUCCINYLCHOLINE CHLORIDE 20 MG/ML IJ SOLN
INTRAMUSCULAR | Status: DC | PRN
  Administered 2012-12-19: 100 mg via INTRAVENOUS

## 2012-12-19 MED ORDER — PROMETHAZINE HCL 25 MG/ML IJ SOLN
INTRAMUSCULAR | Status: AC
  Administered 2012-12-19: 12.5 mg
  Filled 2012-12-19: qty 1

## 2012-12-19 MED ORDER — ONDANSETRON HCL 4 MG/2ML IJ SOLN
INTRAMUSCULAR | Status: DC | PRN
  Administered 2012-12-19: 4 mg via INTRAVENOUS

## 2012-12-19 MED ORDER — LACTATED RINGERS IV SOLN
INTRAVENOUS | Status: DC
  Administered 2012-12-19: 1000 mL via INTRAVENOUS
  Administered 2012-12-19: 12:00:00 via INTRAVENOUS

## 2012-12-19 MED ORDER — MORPHINE SULFATE 10 MG/ML IJ SOLN: 2.0000 mg | INTRAMUSCULAR | Status: DC | PRN

## 2012-12-19 MED ORDER — MIDAZOLAM HCL 5 MG/5ML IJ SOLN
INTRAMUSCULAR | Status: DC | PRN
  Administered 2012-12-19: 2 mg via INTRAVENOUS

## 2012-12-19 SURGICAL SUPPLY — 45 items
APPLIER CLIP 5 13 M/L LIGAMAX5 (MISCELLANEOUS) ×2
BENZOIN TINCTURE PRP APPL 2/3 (GAUZE/BANDAGES/DRESSINGS) ×2 IMPLANT
CANISTER SUCTION 2500CC (MISCELLANEOUS) ×2 IMPLANT
CHLORAPREP W/TINT 26ML (MISCELLANEOUS) ×2 IMPLANT
CLIP APPLIE 5 13 M/L LIGAMAX5 (MISCELLANEOUS) ×1 IMPLANT
CLOTH BEACON ORANGE TIMEOUT ST (SAFETY) ×2 IMPLANT
COVER MAYO STAND STRL (DRAPES) IMPLANT
COVER SURGICAL LIGHT HANDLE (MISCELLANEOUS) IMPLANT
DECANTER SPIKE VIAL GLASS SM (MISCELLANEOUS) ×2 IMPLANT
DRAPE C-ARM 42X120 X-RAY (DRAPES) IMPLANT
DRAPE LAPAROSCOPIC ABDOMINAL (DRAPES) ×2 IMPLANT
DRAPE UTILITY XL STRL (DRAPES) ×2 IMPLANT
DRESSING SURGICEL FIBRLLR 1X2 (HEMOSTASIS) IMPLANT
DRSG SURGICEL FIBRILLAR 1X2 (HEMOSTASIS)
DRSG TEGADERM 2-3/8X2-3/4 SM (GAUZE/BANDAGES/DRESSINGS) ×2 IMPLANT
ELECT REM PT RETURN 9FT ADLT (ELECTROSURGICAL) ×2
ELECTRODE REM PT RTRN 9FT ADLT (ELECTROSURGICAL) ×1 IMPLANT
ENDOLOOP SUT PDS II  0 18 (SUTURE)
ENDOLOOP SUT PDS II 0 18 (SUTURE) IMPLANT
GAUZE SPONGE 2X2 8PLY STRL LF (GAUZE/BANDAGES/DRESSINGS) ×1 IMPLANT
GLOVE ECLIPSE 8.0 STRL XLNG CF (GLOVE) ×2 IMPLANT
GLOVE INDICATOR 8.0 STRL GRN (GLOVE) ×4 IMPLANT
GOWN STRL NON-REIN LRG LVL3 (GOWN DISPOSABLE) ×2 IMPLANT
GOWN STRL REIN XL XLG (GOWN DISPOSABLE) ×6 IMPLANT
HEMOSTAT SNOW SURGICEL 2X4 (HEMOSTASIS) ×2 IMPLANT
HEMOSTAT SURGICEL 4X8 (HEMOSTASIS) IMPLANT
IV CATH 14GX2 1/4 (CATHETERS) IMPLANT
KIT BASIN OR (CUSTOM PROCEDURE TRAY) ×2 IMPLANT
NS IRRIG 1000ML POUR BTL (IV SOLUTION) IMPLANT
POUCH SPECIMEN RETRIEVAL 10MM (ENDOMECHANICALS) IMPLANT
SCISSORS LAP 5X35 DISP (ENDOMECHANICALS) ×2 IMPLANT
SET CHOLANGIOGRAPH MIX (MISCELLANEOUS) ×2 IMPLANT
SET IRRIG TUBING LAPAROSCOPIC (IRRIGATION / IRRIGATOR) ×2 IMPLANT
SLEEVE XCEL OPT CAN 5 100 (ENDOMECHANICALS) ×4 IMPLANT
SOLUTION ANTI FOG 6CC (MISCELLANEOUS) ×2 IMPLANT
SPONGE GAUZE 2X2 STER 10/PKG (GAUZE/BANDAGES/DRESSINGS) ×1
STRIP CLOSURE SKIN 1/2X4 (GAUZE/BANDAGES/DRESSINGS) ×2 IMPLANT
SUT MNCRL AB 4-0 PS2 18 (SUTURE) ×2 IMPLANT
SUT VICRYL 0 UR6 27IN ABS (SUTURE) ×2 IMPLANT
TOWEL OR 17X26 10 PK STRL BLUE (TOWEL DISPOSABLE) ×2 IMPLANT
TRAY LAP CHOLE (CUSTOM PROCEDURE TRAY) ×2 IMPLANT
TROCAR BLADELESS OPT 5 100 (ENDOMECHANICALS) ×2 IMPLANT
TROCAR XCEL BLUNT TIP 100MML (ENDOMECHANICALS) ×2 IMPLANT
TROCAR XCEL NON-BLD 11X100MML (ENDOMECHANICALS) ×2 IMPLANT
TUBING INSUFFLATION 10FT LAP (TUBING) ×2 IMPLANT

## 2012-12-19 NOTE — Op Note (Signed)
Operative Note  Douglas Waters male 62 y.o. 12/19/2012  PREOPERATIVE ZO:[id number] cholelithiasis  POSTOPERATIVE DX:  Same  PROCEDURE:Laparoscopic cholecystectomy and cholangiogram         Surgeon: Adolph Pollack   Assistants: Estelle Grumbles, M.D.  Anesthesia: General endotracheal anesthesia  Indications: This is a 62 year old male who's had multiple abdominal surgeries. He has symptomatic cholelithiasis and now presents for laparoscopic possible open cholecystectomy. The procedure, risks, and after care have been discussed with him preoperatively.    Procedure Detail:  He was seen in the holding area. He was then brought to the operating room placed supine on the operating table and a general anesthetic was given. The hair and the abdominal wall was clipped. The abdominal wall was widely sterilely prepped and draped.  Marcaine solution was infiltrated in the right mid lateral abdomen. A small incision was made in the right mid lateral abdomen through the skin and subcutaneous tissue. The fascial layers were divided and the muscle was retracted. I then made a small incision in the peritoneum and entered the peritoneal cavity. A pursestring suture of 0 Vicryls placed around the edges of the fascia. A Hassan trocar was introduced and a pneumoperitoneum was created.  The laparoscope was introduced. There is no underlying organ injury or bleeding. Significant adhesions between the omentum and midline abdominal wall were noted. However the right upper quadrant was relatively free of adhesions. A 5 mm trocar was then placed into the abdominal cavity through the epigastric incision. An 11 mm trocar was then placed through an incision just to the right of the umbilicus. A final 5 mm trocar was placed in the right upper quadrant.  The fundus of the gallbladder was identified. It was grasped and retracted toward the right shoulder. Adhesions between the omentum and gallbladder were separated  bluntly. Adhesions between the duodenum and infundibulum the gallbladder were separated with sharp dissection. The infundibulum was then retracted laterally and mobilized. Using blunt dissection I identified the cystic duct. A window was created around it. The critical view was achieved. A clip was placed at the cystic duct gallbladder junction. A small incision was made in the cystic duct. A cholangiocatheter was passed to the anterior abdominal wall and placed into the cystic duct. A cholangiogram was then performed.  Under fluoroscopy dilute contrast was injected into the cystic duct. The common hepatic, right and left hepatic, and common bile ducts all opacified promptly. Contrast drained into the duodenum without obvious evidence of obstruction. Final report is pending the radiologist's interpretation.  The cholangiocatheter was removed, the cystic duct was then clipped 3 times on the biliary side and divided. Using blunt dissection an anterior and posterior branch of the cystic artery were identified close to the gallbladder. These were clipped and divided. During retraction a small hole was made in the gallbladder and some bile spilled out but no stones spilled out.  Using electrocautery the gallbladder is dissected free from the liver and put in a retrieval bag. The gallbladder fossa and perihepatic area were then copiously irrigated with solution. Bleeding was controlled with electrocautery. A piece of Surgicel was then placed in the gallbladder fossa.  Further inspection of the area demonstrated no evidence of bleeding or bile leak.  The [facility] trocar in the right midabdomen was removed. The fascial defect was partially closed by tightening up and tied and the purse string suture. The fascial defect was then completely closed using interrupted 0 Vicryl sutures. This was done under laparoscopic vision. The remaining  trocars were removed and the pneumoperitoneum was released.  The skin incisions  were then closed with 4-0 Monocryl subcuticular stitches. Steri-Strips and sterile dressings were applied.  He tolerated the procedure well without any apparent complications and was taken to the recovery in satisfactory condition.  Estimated Blood Loss:  less than 100 mL         Drains: none  Blood Given: none          Specimens: gallbladder        Complications:  * No complications entered in OR log *         Disposition: PACU - hemodynamically stable.         Condition: stable

## 2012-12-19 NOTE — Progress Notes (Signed)
Spoke with Dr. Okey Dupre re; vitals and pain more in control and OK to transfer pt to short stay

## 2012-12-19 NOTE — Anesthesia Postprocedure Evaluation (Signed)
  Anesthesia Post-op Note  Patient: Douglas Waters  Procedure(s) Performed: Procedure(s) (LRB): LAPAROSCOPIC CHOLECYSTECTOMY WITH INTRAOPERATIVE CHOLANGIOGRAM (N/A)  Patient Location: PACU  Anesthesia Type: General  Level of Consciousness: awake and alert   Airway and Oxygen Therapy: Patient Spontanous Breathing  Post-op Pain: mild  Post-op Assessment: Post-op Vital signs reviewed, Patient's Cardiovascular Status Stable, Respiratory Function Stable, Patent Airway and No signs of Nausea or vomiting  Last Vitals:  Filed Vitals:   12/19/12 1245  BP: 156/87  Pulse: 51  Temp:   Resp: 13    Post-op Vital Signs: stable   Complications: No apparent anesthesia complications

## 2012-12-19 NOTE — Transfer of Care (Signed)
Immediate Anesthesia Transfer of Care Note  Patient: Douglas Waters  Procedure(s) Performed: Procedure(s): LAPAROSCOPIC CHOLECYSTECTOMY WITH INTRAOPERATIVE CHOLANGIOGRAM (N/A)  Patient Location: PACU  Anesthesia Type:General  Level of Consciousness: awake  Airway & Oxygen Therapy: Patient Spontanous Breathing and Patient connected to face mask oxygen  Post-op Assessment: Report given to PACU RN, Post -op Vital signs reviewed and stable   Post vital signs: Reviewed and stable  Complications: No apparent anesthesia complications
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
## 2012-12-19 NOTE — Progress Notes (Signed)
Spoke with Dr. Okey Dupre re; pain control and elevated blood pressure; pt restless, clutching abdomen, nauseated,  very tense; orders rec'd and meds given

## 2012-12-19 NOTE — Preoperative (Signed)
Beta Blockers   Reason not to administer Beta Blockers:Not Applicable 

## 2012-12-19 NOTE — Progress Notes (Signed)
Patient's wife concerned about oversedation with fentanyl patch and oxycodone IR. Spoke with Dr. Abbey Chatters and he stated to tell patient to continue hydrocodone and discard oxycodone IR prescription. Prescription was torn up and placed in shredder.

## 2012-12-19 NOTE — Anesthesia Preprocedure Evaluation (Signed)
Anesthesia Evaluation  Patient identified by MRN, date of birth, ID band Patient awake  General Assessment Comment:Primary squamous cell carcinoma of tonsil   History of radiation therapy 03/02/10-04/16/10 left tonsil, has dry mouth can not taste anymore    Reviewed: Allergy & Precautions, H&P , NPO status , Patient's Chart, lab work & pertinent test results  Airway Mallampati: II TM Distance: >3 FB Neck ROM: Full    Dental no notable dental hx.    Pulmonary neg pulmonary ROS,  breath sounds clear to auscultation  Pulmonary exam normal       Cardiovascular negative cardio ROS  + dysrhythmias Rhythm:Regular Rate:Normal     Neuro/Psych negative neurological ROS  negative psych ROS   GI/Hepatic negative GI ROS, Neg liver ROS,   Endo/Other  negative endocrine ROS  Renal/GU negative Renal ROS  negative genitourinary   Musculoskeletal negative musculoskeletal ROS (+)   Abdominal   Peds negative pediatric ROS (+)  Hematology negative hematology ROS (+)   Anesthesia Other Findings   Reproductive/Obstetrics negative OB ROS                           Anesthesia Physical Anesthesia Plan  ASA: II  Anesthesia Plan: General   Post-op Pain Management:    Induction: Intravenous  Airway Management Planned: Oral ETT  Additional Equipment:   Intra-op Plan:   Post-operative Plan: Extubation in OR  Informed Consent: I have reviewed the patients History and Physical, chart, labs and discussed the procedure including the risks, benefits and alternatives for the proposed anesthesia with the patient or authorized representative who has indicated his/her understanding and acceptance.   Dental advisory given  Plan Discussed with: CRNA and Surgeon  Anesthesia Plan Comments:         Anesthesia Quick Evaluation

## 2012-12-20 ENCOUNTER — Telehealth (INDEPENDENT_AMBULATORY_CARE_PROVIDER_SITE_OTHER): Payer: Self-pay

## 2012-12-20 ENCOUNTER — Encounter (HOSPITAL_COMMUNITY): Payer: Self-pay | Admitting: General Surgery

## 2012-12-20 NOTE — Telephone Encounter (Signed)
Per Milas Hock CMA Dr. Abbey Chatters  has advised for patient to return to the pain clinic for further pain relief if the  fentanyl 25 mcg and the hydrocodone 7.5mg  was not relieving his pain. Patient verbalized understanding

## 2012-12-20 NOTE — Telephone Encounter (Signed)
Agree. He may need something stronger for the acute postoperative pain but this should likely be prescribed by his pain clinic physician so he does not break his contract.

## 2012-12-20 NOTE — Telephone Encounter (Signed)
Wife states she called the on call MD last night due to the fentanyl 25 mcg was not relieving his pain.  Dr. Donell Beers advised for patient to take hydrocodone 7.5 mg Q4hrs/prn pain. Patient is not getting any relief with all the above please advise

## 2012-12-20 NOTE — Telephone Encounter (Signed)
His wife called me last night saying that he was having abdominal cramps and his pain meds were not controlling the pain.  When she called, he had not had any pain meds for about 7 hours and so I recommended that he take the hydrocodone every 4 hours and call me back or call the office in the morning if this was still not controlling his pain.

## 2013-01-01 ENCOUNTER — Ambulatory Visit (INDEPENDENT_AMBULATORY_CARE_PROVIDER_SITE_OTHER): Payer: Medicare Other | Admitting: General Surgery

## 2013-01-01 ENCOUNTER — Ambulatory Visit: Payer: Self-pay | Admitting: Pain Medicine

## 2013-01-01 ENCOUNTER — Encounter (INDEPENDENT_AMBULATORY_CARE_PROVIDER_SITE_OTHER): Payer: Self-pay | Admitting: General Surgery

## 2013-01-01 VITALS — BP 120/64 | HR 60 | Resp 14 | Ht 72.0 in | Wt 224.2 lb

## 2013-01-01 DIAGNOSIS — Z9889 Other specified postprocedural states: Secondary | ICD-10-CM

## 2013-01-01 NOTE — Patient Instructions (Signed)
Low-fat diet as much as possible.  When the soreness is gone, can resume all normal activities.

## 2013-01-01 NOTE — Progress Notes (Signed)
Procedure: Laparoscopic cholecystectomy with cholangiogram Date:12/19/2012  Pathology:Chronic cholecystitis and cholelithiasis   History:He is here for his first postoperative visit and is getting better every day. He did have some low-grade fever but that's resolved. He's had some intermittent sweats but these are improving as well. He is eating well. No diarrhea.  Exam: General- Is in NAD. Abdomen-soft, incisions are clean, intact, and solitary  Assessment:  Making satisfactory progress post laparoscopic cholecystectomy.  Plan: Low-fat direct recommended. May resume normal activities when soreness is gone. Return visit as needed.

## 2013-03-28 ENCOUNTER — Ambulatory Visit (INDEPENDENT_AMBULATORY_CARE_PROVIDER_SITE_OTHER): Payer: Medicare Other | Admitting: Internal Medicine

## 2013-03-28 DIAGNOSIS — Z23 Encounter for immunization: Secondary | ICD-10-CM

## 2013-04-05 ENCOUNTER — Encounter: Payer: Self-pay | Admitting: Radiation Oncology

## 2013-04-05 ENCOUNTER — Ambulatory Visit
Admission: RE | Admit: 2013-04-05 | Discharge: 2013-04-05 | Disposition: A | Discharge status: Home / Self Care | Payer: Medicare Other | Source: Ambulatory Visit | Attending: Radiation Oncology | Admitting: Radiation Oncology

## 2013-04-05 VITALS — BP 150/72 | HR 50 | Temp 98.4°F | Resp 20 | Wt 232.9 lb

## 2013-04-05 DIAGNOSIS — C099 Malignant neoplasm of tonsil, unspecified: Secondary | ICD-10-CM

## 2013-04-05 NOTE — Progress Notes (Signed)
Follow up rad  Left tonsil,  Eating anythin he wants, but still no taste, 75% appetite, still has dry mouth and thick saliva only uses water rinses, sees Dr.newman next month, still has chronic low back pain  10:41 AM

## 2013-04-05 NOTE — Progress Notes (Signed)
  Radiation Oncology         [phone] ________________________________  Name: Douglas Waters MRN: [id number]  Date: 04/05/2013  DOB: 11-May-1951  Follow-Up Visit Note  CC: [name], MD  Dillard Cannon E, *  Diagnosis:   Squamous cell carcinoma of the left tonsil, T1, N1, M0  Interval Since Last Radiation:  2 years   Narrative:  The patient returns today for routine follow-up.  The patient states that he is doing fairly well. He really didn't see anything that he wants. He has not had any major difficulties with this since he went through esophageal dilatation. Still a change in taste which is associated with decreased appetite. He still does have some degree of dry mouth with some thick saliva.  ALLERGIES:  is allergic to shrimp.  Meds: Current Outpatient Prescriptions  Medication Sig Dispense Refill  . buPROPion (WELLBUTRIN XL) 300 MG 24 hr tablet Take 300 mg by mouth every morning.       . fentaNYL (DURAGESIC - DOSED MCG/HR) 25 MCG/HR Place 1 patch onto the skin every 3 (three) days.        Marland Kitchen HYDROcodone-acetaminophen (NORCO) 7.5-325 MG per tablet Take 1 tablet by mouth daily.       . polyethylene glycol (MIRALAX / GLYCOLAX) packet Take 17 g by mouth daily.         No current facility-administered medications for this encounter.    Physical Findings: The patient is in no acute distress. Patient is alert and oriented.  weight is 232 lb 14.4 oz (105.643 kg). His oral temperature is 98.4 F (36.9 C). His blood pressure is 150/72 and his pulse is 50. His respiration is 20. Marland Kitchen   General: Well-developed, in no acute distress HEENT: Normocephalic, atraumatic; neck shows some residual hyperpigmentation/fibrosis especially in the neck region. No palpable nodularity with some slight fullness in the level II left neck. The oral cavity/oropharynx is clear on direct examination and on mirror exam. Tonsil/base of tongue region without lesions on palpation. Cardiovascular: Regular  rate and rhythm Respiratory: Clear to auscultation bilaterally GI: Soft, nontender, normal bowel sounds Extremities: No edema present   Lab Findings: Lab Results  Component Value Date   WBC 6.1 12/13/2012   HGB 13.8 12/13/2012   HCT 40.9 12/13/2012   MCV 80.7 12/13/2012   PLT 186 12/13/2012     Radiographic Findings: No results found.  Impression:    The patient is a 62 year old male status post chemoradiotherapy for tonsillar cancer. He is 3 years out from treatment and has done fairly well I believe overall. No sign of recurrence.  Plan:  The patient will return for followup in one year in our office .  I spent 15 minutes with the patient today, the majority of which was spent counseling the patient on the diagnosis of cancer and coordinating care.   Radene Gunning, M.D., Ph.D.

## 2013-06-11 ENCOUNTER — Other Ambulatory Visit: Payer: Self-pay

## 2013-06-11 ENCOUNTER — Encounter: Payer: Self-pay | Admitting: Internal Medicine

## 2013-06-11 ENCOUNTER — Other Ambulatory Visit (INDEPENDENT_AMBULATORY_CARE_PROVIDER_SITE_OTHER): Payer: Medicare Other | Admitting: Internal Medicine

## 2013-06-11 ENCOUNTER — Ambulatory Visit (INDEPENDENT_AMBULATORY_CARE_PROVIDER_SITE_OTHER): Payer: Medicare Other | Admitting: Internal Medicine

## 2013-06-11 VITALS — BP 122/74 | HR 56 | Temp 97.0°F | Ht 70.5 in | Wt 236.0 lb

## 2013-06-11 VITALS — BP 122/72 | HR 56 | Temp 97.0°F | Wt 236.0 lb

## 2013-06-11 DIAGNOSIS — Z0000 Encounter for general adult medical examination without abnormal findings: Secondary | ICD-10-CM

## 2013-06-11 DIAGNOSIS — Z131 Encounter for screening for diabetes mellitus: Secondary | ICD-10-CM

## 2013-06-11 DIAGNOSIS — R635 Abnormal weight gain: Secondary | ICD-10-CM

## 2013-06-11 DIAGNOSIS — Z130 Encounter for screening for diseases of the blood and blood-forming organs and certain disorders involving the immune mechanism: Secondary | ICD-10-CM

## 2013-06-11 DIAGNOSIS — C099 Malignant neoplasm of tonsil, unspecified: Secondary | ICD-10-CM

## 2013-06-11 DIAGNOSIS — G8929 Other chronic pain: Secondary | ICD-10-CM

## 2013-06-11 DIAGNOSIS — M549 Dorsalgia, unspecified: Secondary | ICD-10-CM

## 2013-06-11 DIAGNOSIS — Z8679 Personal history of other diseases of the circulatory system: Secondary | ICD-10-CM

## 2013-06-11 DIAGNOSIS — Z13220 Encounter for screening for lipoid disorders: Secondary | ICD-10-CM

## 2013-06-11 DIAGNOSIS — Z125 Encounter for screening for malignant neoplasm of prostate: Secondary | ICD-10-CM

## 2013-06-11 DIAGNOSIS — Z9889 Other specified postprocedural states: Secondary | ICD-10-CM

## 2013-06-11 LAB — CBC WITH DIFFERENTIAL/PLATELET
Basophils Absolute: 0 10*3/uL (ref 0.0–0.1)
Basophils Relative: 1 % (ref 0–1)
Eosinophils Absolute: 0.3 10*3/uL (ref 0.0–0.7)
Eosinophils Relative: 5 % (ref 0–5)
HCT: 41 % (ref 39.0–52.0)
MCH: 27.3 pg (ref 26.0–34.0)
MCHC: 34.6 g/dL (ref 30.0–36.0)
MCV: 78.8 fL (ref 78.0–100.0)
Monocytes Absolute: 0.4 10*3/uL (ref 0.1–1.0)
Monocytes Relative: 8 % (ref 3–12)
Neutro Abs: 3.7 10*3/uL (ref 1.7–7.7)
RDW: 13.8 % (ref 11.5–15.5)

## 2013-06-11 LAB — POCT URINALYSIS DIPSTICK
Blood, UA: NEGATIVE
Ketones, UA: NEGATIVE
Leukocytes, UA: NEGATIVE
Protein, UA: NEGATIVE
pH, UA: 6

## 2013-06-11 LAB — COMPREHENSIVE METABOLIC PANEL
AST: 17 U/L (ref 0–37)
Alkaline Phosphatase: 83 U/L (ref 39–117)
BUN: 16 mg/dL (ref 6–23)
Creat: 0.85 mg/dL (ref 0.50–1.35)
Glucose, Bld: 93 mg/dL (ref 70–99)
Total Bilirubin: 1.1 mg/dL (ref 0.3–1.2)

## 2013-06-11 LAB — LIPID PANEL
HDL: 34 mg/dL — ABNORMAL LOW (ref >39)
LDL Cholesterol: 108 mg/dL — ABNORMAL HIGH (ref 0–99)
Total CHOL/HDL Ratio: 4.7 Ratio
Triglycerides: 87 mg/dL (ref <150)

## 2013-06-11 LAB — PSA, MEDICARE: PSA: 0.57 ng/mL (ref <=4.00)

## 2013-06-11 MED ORDER — BUPROPION HCL ER (XL) 300 MG PO TB24: 300.0000 mg | ORAL_TABLET | Freq: Every morning | ORAL | Status: DC

## 2013-06-11 NOTE — Progress Notes (Signed)
Subjective:    Patient ID: Douglas Waters, male    DOB: 09-30-1950, 62 y.o.   MRN: [id number]  HPI 62 year old White male with history of chronic back pain, coronary artery disease, history of supraventricular tachycardia status post radiofrequency ablation, status post lumbar hemilaminectomy L5-S1 in today for health maintenance exam and evaluation of medical issues. He is on chronic fentanyl patch per pain clinic in [facility] for back pain. He takes Wellbutrin for depression and pain control. Occasionally takes hydrocodone as well. He has a history of primary squamous cell carcinoma of the tonsil. He's done well with treatment. He had radiation therapy. Patient has put on some weight recently. Previous weight was 229 pounds November 2013 and now weighs 236 pounds. Says he's been drinking a lot of eggnog.  Patient has had influenza immunization. Says he's had a reaction to tetanus toxoid in the past and will not take that immunization. Last tetanus immunization was 1996.  Did chew tobacco but did not smoke. Does not consume alcohol.  Social history: He is married.  Completed high school. 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  Family history: Mother with history of MI and hypertension. Father died with history of Alzheimer's disease and prostate cancer. 2 sisters in good health.  Regarding his chronic back pain history: He had a lumbar hemilaminectomy L5-S1 in July 1992 by Dr. Roxan Hockey. In June 1995 he had an MRI of LS-spine showing some postoperative changes at L5-S1 with a mild bulge, discogenic edema on the right at L5 and degenerative disc disease with a bulge at L4-5 but no fracture or herniated disc. In September 1995, he had an MRI of thoracic spine which was negative. This in November 1995, he went to [facility] of pain management and [place] where he had epidural steroid injections. He also had facet joint injections in January  1996. He also saw Dr. Tresa Endo for chronic back pain in April 2000 and was diagnosed with a degenerative disc. He saw Dr. Modesto Charon at [facility] neurosurgical clinic in 1998 and was given a similar diagnosis. [facility] went [facility] division of neurosurgery in 2001 where he was tried on Neurontin and trazodone. These did not help his back pain. A spinal cord stimulator was discussed but patient declined. He applied for disability because of chronic back issues.   In 2011, he was diagnosed with squamous cell carcinoma of the left tonsil treated with radiation and cisplatin.  History of cardiac catheterization 2004 showed a 25% proximal LAD stenosis. In September 2010 he had a radiofrequency catheter ablation of the easily inducible AV  NRT by Dr. Ladona Ridgel.  Colonoscopy 2003 by Dr. Matthias Hughs was normal. He also repeat colonoscopy 07/21/2012.    Review of Systems  Constitutional: Positive for fatigue.  HENT: Negative.   Eyes: Negative.   Respiratory: Negative.   Cardiovascular: Negative.   Gastrointestinal: Negative.   Endocrine: Negative.   Genitourinary: Negative.   Musculoskeletal: Positive for back pain.  Skin: Negative.   Allergic/Immunologic: Negative.   Neurological: Negative.   Hematological: Negative.   Psychiatric/Behavioral: Negative.        Objective:   Physical Exam  Constitutional: He appears well-developed and well-nourished. No distress.  Cardiovascular: Normal rate, regular rhythm, normal heart sounds and intact distal pulses.   No murmur heard. Pulmonary/Chest: Effort normal and breath sounds normal.  Genitourinary: Prostate normal.  Musculoskeletal: He exhibits no edema.  Skin: He is not diaphoretic.  Assessment & Plan:  Chronic back pain  Coronary artery disease  Depression  Paroxysmal supraventricular tachycardia  History of squamous cell carcinoma of the tonsil  Plan: Continue current medications and return in one year. Colonoscopy  done 07/21/2012. Add hemoglobin A1c to lab work.   Subjective:   Patient presents for Medicare Annual/Subsequent preventive examination.  Review Past Medical/Family/Social: See above  Risk Factors  Current exercise habits: Sedentary Dietary issues discussed: Low-fat low-carb  Cardiac risk factors: History of coronary disease, family history in mother  Depression Screen  (Note: if answer to either of the following is "Yes", a more complete depression screening is indicated)   Over the past two weeks, have you felt down, depressed or hopeless? Sometimes Over the past two weeks, have you felt little interest or pleasure in doing things? Sometimes Have you lost interest or pleasure in daily life? Sometimes Do you often feel hopeless? No Do you cry easily over simple problems? No   Activities of Daily Living  In your present state of health, do you have any difficulty performing the following activities?:   Driving? No  Managing money? No  Feeding yourself? No  Getting from bed to chair? No  Climbing a flight of stairs? No  Preparing food and eating?: No  Bathing or showering? No  Getting dressed: No  Getting to the toilet? No  Using the toilet:No  Moving around from place to place: Yes due to back pain In the past year have you fallen or had a near fall?:No  Are you sexually active? No  Do you have more than one partner? No   Hearing Difficulties: No  Do you often ask people to speak up or repeat themselves? No  Do you experience ringing or noises in your ears? No  Do you have difficulty understanding soft or whispered voices? No  Do you feel that you have a problem with memory? No Do you often misplace items? No    Home Safety:  Do you have a smoke alarm at your residence? Yes Do you have grab bars in the bathroom? No Do you have throw rugs in your house? Not asked   Cognitive Testing  Alert? Yes Normal Appearance?Yes  Oriented to person? Yes Place? Yes  Time?  Yes  Recall of three objects? Yes  Can perform simple calculations? Yes  Displays appropriate judgment?Yes  Can read the correct time from a watch face?Yes   List the Names of Other Physician/Practitioners you currently use:  See referral list for the physicians patient is currently seeing.  Cardiologist   Review of Systems: See above   Objective:     General appearance: Appears stated age  Head: Normocephalic, without obvious abnormality, atraumatic  Eyes: conj clear, EOMi PEERLA  Ears: normal TM's and external ear canals both ears  Nose: Nares normal. Septum midline. Mucosa normal. No drainage or sinus tenderness.  Throat: lips, mucosa, and tongue normal; teeth and gums normal  Neck: no adenopathy, no carotid bruit, no JVD, supple, symmetrical, trachea midline and thyroid not enlarged, symmetric, no tenderness/mass/nodules  No CVA tenderness.  Lungs: clear to auscultation bilaterally  Breasts: normal appearance, no masses or tenderness Heart: regular rate and rhythm, S1, S2 normal, no murmur, click, rub or gallop  Abdomen: soft, non-tender; bowel sounds normal; no masses, no organomegaly  Musculoskeletal: ROM normal in all joints, no crepitus, no deformity, Normal muscle strengthen. Back  is symmetric, no curvature. Skin: Skin color, texture, turgor normal. No rashes or lesions  Lymph nodes: Cervical, supraclavicular, and axillary nodes normal.  Neurologic: CN 2 -12 Normal, Normal symmetric reflexes. Normal coordination and gait  Psych: Alert & Oriented x 3, Mood appear stable.    Assessment:    Annual wellness medicare exam   Plan:    During the course of the visit the patient was educated and counseled about appropriate screening and preventive services including:   Colonoscopy up-to-date done in 2014     Patient Instructions (the written plan) was given to the patient.  Medicare Attestation  I have personally reviewed:  The patient's medical and social history   Their use of alcohol, tobacco or illicit drugs  Their current medications and supplements  The patient's functional ability including ADLs,fall risks, home safety risks, cognitive, and hearing and visual impairment  Diet and physical activities  Evidence for depression or mood disorders  The patient's weight, height, BMI, and visual acuity have been recorded in the chart. I have made referrals, counseling, and provided education to the patient based on review of the above and I have provided the patient with a written personalized care plan for preventive services.

## 2013-06-11 NOTE — Patient Instructions (Addendum)
Continue same medications and return in one year. Labs are pending. Please try to diet and lose a bit of weight

## 2013-09-19 ENCOUNTER — Encounter: Payer: Self-pay | Admitting: Hematology and Oncology

## 2013-09-19 ENCOUNTER — Other Ambulatory Visit (HOSPITAL_BASED_OUTPATIENT_CLINIC_OR_DEPARTMENT_OTHER): Payer: Medicare Other

## 2013-09-19 ENCOUNTER — Ambulatory Visit (HOSPITAL_BASED_OUTPATIENT_CLINIC_OR_DEPARTMENT_OTHER): Payer: Medicare Other | Admitting: Hematology and Oncology

## 2013-09-19 VITALS — BP 146/77 | HR 57 | Temp 98.2°F | Resp 20 | Ht 70.5 in | Wt 235.3 lb

## 2013-09-19 DIAGNOSIS — C099 Malignant neoplasm of tonsil, unspecified: Secondary | ICD-10-CM

## 2013-09-19 DIAGNOSIS — E039 Hypothyroidism, unspecified: Secondary | ICD-10-CM

## 2013-09-19 LAB — CBC WITH DIFFERENTIAL/PLATELET
BASO%: 0.7 % (ref 0.0–2.0)
BASOS ABS: 0 10*3/uL (ref 0.0–0.1)
EOS%: 4.5 % (ref 0.0–7.0)
Eosinophils Absolute: 0.2 10*3/uL (ref 0.0–0.5)
HCT: 41.1 % (ref 38.4–49.9)
HEMOGLOBIN: 13.6 g/dL (ref 13.0–17.1)
LYMPH#: 0.7 10*3/uL — AB (ref 0.9–3.3)
LYMPH%: 15.6 % (ref 14.0–49.0)
MCH: 26.8 pg — AB (ref 27.2–33.4)
MCHC: 33.2 g/dL (ref 32.0–36.0)
MCV: 80.7 fL (ref 79.3–98.0)
MONO#: 0.4 10*3/uL (ref 0.1–0.9)
MONO%: 9 % (ref 0.0–14.0)
NEUT%: 70.2 % (ref 39.0–75.0)
NEUTROS ABS: 3.1 10*3/uL (ref 1.5–6.5)
Platelets: 172 10*3/uL (ref 140–400)
RBC: 5.09 10*6/uL (ref 4.20–5.82)
RDW: 13.9 % (ref 11.0–14.6)
WBC: 4.4 10*3/uL (ref 4.0–10.3)

## 2013-09-19 LAB — COMPREHENSIVE METABOLIC PANEL (CC13)
ALBUMIN: 3.9 g/dL (ref 3.5–5.0)
ALT: 16 U/L (ref 0–55)
AST: 19 U/L (ref 5–34)
Alkaline Phosphatase: 86 U/L (ref 40–150)
Anion Gap: 7 mEq/L (ref 3–11)
BUN: 11.2 mg/dL (ref 7.0–26.0)
CALCIUM: 9.7 mg/dL (ref 8.4–10.4)
CHLORIDE: 104 meq/L (ref 98–109)
CO2: 29 mEq/L (ref 22–29)
Creatinine: 1 mg/dL (ref 0.7–1.3)
Glucose: 83 mg/dl (ref 70–140)
POTASSIUM: 3.9 meq/L (ref 3.5–5.1)
Sodium: 140 mEq/L (ref 136–145)
TOTAL PROTEIN: 7 g/dL (ref 6.4–8.3)
Total Bilirubin: 1.58 mg/dL — ABNORMAL HIGH (ref 0.20–1.20)

## 2013-09-19 LAB — TSH CHCC: TSH: 0.917 m(IU)/L (ref 0.320–4.118)
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
## 2014-04-04 ENCOUNTER — Ambulatory Visit (INDEPENDENT_AMBULATORY_CARE_PROVIDER_SITE_OTHER): Payer: Medicare Other | Admitting: Internal Medicine

## 2014-04-04 ENCOUNTER — Encounter: Payer: Self-pay | Admitting: Radiation Oncology

## 2014-04-04 ENCOUNTER — Ambulatory Visit
Admission: RE | Admit: 2014-04-04 | Discharge: 2014-04-04 | Disposition: A | Discharge status: Home / Self Care | Payer: Medicare Other | Source: Ambulatory Visit | Attending: Radiation Oncology | Admitting: Radiation Oncology

## 2014-04-04 DIAGNOSIS — Z23 Encounter for immunization: Secondary | ICD-10-CM

## 2014-04-04 DIAGNOSIS — C099 Malignant neoplasm of tonsil, unspecified: Secondary | ICD-10-CM

## 2014-04-04 NOTE — Progress Notes (Signed)
  Radiation Oncology         [phone] ________________________________  Name: Douglas Waters MRN: [id number]  Date: 04/04/2014  DOB: Apr 12, 1951  Follow-Up Visit Note  CC: Elby Showers, MD  Elby Showers, MD  Diagnosis:   Squamous cell carcinoma of the left tonsil  Interval Since Last Radiation:  4 years   Narrative:  The patient returns today for routine follow-up.  The patient states he is doing well. He continues to have a change in taste. He denies any dysphagia. No odynophagia. Overall he feels quite good.                              ALLERGIES:  is allergic to shrimp.  Meds: Current Outpatient Prescriptions  Medication Sig Dispense Refill  . buPROPion (WELLBUTRIN XL) 300 MG 24 hr tablet Take 1 tablet (300 mg total) by mouth every morning.  90 tablet  3  . fentaNYL (DURAGESIC - DOSED MCG/HR) 25 MCG/HR Place 1 patch onto the skin every 3 (three) days.        Marland Kitchen HYDROcodone-acetaminophen (NORCO) 7.5-325 MG per tablet Take 1 tablet by mouth daily.       . polyethylene glycol (MIRALAX / GLYCOLAX) packet Take 17 g by mouth daily.         No current facility-administered medications for this encounter.    Physical Findings: The patient is in no acute distress. Patient is alert and oriented.  vitals were not taken for this visit..   Oral cavity clear. Tonsillar and base of tongue region clear on exam including mirror exam. No cervical lymphadenopathy present  Lab Findings: Lab Results  Component Value Date   WBC 4.4 09/19/2013   HGB 13.6 09/19/2013   HCT 41.1 09/19/2013   MCV 80.7 09/19/2013   PLT 172 09/19/2013     Radiographic Findings: No results found.  Impression/plan:    The patient remains clinically NED. We will continue observation and he will followup in our clinic in one year. The patient will obtain a TSH test through his primary care physician at his next appointment.   I spent 10 minutes with the patient today, the majority of which was spent counseling the  patient on the diagnosis of cancer and coordinating care.   Jodelle Gross, M.D., Ph.D.

## 2014-06-17 ENCOUNTER — Other Ambulatory Visit: Payer: Medicare Other | Admitting: Internal Medicine

## 2014-06-17 ENCOUNTER — Encounter: Payer: Self-pay | Admitting: Internal Medicine

## 2014-06-17 ENCOUNTER — Ambulatory Visit (INDEPENDENT_AMBULATORY_CARE_PROVIDER_SITE_OTHER): Payer: Medicare Other | Admitting: Internal Medicine

## 2014-06-17 VITALS — BP 140/78 | HR 56 | Temp 98.1°F | Wt 225.0 lb

## 2014-06-17 DIAGNOSIS — C099 Malignant neoplasm of tonsil, unspecified: Secondary | ICD-10-CM

## 2014-06-17 DIAGNOSIS — G8929 Other chronic pain: Secondary | ICD-10-CM

## 2014-06-17 DIAGNOSIS — Z125 Encounter for screening for malignant neoplasm of prostate: Secondary | ICD-10-CM

## 2014-06-17 DIAGNOSIS — F32A Depression, unspecified: Secondary | ICD-10-CM

## 2014-06-17 DIAGNOSIS — M549 Dorsalgia, unspecified: Secondary | ICD-10-CM | POA: Diagnosis not present

## 2014-06-17 DIAGNOSIS — N400 Enlarged prostate without lower urinary tract symptoms: Secondary | ICD-10-CM | POA: Diagnosis not present

## 2014-06-17 DIAGNOSIS — F329 Major depressive disorder, single episode, unspecified: Secondary | ICD-10-CM

## 2014-06-17 DIAGNOSIS — Z79899 Other long term (current) drug therapy: Secondary | ICD-10-CM

## 2014-06-17 DIAGNOSIS — Z8679 Personal history of other diseases of the circulatory system: Secondary | ICD-10-CM | POA: Diagnosis not present

## 2014-06-17 DIAGNOSIS — Z0000 Encounter for general adult medical examination without abnormal findings: Secondary | ICD-10-CM | POA: Diagnosis not present

## 2014-06-17 DIAGNOSIS — Z9889 Other specified postprocedural states: Secondary | ICD-10-CM

## 2014-06-17 LAB — POCT URINALYSIS DIPSTICK
BILIRUBIN UA: NEGATIVE
Blood, UA: NEGATIVE
GLUCOSE UA: NEGATIVE
KETONES UA: NEGATIVE
LEUKOCYTES UA: NEGATIVE
Nitrite, UA: NEGATIVE
Protein, UA: NEGATIVE
SPEC GRAV UA: 1.015
Urobilinogen, UA: NEGATIVE
pH, UA: 6

## 2014-06-17 LAB — CBC WITH DIFFERENTIAL/PLATELET
BASOS ABS: 0 10*3/uL (ref 0.0–0.1)
Basophils Relative: 0 % (ref 0–1)
EOS PCT: 4 % (ref 0–5)
Eosinophils Absolute: 0.2 10*3/uL (ref 0.0–0.7)
HEMATOCRIT: 41.9 % (ref 39.0–52.0)
HEMOGLOBIN: 14.1 g/dL (ref 13.0–17.0)
LYMPHS ABS: 1 10*3/uL (ref 0.7–4.0)
LYMPHS PCT: 17 % (ref 12–46)
MCH: 27 pg (ref 26.0–34.0)
MCHC: 33.7 g/dL (ref 30.0–36.0)
MCV: 80.3 fL (ref 78.0–100.0)
MPV: 9.2 fL (ref 8.6–12.4)
Monocytes Absolute: 0.5 10*3/uL (ref 0.1–1.0)
Monocytes Relative: 8 % (ref 3–12)
NEUTROS ABS: 4.3 10*3/uL (ref 1.7–7.7)
Neutrophils Relative %: 71 % (ref 43–77)
PLATELETS: 193 10*3/uL (ref 150–400)
RBC: 5.22 MIL/uL (ref 4.22–5.81)
RDW: 13.6 % (ref 11.5–15.5)
WBC: 6.1 10*3/uL (ref 4.0–10.5)

## 2014-06-17 LAB — COMPREHENSIVE METABOLIC PANEL
ALBUMIN: 4.3 g/dL (ref 3.5–5.2)
ALT: 16 U/L (ref 0–53)
AST: 17 U/L (ref 0–37)
Alkaline Phosphatase: 96 U/L (ref 39–117)
BUN: 14 mg/dL (ref 6–23)
CHLORIDE: 101 meq/L (ref 96–112)
CO2: 31 meq/L (ref 19–32)
CREATININE: 1.02 mg/dL (ref 0.50–1.35)
Calcium: 9.9 mg/dL (ref 8.4–10.5)
Glucose, Bld: 94 mg/dL (ref 70–99)
POTASSIUM: 4.9 meq/L (ref 3.5–5.3)
Sodium: 139 mEq/L (ref 135–145)
Total Bilirubin: 1.7 mg/dL — ABNORMAL HIGH (ref 0.2–1.2)
Total Protein: 7 g/dL (ref 6.0–8.3)

## 2014-06-17 LAB — LIPID PANEL
CHOL/HDL RATIO: 4.5 ratio
Cholesterol: 165 mg/dL (ref 0–200)
HDL: 37 mg/dL — ABNORMAL LOW (ref >39)
LDL Cholesterol: 109 mg/dL — ABNORMAL HIGH (ref 0–99)
Triglycerides: 94 mg/dL (ref <150)
VLDL: 19 mg/dL (ref 0–40)

## 2014-06-17 LAB — TSH: TSH: 2.663 u[IU]/mL (ref 0.350–4.500)

## 2014-06-17 MED ORDER — TAMSULOSIN HCL 0.4 MG PO CAPS
0.4000 mg | ORAL_CAPSULE | Freq: Every day | ORAL | Status: DC
Start: 2014-06-17 — End: 2015-06-20

## 2014-06-17 NOTE — Patient Instructions (Signed)
Start tamsulosin for BPH symptoms. Return in one year or as needed. Lab work is pending.

## 2014-06-18 LAB — PSA: PSA: 0.85 ng/mL (ref <=4.00)

## 2014-06-19 NOTE — Progress Notes (Addendum)
Subjective:    Patient ID: Douglas Waters, male    DOB: [birth year], 64 y.o.   MRN: [id number]  HPI 64 year old White male on disability for chronic back pain in today for health maintenance and evaluation of medical issues. He has a history of coronary artery disease, supraventricular tachycardia status post radiofrequency ablation, status post lumbar hemilaminectomy L5-S1. He takes Wellbutrin for depression. He has a history of primary squamous cell carcinoma of the tonsil. He is almost 5 years out from treatment and has done well. In 2011, he had radiation therapy and received cisplatin.  He has had influenza immunization. Says he's had reaction to tetanus toxoid in the past and will not take that immunization.  History of chewing tobacco but never smoked. Does not consume alcohol.  Social history: He is married. Completed high school. 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 2 daughters and 2 stepdaughters.  Dr. Cristina Gong did repeat  colonoscopy February 2014.  History of cardiac catheterization 2004 showing a 25% proximal LAD stenosis. In September 2010, he had a radiofrequency catheter ablation of the easily inducible AV  NRT  by Dr. Lovena Le  Regarding chronic back pain history: He had a lumbar hemilaminectomy L5-S1 in July 1992 by Dr. Quentin Cornwall. In June 1995 he had an MRI of LS-spine showing some postoperative changes at L5-S1 with a mild bulge, discogenic edema on the right at L5 and degenerative disc disease with a bulge at L4-L5 but no fracture or herniated disc. In September 1995, he had an MRI of the thoracic spine which was negative. He then went [place] [facility] to do pain management in [place] where he had epidural steroid injections. He had facet joint injections in January 1996. He saw Dr. Claiborne Billings in [facility] for chronic back pain in April 2000 and was diagnosed with a degenerative disc. He saw Dr. Vickey Sages at [facility] in 1998 and was given a similar diagnosis. He went to [facility] in 2001 where he was tried on Neurontin and trazodone. This did not help his back pain. His spinal cord stimulator was discussed the patient declined. He then applied for disability because of chronic back issues.  Family History: Mother with history of MI and hypertension. Father with history of Alzheimer's disease and prostate cancer. 2 sisters in good health.  Review of Systems  Constitutional: Positive for fatigue.  HENT: Negative.   Eyes: Negative.   Respiratory: Negative.   Cardiovascular:       History of catheter ablation for AV NRT  Gastrointestinal: Negative.   Genitourinary:       Nocturia 3 times nightly  Musculoskeletal:       Chronic back pain  Skin: Negative.   Psychiatric/Behavioral:       History of depression       Objective:   Physical Exam  Constitutional: He is oriented to person, place, and time. He appears well-developed and well-nourished. No distress.  HENT:  Mouth/Throat: No oropharyngeal exudate.  Eyes: Conjunctivae and EOM are normal. Pupils are equal, round, and reactive to light. Right eye exhibits no discharge. Left eye exhibits no discharge. No scleral icterus.  Neck: Neck supple. No JVD present. No thyromegaly present.  Cardiovascular: Normal rate, regular rhythm, normal heart sounds and intact distal pulses.   No murmur heard. Pulmonary/Chest: Effort normal and breath sounds normal. No respiratory distress. He has no wheezes. He has no rales.  Abdominal: Soft. Bowel  sounds are normal. He exhibits no distension and no mass. There is no tenderness. There is no rebound and no guarding.  Genitourinary: Rectum normal.  Prostate enlarged without nodules  Musculoskeletal: Normal range of motion. He exhibits no edema.  Lymphadenopathy:    He has no cervical adenopathy.  Neurological: He is alert and oriented to person, place, and time. He has normal  reflexes. No cranial nerve deficit. Coordination normal.  Skin: Skin is warm and dry. No rash noted. He is not diaphoretic.  Psychiatric: He has a normal mood and affect. His behavior is normal. Judgment and thought content normal.  Vitals reviewed.         Assessment & Plan:  Nocturia-likely BPH. Trial of Flomax daily  Chronic back pain-on disability and chronic pain medication  History of AVNRT status post catheter ablation  History of coronary artery disease status post cardiac catheterization 2004  History of squamous cell carcinoma the tonsil treated with radiation and chemotherapy  Plan: Trial of Flomax for nocturia. Continue Wellbutrin. Return in one year or as needed. Fasting labs to be reviewed.  Addendum: PSA is normal.  He has lost 11 pounds since last year and could stand to lose a bit more. Try to encourage diet and exercise. Although he has limited ability to exercise due to chronic back pain.   Subjective:   Patient presents for Medicare Annual/Subsequent preventive examination.  Review Past Medical/Family/Social: see above   Risk Factors  Current exercise habits: sedentary Dietary issues discussed: low fat low carb  Cardiac risk factors:see above  Depression Screen  (Note: if answer to either of the following is "Yes", a more complete depression screening is indicated)   Over the past two weeks, have you felt down, depressed or hopeless?  no Over the past two weeks, have you felt little interest or pleasure in doing things? Some Have you lost interest or pleasure in daily life?  no Do you often feel hopeless? No Do you cry easily over simple problems? No - doing well on medication with depression  Activities of Daily Living  In your present state of health, do you have any difficulty performing the following activities?:   Driving? No  Managing money? No  Feeding yourself? No  Getting from bed to chair? No  Climbing a flight of stairs? No    Preparing food and eating?: No  Bathing or showering? No  Getting dressed: No  Getting to the toilet? No  Using the toilet:No  Moving around from place to place: yes because of back pain In the past year have you fallen or had a near fall?:No  Are you sexually active? yes Do you have more than one partner? No   Hearing Difficulties: No  Do you often ask people to speak up or repeat themselves? No  Do you experience ringing or noises in your ears? No  Do you have difficulty understanding soft or whispered voices? No  Do you feel that you have a problem with memory? No Do you often misplace items? No    Home Safety:  Do you have a smoke alarm at your residence? Yes Do you have grab bars in the bathroom? no Do you have throw rugs in your house? no   Cognitive Testing  Alert? Yes Normal Appearance?Yes  Oriented to person? Yes Place? Yes  Time? Yes  Recall of three objects? Yes  Can perform simple calculations? Yes  Displays appropriate judgment?Yes  Can read the correct time from a watch  face?Yes   List the Names of Other Physician/Practitioners you currently use:  See referral list for the physicians patient is currently seeing.  Oncologist Cardiologist   Review of Systems: See above new issue is nocturia   Objective:     General appearance: Appears stated age and mildly obese  Head: Normocephalic, without obvious abnormality, atraumatic  Eyes: conj clear, EOMi PEERLA  Ears: normal TM's and external ear canals both ears  Nose: Nares normal. Septum midline. Mucosa normal. No drainage or sinus tenderness.  Throat: lips, mucosa, and tongue normal; teeth and gums normal  Neck: no adenopathy, no carotid bruit, no JVD, supple, symmetrical, trachea midline and thyroid not enlarged, symmetric, no tenderness/mass/nodules  No CVA tenderness.  Lungs: clear to auscultation bilaterally  Breasts: normal appearance, no masses or tenderness Heart: regular rate and rhythm, S1, S2  normal, no murmur, click, rub or gallop  Abdomen: soft, non-tender; bowel sounds normal; no masses, no organomegaly  Musculoskeletal: ROM normal in all joints, no crepitus, no deformity, Normal muscle strengthen. Back  is symmetric, no curvature. Skin: Skin color, texture, turgor normal. No rashes or lesions  Lymph nodes: Cervical, supraclavicular, and axillary nodes normal.  Neurologic: CN 2 -12 Normal, Normal symmetric reflexes. Normal coordination and gait  Psych: Alert & Oriented x 3, Mood appear stable.    Assessment:    Annual wellness medicare exam   Plan:    During the course of the visit the patient was educated and counseled about appropriate screening and preventive services including:  Colonoscopy up-to-date  Refuses tetanus immunization status post reaction      Patient Instructions (the written plan) was given to the patient.  Medicare Attestation  I have personally reviewed:  The patient's medical and social history  Their use of alcohol, tobacco or illicit drugs  Their current medications and supplements  The patient's functional ability including ADLs,fall risks, home safety risks, cognitive, and hearing and visual impairment  Diet and physical activities  Evidence for depression or mood disorders  The patient's weight, height, BMI, and visual acuity have been recorded in the chart. I have made referrals, counseling, and provided education to the patient based on review of the above and I have provided the patient with a written personalized care plan for preventive services.

## 2014-08-19 ENCOUNTER — Other Ambulatory Visit: Payer: Self-pay | Admitting: Internal Medicine

## 2015-03-19 ENCOUNTER — Ambulatory Visit
Admission: RE | Admit: 2015-03-19 | Discharge: 2015-03-19 | Disposition: A | Discharge status: Home / Self Care | Payer: Medicare Other | Source: Ambulatory Visit | Attending: Radiation Oncology | Admitting: Radiation Oncology

## 2015-03-19 ENCOUNTER — Encounter: Payer: Self-pay | Admitting: Radiation Oncology

## 2015-03-19 VITALS — BP 126/73 | HR 52 | Temp 98.3°F | Resp 20 | Ht 70.0 in | Wt 227.4 lb

## 2015-03-19 DIAGNOSIS — C099 Malignant neoplasm of tonsil, unspecified: Secondary | ICD-10-CM

## 2015-03-19 NOTE — Progress Notes (Addendum)
Follow up s/p  Radiation 5 years of squamous cell left tonsil, still has taste issues, can tste bitter and sweets better, thick saliva at times, dry mouth, appetite good some days , energy level good, pain in low back right side, on fentanyl patch 35mcg and takes hydrocodone apap prn  Last TSH 06/17/14=2.663, sees Dr. Lucia Gaskins next week 1:32 PM  BP 126/73 mmHg  Pulse 52  Temp(Src) 98.3 F (36.8 C) (Oral)  Resp 20  Ht 5\' 10"  (1.778 m)  Wt 227 lb 6.4 oz (103.148 kg)  BMI 32.63 kg/m2  SpO2 98%  Wt Readings from Last 3 Encounters:  03/19/15 227 lb 6.4 oz (103.148 kg)  06/17/14 225 lb (102.059 kg)  09/19/13 235 lb 4.8 oz (106.731 kg)

## 2015-03-19 NOTE — Progress Notes (Signed)
  Radiation Oncology         [phone] ________________________________  Name: Douglas Waters MRN: [id number]  Date: 03/19/2015  DOB: May 07, 1951  Follow-Up Visit Note  CC: Elby Showers, MD  Elby Showers, MD  Diagnosis: Squamous cell carcinoma of the left tonsil  Interval Since Last Radiation:  5 years   Narrative:  The patient returns today for routine follow-up.  Follow up s/p Radiation 5 years of squamous cell left tonsil, still has taste issues, can taste bitter and sweets better, thick saliva at times, dry mouth, appetite good some days , energy level good, pain in low back right side, on fentanyl patch 1mcg and takes hydrocodone apap prn Last TSH 06/17/14=2.663, sees Dr. Lucia Gaskins next week.   Patient notes most foods taste different, some foods taste bad now.                              ALLERGIES:  is allergic to shrimp.  Meds: Current Outpatient Prescriptions  Medication Sig Dispense Refill  . buPROPion (WELLBUTRIN XL) 300 MG 24 hr tablet TAKE 1 TABLET (300 MG TOTAL) BY MOUTH EVERY MORNING. 90 tablet 3  . fentaNYL (DURAGESIC - DOSED MCG/HR) 25 MCG/HR Place 1 patch onto the skin every 3 (three) days.      Marland Kitchen HYDROcodone-acetaminophen (NORCO) 7.5-325 MG per tablet Take 1 tablet by mouth daily.     Marland Kitchen ibuprofen (ADVIL,MOTRIN) 800 MG tablet Take 800 mg by mouth every 8 (eight) hours as needed for headache.    . polyethylene glycol (MIRALAX / GLYCOLAX) packet Take 17 g by mouth daily.      . tamsulosin (FLOMAX) 0.4 MG CAPS capsule Take 1 capsule (0.4 mg total) by mouth daily. (Patient not taking: Reported on 03/19/2015) 30 capsule 3   No current facility-administered medications for this encounter.    Physical Findings:  The patient is in no acute distress. Patient is alert and oriented.  height is 5\' 10"  (1.778 m) and weight is 227 lb 6.4 oz (103.148 kg). His oral temperature is 98.3 F (36.8 C). His blood pressure is 126/73 and his pulse is 52. His respiration is 20 and  oxygen saturation is 98%. .   General: Well-developed, in no acute distress HEENT: Normocephalic, atraumatic. Cardiovascular: Regular rate and rhythm Respiratory: Clear to auscultation bilaterally GI: Soft, nontender, normal bowel sounds Extremities: No edema present Oral Exam: oral cavity clear, good view of oropharynx. Mild fibrosis present with small degree of swelling on the left.   Lab Findings: Lab Results  Component Value Date   WBC 6.1 06/17/2014   HGB 14.1 06/17/2014   HCT 41.9 06/17/2014   MCV 80.3 06/17/2014   PLT 193 06/17/2014     Radiographic Findings: No results found.  Impression: The patient is clinically stable.   Plan: The patient has done well and will follow up with the clinic as needed.   ------------------------------------------------  Jodelle Gross, MD, PhD  This document serves as a record of services personally performed by Kyung Rudd, MD. It was created on his behalf by Derek Mound, a trained medical scribe. The creation of this record is based on the scribe's personal observations and the provider's statements to them. This document has been checked and approved by the attending provider.

## 2015-04-22 ENCOUNTER — Ambulatory Visit (INDEPENDENT_AMBULATORY_CARE_PROVIDER_SITE_OTHER): Payer: Medicare Other | Admitting: Internal Medicine

## 2015-04-22 VITALS — BP 114/78

## 2015-04-22 DIAGNOSIS — Z23 Encounter for immunization: Secondary | ICD-10-CM

## 2015-06-20 ENCOUNTER — Other Ambulatory Visit: Payer: Medicare Other | Admitting: Internal Medicine

## 2015-06-20 ENCOUNTER — Encounter: Payer: Self-pay | Admitting: Internal Medicine

## 2015-06-20 ENCOUNTER — Ambulatory Visit (INDEPENDENT_AMBULATORY_CARE_PROVIDER_SITE_OTHER): Payer: Medicare Other | Admitting: Internal Medicine

## 2015-06-20 VITALS — BP 130/80 | HR 52 | Temp 98.0°F | Resp 20 | Ht 70.5 in | Wt 242.0 lb

## 2015-06-20 DIAGNOSIS — Z0000 Encounter for general adult medical examination without abnormal findings: Secondary | ICD-10-CM | POA: Diagnosis not present

## 2015-06-20 DIAGNOSIS — Z9889 Other specified postprocedural states: Secondary | ICD-10-CM | POA: Diagnosis not present

## 2015-06-20 DIAGNOSIS — Z8679 Personal history of other diseases of the circulatory system: Secondary | ICD-10-CM

## 2015-06-20 DIAGNOSIS — Z13220 Encounter for screening for lipoid disorders: Secondary | ICD-10-CM

## 2015-06-20 DIAGNOSIS — Z79899 Other long term (current) drug therapy: Secondary | ICD-10-CM | POA: Diagnosis not present

## 2015-06-20 DIAGNOSIS — Z125 Encounter for screening for malignant neoplasm of prostate: Secondary | ICD-10-CM

## 2015-06-20 DIAGNOSIS — G8929 Other chronic pain: Secondary | ICD-10-CM

## 2015-06-20 DIAGNOSIS — M549 Dorsalgia, unspecified: Secondary | ICD-10-CM

## 2015-06-20 DIAGNOSIS — F329 Major depressive disorder, single episode, unspecified: Secondary | ICD-10-CM | POA: Diagnosis not present

## 2015-06-20 DIAGNOSIS — Z23 Encounter for immunization: Secondary | ICD-10-CM

## 2015-06-20 DIAGNOSIS — C099 Malignant neoplasm of tonsil, unspecified: Secondary | ICD-10-CM

## 2015-06-20 DIAGNOSIS — F32A Depression, unspecified: Secondary | ICD-10-CM

## 2015-06-20 DIAGNOSIS — N400 Enlarged prostate without lower urinary tract symptoms: Secondary | ICD-10-CM

## 2015-06-20 LAB — CBC WITH DIFFERENTIAL/PLATELET
BASOS ABS: 0 10*3/uL (ref 0.0–0.1)
Basophils Relative: 0 % (ref 0–1)
EOS PCT: 6 % — AB (ref 0–5)
Eosinophils Absolute: 0.3 10*3/uL (ref 0.0–0.7)
HEMATOCRIT: 41.4 % (ref 39.0–52.0)
Hemoglobin: 14 g/dL (ref 13.0–17.0)
LYMPHS ABS: 0.9 10*3/uL (ref 0.7–4.0)
LYMPHS PCT: 18 % (ref 12–46)
MCH: 26.4 pg (ref 26.0–34.0)
MCHC: 33.8 g/dL (ref 30.0–36.0)
MCV: 78.1 fL (ref 78.0–100.0)
MONOS PCT: 8 % (ref 3–12)
MPV: 9.1 fL (ref 8.6–12.4)
Monocytes Absolute: 0.4 10*3/uL (ref 0.1–1.0)
Neutro Abs: 3.4 10*3/uL (ref 1.7–7.7)
Neutrophils Relative %: 68 % (ref 43–77)
Platelets: 194 10*3/uL (ref 150–400)
RBC: 5.3 MIL/uL (ref 4.22–5.81)
RDW: 13.7 % (ref 11.5–15.5)
WBC: 5 10*3/uL (ref 4.0–10.5)

## 2015-06-20 LAB — COMPLETE METABOLIC PANEL WITH GFR
ALT: 17 U/L (ref 9–46)
AST: 19 U/L (ref 10–35)
Albumin: 4.3 g/dL (ref 3.6–5.1)
Alkaline Phosphatase: 85 U/L (ref 40–115)
BILIRUBIN TOTAL: 1.7 mg/dL — AB (ref 0.2–1.2)
BUN: 16 mg/dL (ref 7–25)
CALCIUM: 9.6 mg/dL (ref 8.6–10.3)
CO2: 26 mmol/L (ref 20–31)
CREATININE: 1.04 mg/dL (ref 0.70–1.25)
Chloride: 100 mmol/L (ref 98–110)
GFR, EST AFRICAN AMERICAN: 87 mL/min (ref >=60)
GFR, EST NON AFRICAN AMERICAN: 75 mL/min (ref >=60)
Glucose, Bld: 102 mg/dL — ABNORMAL HIGH (ref 65–99)
Potassium: 4.2 mmol/L (ref 3.5–5.3)
Sodium: 136 mmol/L (ref 135–146)
TOTAL PROTEIN: 6.8 g/dL (ref 6.1–8.1)

## 2015-06-20 LAB — LIPID PANEL
CHOLESTEROL: 159 mg/dL (ref 125–200)
HDL: 41 mg/dL (ref >=40)
LDL Cholesterol: 104 mg/dL (ref <130)
TRIGLYCERIDES: 68 mg/dL (ref <150)
Total CHOL/HDL Ratio: 3.9 Ratio (ref <=5.0)
VLDL: 14 mg/dL (ref <30)

## 2015-06-20 LAB — POCT URINALYSIS DIPSTICK
Bilirubin, UA: NEGATIVE
Blood, UA: NEGATIVE
GLUCOSE UA: NEGATIVE
Ketones, UA: NEGATIVE
LEUKOCYTES UA: NEGATIVE
NITRITE UA: NEGATIVE
PROTEIN UA: NEGATIVE
Spec Grav, UA: 1.02
UROBILINOGEN UA: 0.2
pH, UA: 6.5

## 2015-06-20 MED ORDER — TAMSULOSIN HCL 0.4 MG PO CAPS: 0.4000 mg | ORAL_CAPSULE | Freq: Every day | ORAL | Status: DC

## 2015-06-20 MED ORDER — BUPROPION HCL ER (XL) 300 MG PO TB24: ORAL_TABLET | ORAL | Status: DC

## 2015-06-20 NOTE — Patient Instructions (Addendum)
Refilled Wellbutrin and Flomax RTC one year or prn. Prevnar given.

## 2015-06-21 LAB — PSA: PSA: 0.73 ng/mL (ref <=4.00)

## 2015-06-24 NOTE — Progress Notes (Signed)
Subjective:    Patient ID: Douglas Waters, male    DOB: 16-May-1951, 65 y.o.   MRN: [id number]  HPI 65 year old White Male with history of chronic disability due to back pain in for health maintenance exam and evaluation of medical problems. Patient is seen at pain clinic and is on fentanyl patch and hydrocodone. History of depression related chronic back pain treated with Wellbutrin. Has nocturia. Said initially that Flomax had not helped BPH but agrees to try again. He has a history of coronary artery disease, supraventricular tachycardia status post radiofrequency ablation. History of lumbar hemilaminectomy L5-S1. History of primary squamous cell carcinoma of the tonsil and is 6 years out from treatment. He has done well. He had radiation therapy and received cisplatin.  Says he's had reaction to tetanus toxoid immunization in the past and declines to take that.  History of chewing tobacco but never smoked. Does not consume alcohol.  Dr. Cristina Gong did colonoscopy February 2014.  Patient had cardiac catheterization 2004 showing a 25% proximal LAD stenosis. In September 2010 he had radiofrequency catheter ablation of easily inducible AV  NRT  by Dr. Lovena Le.  Regarding chronic back pain history: He had a lumbar hemilaminectomy L5-S1 in July 1992 by Dr. Quentin Cornwall. In June 1995, he had an MRI of LS-spine showing some postoperative changes at L5-S1 with a mild bulge, discogenic edema on the right at L5 and degenerative disc disease with a bulge at L4-L5 but no fracture or herniated disc. In September 1995, he had an MRI of the thoracic spine which was negative. Subsequently with pain management clinic in [place] where he had epidural steroid injections. He had facet joint injections in January 1996. He saw Dr. Claiborne Billings in [facility] for chronic back pain in April 2000 and was diagnosed with a degenerative disc disc. He saw Dr. Vickey Sages at [facility] in 1998 was given a similar  diagnosis. He went to [facility] in 2001 where he was tried on Neurontin and trazodone. This did not help back pain. Spinal cord stimulator was discussed but patient declined. He then applied for disability because of chronic back issues.  Family history: Mother with history of MI and hypertension. Father with history of Alzheimer's disease and prostate cancer. 2 sisters in good health.         Review of Systems nocturia 3 nightly, chronic back pain requiring narcotic pain medication, history of depression     Objective:   Physical Exam  Constitutional: He is oriented to person, place, and time. He appears well-developed and well-nourished. No distress.  HENT:  Head: Normocephalic and atraumatic.  Right Ear: External ear normal.  Left Ear: External ear normal.  Mouth/Throat: Oropharynx is clear and moist. No oropharyngeal exudate.  Eyes: Conjunctivae and EOM are normal. Pupils are equal, round, and reactive to light. Right eye exhibits no discharge. Left eye exhibits no discharge. No scleral icterus.  Neck: Neck supple. No JVD present. No thyromegaly present.  Cardiovascular: Normal rate, regular rhythm, normal heart sounds and intact distal pulses.   No murmur heard. Pulmonary/Chest: Effort normal and breath sounds normal. No respiratory distress. He has no wheezes. He has no rales. He exhibits no tenderness.  Abdominal: Soft. Bowel sounds are normal. He exhibits no distension. There is no tenderness. There is no rebound and no guarding.  Genitourinary:  Prostate enlarged without nodules  Musculoskeletal: Normal range of motion. He exhibits no edema.  Lymphadenopathy:    He has no cervical  adenopathy.  Neurological: He is alert and oriented to person, place, and time. He has normal reflexes. He displays normal reflexes. No cranial nerve deficit. Coordination normal.  Skin: Skin is warm and dry. No rash noted. He is not diaphoretic.  Psychiatric:  He has a normal mood and affect. His behavior is normal. Judgment and thought content normal.  Vitals reviewed.         Assessment & Plan:  Benign prostatic hyperplasia-said initially Flomax did not help but agrees to try again  Chronic back pain  History of tachycardia requiring ablation AV NRT  Squamous cell carcinoma the tonsil  History of depression  Coronary artery disease  Plan: Return in one year or as needed. Continue same medications. Pain medication to be prescribed by pain clinic. Refill Flomax. Prevnar given.  Subjective:   Patient presents for Medicare Annual/Subsequent preventive examination.  Review Past Medical/Family/Social: See above   Risk Factors  Current exercise habits: Sedentary due to back pain Dietary issues discussed: Low fat low carbohydrate  Cardiac risk factors: History of coronary artery disease  Depression Screen  (Note: if answer to either of the following is "Yes", a more complete depression screening is indicated)   Over the past two weeks, have you felt down, depressed or hopeless? No  Over the past two weeks, have you felt little interest or pleasure in doing things? No Have you lost interest or pleasure in daily life? No Do you often feel hopeless? No Do you cry easily over simple problems? No   Activities of Daily Living  In your present state of health, do you have any difficulty performing the following activities?:   Driving? No  Managing money? No  Feeding yourself? No  Getting from bed to chair? No  Climbing a flight of stairs? No  Preparing food and eating?: No  Bathing or showering? No  Getting dressed: No  Getting to the toilet? No  Using the toilet:No  Moving around from place to place: No  In the past year have you fallen or had a near fall?:No  Are you sexually active? No  Do you have more than one partner? No   Hearing Difficulties: No  Do you often ask people to speak up or repeat themselves? No  Do you  experience ringing or noises in your ears? No  Do you have difficulty understanding soft or whispered voices? No  Do you feel that you have a problem with memory? No Do you often misplace items? No    Home Safety:  Do you have a smoke alarm at your residence? Yes Do you have grab bars in the bathroom? No Do you have throw rugs in your house? No Cognitive Testing  Alert? Yes Normal Appearance?Yes  Oriented to person? Yes Place? Yes  Time? Yes  Recall of three objects? Yes  Can perform simple calculations? Yes  Displays appropriate judgment?Yes  Can read the correct time from a watch face?Yes   List the Names of Other Physician/Practitioners you currently use:  See referral list for the physicians patient is currently seeing.  Pain clinic physician    Review of Systems: See above   Objective:     General appearance: Appears stated age and mildly obese  Head: Normocephalic, without obvious abnormality, atraumatic  Eyes: conj clear, EOMi PEERLA  Ears: normal TM's and external ear canals both ears  Nose: Nares normal. Septum midline. Mucosa normal. No drainage or sinus tenderness.  Throat: lips, mucosa, and tongue normal; teeth and  gums normal  Neck: no adenopathy, no carotid bruit, no JVD, supple, symmetrical, trachea midline and thyroid not enlarged, symmetric, no tenderness/mass/nodules  No CVA tenderness.  Lungs: clear to auscultation bilaterally  Breasts: normal appearance, no masses or tenderness. Heart: regular rate and rhythm normal S1 and S2.  Abdomen: soft, non-tender; bowel sounds normal; no masses, no organomegaly  Musculoskeletal: ROM normal in all joints, no crepitus, no deformity, Normal muscle strengthen. Back  is symmetric, no curvature. Skin: Skin color, texture, turgor normal. No rashes or lesions  Lymph nodes: Cervical, supraclavicular, and axillary nodes normal.  Neurologic: CN 2 -12 Normal, Normal symmetric reflexes. Normal coordination and gait  Psych:  Alert & Oriented x 3, Mood appear stable.    Assessment:    Annual wellness medicare exam   Plan:    During the course of the visit the patient was educated and counseled about appropriate screening and preventive services including:   Annual flu vaccine  Annual PSA     Patient Instructions (the written plan) was given to the patient.  Medicare Attestation  I have personally reviewed:  The patient's medical and social history  Their use of alcohol, tobacco or illicit drugs  Their current medications and supplements  The patient's functional ability including ADLs,fall risks, home safety risks, cognitive, and hearing and visual impairment  Diet and physical activities  Evidence for depression or mood disorders  The patient's weight, height, BMI, and visual acuity have been recorded in the chart. I have made referrals, counseling, and provided education to the patient based on review of the above and I have provided the patient with a written personalized care plan for preventive services.

## 2015-07-09 ENCOUNTER — Encounter: Payer: Self-pay | Admitting: Internal Medicine

## 2015-08-26 ENCOUNTER — Encounter (HOSPITAL_COMMUNITY): Payer: Self-pay

## 2015-08-26 ENCOUNTER — Emergency Department (HOSPITAL_COMMUNITY)
Admission: EM | Admit: 2015-08-26 | Discharge: 2015-08-26 | Disposition: A | Discharge status: Home / Self Care | Payer: Medicare Other | Attending: Emergency Medicine | Admitting: Emergency Medicine

## 2015-08-26 ENCOUNTER — Emergency Department (HOSPITAL_COMMUNITY): Payer: Medicare Other

## 2015-08-26 DIAGNOSIS — Z923 Personal history of irradiation: Secondary | ICD-10-CM | POA: Insufficient documentation

## 2015-08-26 DIAGNOSIS — G8929 Other chronic pain: Secondary | ICD-10-CM | POA: Insufficient documentation

## 2015-08-26 DIAGNOSIS — S51811A Laceration without foreign body of right forearm, initial encounter: Secondary | ICD-10-CM | POA: Diagnosis not present

## 2015-08-26 DIAGNOSIS — F329 Major depressive disorder, single episode, unspecified: Secondary | ICD-10-CM | POA: Diagnosis not present

## 2015-08-26 DIAGNOSIS — Y9289 Other specified places as the place of occurrence of the external cause: Secondary | ICD-10-CM | POA: Diagnosis not present

## 2015-08-26 DIAGNOSIS — Z85818 Personal history of malignant neoplasm of other sites of lip, oral cavity, and pharynx: Secondary | ICD-10-CM | POA: Insufficient documentation

## 2015-08-26 DIAGNOSIS — S61411A Laceration without foreign body of right hand, initial encounter: Secondary | ICD-10-CM | POA: Diagnosis not present

## 2015-08-26 DIAGNOSIS — S61512A Laceration without foreign body of left wrist, initial encounter: Secondary | ICD-10-CM | POA: Insufficient documentation

## 2015-08-26 DIAGNOSIS — S61011A Laceration without foreign body of right thumb without damage to nail, initial encounter: Secondary | ICD-10-CM | POA: Insufficient documentation

## 2015-08-26 DIAGNOSIS — Z8679 Personal history of other diseases of the circulatory system: Secondary | ICD-10-CM | POA: Diagnosis not present

## 2015-08-26 DIAGNOSIS — S61412A Laceration without foreign body of left hand, initial encounter: Secondary | ICD-10-CM | POA: Diagnosis not present

## 2015-08-26 DIAGNOSIS — Y998 Other external cause status: Secondary | ICD-10-CM | POA: Diagnosis not present

## 2015-08-26 DIAGNOSIS — M1990 Unspecified osteoarthritis, unspecified site: Secondary | ICD-10-CM | POA: Insufficient documentation

## 2015-08-26 DIAGNOSIS — Y9389 Activity, other specified: Secondary | ICD-10-CM | POA: Diagnosis not present

## 2015-08-26 DIAGNOSIS — Z8719 Personal history of other diseases of the digestive system: Secondary | ICD-10-CM | POA: Insufficient documentation

## 2015-08-26 DIAGNOSIS — S6991XA Unspecified injury of right wrist, hand and finger(s), initial encounter: Secondary | ICD-10-CM | POA: Diagnosis present

## 2015-08-26 MED ORDER — CEPHALEXIN 500 MG PO CAPS: 500.0000 mg | ORAL_CAPSULE | Freq: Four times a day (QID) | ORAL | Status: DC

## 2015-08-26 MED ORDER — LIDOCAINE-EPINEPHRINE 1 %-1:100000 IJ SOLN
30.0000 mL | Freq: Once | INTRAMUSCULAR | Status: AC
  Administered 2015-08-26: 30 mL
  Filled 2015-08-26: qty 1

## 2015-08-26 MED ORDER — BACITRACIN ZINC 500 UNIT/GM EX OINT
TOPICAL_OINTMENT | CUTANEOUS | Status: AC
  Filled 2015-08-26: qty 0.9

## 2015-08-26 MED ORDER — BACITRACIN ZINC 500 UNIT/GM EX OINT: TOPICAL_OINTMENT | CUTANEOUS | Status: AC

## 2015-08-26 MED ORDER — CEPHALEXIN 500 MG PO CAPS
500.0000 mg | ORAL_CAPSULE | Freq: Once | ORAL | Status: AC
  Administered 2015-08-26: 500 mg via ORAL
  Filled 2015-08-26: qty 1

## 2015-08-26 NOTE — Discharge Instructions (Signed)
Please read and follow all provided instructions.  Your diagnoses today include:  1. Hand laceration, right, initial encounter   2. Hand laceration, left, initial encounter   3. Laceration of forearm, right, initial encounter   4. Laceration of right thumb, initial encounter    Tests performed today include:  X-ray of the affected area that did not show any foreign bodies or broken bones  Vital signs. See below for your results today.   Medications prescribed:   Keflex (cephalexin) - antibiotic  You have been prescribed an antibiotic medicine: take the entire course of medicine even if you are feeling better. Stopping early can cause the antibiotic not to work.  Take any prescribed medications only as directed.   Home care instructions:  Follow any educational materials and wound care instructions contained in this packet.   Keep affected area above the level of your heart when possible to minimize swelling. Wash area gently twice a day with warm soapy water. Do not apply alcohol or hydrogen peroxide. Cover the area if it draining or weeping.   Follow-up instructions: Suture Removal: Return to the Emergency Department or see your primary care care doctor in 10 days for a recheck of your wound and removal of your sutures or staples.    Return instructions:  Return to the Emergency Department if you have:  Fever  Worsening pain  Worsening swelling of the wound  Pus draining from the wound  Redness of the skin that moves away from the wound, especially if it streaks away from the affected area   Any other emergent concerns  Your vital signs today were: BP 189/79 mmHg   Pulse 70   Temp(Src) 98.2 F (36.8 C) (Oral)   Resp 18   SpO2 96% If your blood pressure (BP) was elevated above 135/85 this visit, please have this repeated by your doctor within one month. --------------
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
## 2015-10-17 ENCOUNTER — Ambulatory Visit: Payer: Self-pay | Admitting: Internal Medicine

## 2015-10-28 ENCOUNTER — Encounter: Payer: Self-pay | Admitting: Internal Medicine

## 2015-10-28 ENCOUNTER — Ambulatory Visit (INDEPENDENT_AMBULATORY_CARE_PROVIDER_SITE_OTHER): Payer: Medicare Other | Admitting: Internal Medicine

## 2015-10-28 VITALS — BP 142/76 | HR 70 | Temp 97.6°F | Resp 18 | Ht 71.0 in | Wt 225.0 lb

## 2015-10-28 DIAGNOSIS — N400 Enlarged prostate without lower urinary tract symptoms: Secondary | ICD-10-CM

## 2015-10-28 DIAGNOSIS — N5319 Other ejaculatory dysfunction: Secondary | ICD-10-CM

## 2015-10-28 DIAGNOSIS — N3943 Post-void dribbling: Secondary | ICD-10-CM | POA: Diagnosis not present

## 2015-10-28 LAB — POCT URINALYSIS DIPSTICK
Bilirubin, UA: NEGATIVE
Blood, UA: NEGATIVE
GLUCOSE UA: NEGATIVE
KETONES UA: NEGATIVE
LEUKOCYTES UA: NEGATIVE
Nitrite, UA: NEGATIVE
PROTEIN UA: NEGATIVE
Spec Grav, UA: >=1.03
Urobilinogen, UA: 0.2
pH, UA: 7.5

## 2015-10-28 NOTE — Progress Notes (Signed)
   Subjective:    Patient ID: Douglas Waters, male    DOB: [birth year], 65 y.o.   MRN: [id number]  HPI Patient started Flomax in mid January for BPH symptoms. He was having a lot of nocturia. Sometimes would have some urge urinary incontinence. Has noticed he doesn't have an ejaculate. Doesn't have sex as much as he used to maybe 1 or 2 times a month. He notices after starting the Flomax. Up to 18% percent of patients on Flomax can have ejaculation issues. Doubt this is harmful but one way to see if medication is causing it is for him to discontinue medication. He is worried about his prostate. His PSA in January was normal. He has no dysuria.     Review of Systems     Objective:   Physical Exam  Prostate is normal. It is not boggy. Patient appears to be volume depleted with high specific gravity of urine. Urine is dark. Set for microscopic. Dipstick does not show significant urobilinogen. Recent lab work in January showed no issues with liver.      Assessment & Plan:  Unable to ejaculate-likely due to Flomax  Plan: Stop Flomax for a month and see if situation improves. He is to call and let us know.

## 2015-10-28 NOTE — Patient Instructions (Signed)
Discontinue Flomax and call with progress report in 4 weeks.

## 2015-10-29 LAB — URINALYSIS, ROUTINE W REFLEX MICROSCOPIC
Bilirubin Urine: NEGATIVE
GLUCOSE, UA: NEGATIVE
Hgb urine dipstick: NEGATIVE
LEUKOCYTES UA: NEGATIVE
Nitrite: NEGATIVE
SPECIFIC GRAVITY, URINE: 1.03 (ref 1.001–1.035)
pH: 5.5 (ref 5.0–8.0)

## 2016-02-25 ENCOUNTER — Other Ambulatory Visit: Payer: Self-pay | Admitting: Internal Medicine

## 2016-04-22 ENCOUNTER — Ambulatory Visit (INDEPENDENT_AMBULATORY_CARE_PROVIDER_SITE_OTHER): Payer: Medicare Other | Admitting: Internal Medicine

## 2016-04-22 DIAGNOSIS — Z23 Encounter for immunization: Secondary | ICD-10-CM

## 2016-06-29 ENCOUNTER — Encounter: Payer: Self-pay | Admitting: Internal Medicine

## 2016-06-29 ENCOUNTER — Ambulatory Visit (INDEPENDENT_AMBULATORY_CARE_PROVIDER_SITE_OTHER): Payer: Medicare Other | Admitting: Internal Medicine

## 2016-06-29 VITALS — BP 110/60 | HR 73 | Wt 230.0 lb

## 2016-06-29 DIAGNOSIS — R05 Cough: Secondary | ICD-10-CM | POA: Diagnosis not present

## 2016-06-29 DIAGNOSIS — R059 Cough, unspecified: Secondary | ICD-10-CM

## 2016-06-29 DIAGNOSIS — J069 Acute upper respiratory infection, unspecified: Secondary | ICD-10-CM

## 2016-06-29 MED ORDER — AZITHROMYCIN 250 MG PO TABS: ORAL_TABLET | ORAL | 0 refills | Status: DC

## 2016-06-29 NOTE — Progress Notes (Signed)
   Subjective:    Patient ID: Douglas Waters, male    DOB: May 30, 1951, 66 y.o.   MRN: [id number]  HPI Wife has been sick with bronchitis symptoms for several weeks. He recently came down with URI symptoms with runny nose and slight cough. No shaking chills or myalgias. Apparently has been exposed to flu in several family members as well as strep throat.    Review of Systems see above     Objective:   Physical Exam TMs and pharynx are clear. Neck is supple without adenopathy. Chest clear to auscultation.  Also asking about a lesion that looks like a keratosis near right sideburn. It gets irritated with shaving. We will refer him to dermatologist for this.       Assessment & Plan:  Acute URI-rapid flu test pending  Plan: Zithromax Z-PAK take 2 tablets day 1 followed by 1 tablet days 2 through 5. May take Chlor-Trimeton for runny nose. May take over-the-counter Delsym for cough.

## 2016-06-29 NOTE — Patient Instructions (Signed)
Take Zithromax Z-PAK 2 tablets day one followed by 1 tablet days 2 through 5. Delsym as needed for cough. Chlor-Trimeton if needed for runny nose. Rest and drink plenty of fluids. Rapid flu test pending.

## 2016-06-30 LAB — INFLUENZA A AND B AG, IMMUNOASSAY
INFLUENZA A ANTIGEN: NOT DETECTED
Influenza B Antigen: NOT DETECTED

## 2016-07-02 ENCOUNTER — Encounter: Payer: Self-pay | Admitting: Internal Medicine

## 2016-07-02 ENCOUNTER — Telehealth: Payer: Self-pay | Admitting: Internal Medicine

## 2016-07-02 NOTE — Telephone Encounter (Signed)
Spoke with patient's wife, Jeannene Patella on Tuesday, 1/16 to advise of Dermatology appointment.  Advised that Dr. Renold Genta said that patient had previously seen Dr. Nevada Crane.  Spoke with Dr. Juel Burrow office and set up patient for Wed. 07/07/16 @ 1200.  Dx:  Keratosis on right cheek bone near the ear.  Patient's wife confirmed the appointment.  Gave her address and phone number to Dr. Juel Burrow office.    Dr. Nevada Crane [address] [phone]

## 2016-07-02 NOTE — Telephone Encounter (Signed)
Wife called saying patient was nauseated, lying in bed, running a fever. Says he had a lot of postnasal drip. Would not take over-the-counter Chlor-Trimeton as recommended. Told her that his flu test was negative. Had texted her that yesterday but she does not apparently text. Reminded her that he had a Zithromax Z-Pak prescribed at time of office visit. She did not realize that but has the prescription there at the house. She will start that. She says he has some nausea medication from when he had oral cancer and she will try that.

## 2016-08-05 ENCOUNTER — Ambulatory Visit (INDEPENDENT_AMBULATORY_CARE_PROVIDER_SITE_OTHER): Payer: Medicare Other | Admitting: Internal Medicine

## 2016-08-05 ENCOUNTER — Encounter: Payer: Self-pay | Admitting: Internal Medicine

## 2016-08-05 VITALS — BP 130/70 | HR 78 | Temp 97.9°F | Ht 70.0 in | Wt 246.0 lb

## 2016-08-05 DIAGNOSIS — I471 Supraventricular tachycardia: Secondary | ICD-10-CM | POA: Diagnosis not present

## 2016-08-05 DIAGNOSIS — Z0000 Encounter for general adult medical examination without abnormal findings: Secondary | ICD-10-CM

## 2016-08-05 DIAGNOSIS — Z13220 Encounter for screening for lipoid disorders: Secondary | ICD-10-CM

## 2016-08-05 DIAGNOSIS — C099 Malignant neoplasm of tonsil, unspecified: Secondary | ICD-10-CM

## 2016-08-05 DIAGNOSIS — Z125 Encounter for screening for malignant neoplasm of prostate: Secondary | ICD-10-CM

## 2016-08-05 DIAGNOSIS — Z8659 Personal history of other mental and behavioral disorders: Secondary | ICD-10-CM | POA: Diagnosis not present

## 2016-08-05 DIAGNOSIS — G8929 Other chronic pain: Secondary | ICD-10-CM

## 2016-08-05 DIAGNOSIS — M545 Low back pain: Secondary | ICD-10-CM | POA: Diagnosis not present

## 2016-08-05 DIAGNOSIS — I2510 Atherosclerotic heart disease of native coronary artery without angina pectoris: Secondary | ICD-10-CM

## 2016-08-05 DIAGNOSIS — I4719 Other supraventricular tachycardia: Secondary | ICD-10-CM

## 2016-08-05 DIAGNOSIS — Z9889 Other specified postprocedural states: Secondary | ICD-10-CM

## 2016-08-05 LAB — LIPID PANEL
CHOL/HDL RATIO: 4.3 ratio (ref <5.0)
Cholesterol: 175 mg/dL (ref <200)
HDL: 41 mg/dL (ref >40)
LDL CALC: 116 mg/dL — AB (ref <100)
TRIGLYCERIDES: 91 mg/dL (ref <150)
VLDL: 18 mg/dL (ref <30)

## 2016-08-05 LAB — COMPREHENSIVE METABOLIC PANEL
ALBUMIN: 4.4 g/dL (ref 3.6–5.1)
ALT: 14 U/L (ref 9–46)
AST: 19 U/L (ref 10–35)
Alkaline Phosphatase: 83 U/L (ref 40–115)
BUN: 13 mg/dL (ref 7–25)
CALCIUM: 10 mg/dL (ref 8.6–10.3)
CHLORIDE: 103 mmol/L (ref 98–110)
CO2: 25 mmol/L (ref 20–31)
Creat: 1.07 mg/dL (ref 0.70–1.25)
Glucose, Bld: 108 mg/dL — ABNORMAL HIGH (ref 65–99)
Potassium: 4.2 mmol/L (ref 3.5–5.3)
Sodium: 140 mmol/L (ref 135–146)
Total Bilirubin: 1.9 mg/dL — ABNORMAL HIGH (ref 0.2–1.2)
Total Protein: 7.4 g/dL (ref 6.1–8.1)

## 2016-08-05 LAB — CBC WITH DIFFERENTIAL/PLATELET
Basophils Absolute: 66 cells/uL (ref 0–200)
Basophils Relative: 1 %
Eosinophils Absolute: 198 cells/uL (ref 15–500)
Eosinophils Relative: 3 %
HCT: 46.3 % (ref 38.5–50.0)
Hemoglobin: 15.9 g/dL (ref 13.2–17.1)
Lymphocytes Relative: 23 %
Lymphs Abs: 1518 cells/uL (ref 850–3900)
MCH: 27 pg (ref 27.0–33.0)
MCHC: 34.3 g/dL (ref 32.0–36.0)
MCV: 78.7 fL — ABNORMAL LOW (ref 80.0–100.0)
MPV: 9.2 fL (ref 7.5–12.5)
Monocytes Absolute: 462 cells/uL (ref 200–950)
Monocytes Relative: 7 %
Neutro Abs: 4356 cells/uL (ref 1500–7800)
Neutrophils Relative %: 66 %
Platelets: 203 10*3/uL (ref 140–400)
RBC: 5.88 MIL/uL — ABNORMAL HIGH (ref 4.20–5.80)
RDW: 15 % (ref 11.0–15.0)
WBC: 6.6 10*3/uL (ref 3.8–10.8)

## 2016-08-05 LAB — POCT URINALYSIS DIPSTICK
Bilirubin, UA: NEGATIVE
Glucose, UA: NEGATIVE
KETONES UA: NEGATIVE
Leukocytes, UA: NEGATIVE
Nitrite, UA: NEGATIVE
PROTEIN UA: NEGATIVE
RBC UA: NEGATIVE
SPEC GRAV UA: 1.015
UROBILINOGEN UA: NEGATIVE
pH, UA: 6

## 2016-08-05 LAB — PSA: PSA: 1.3 ng/mL (ref <=4.0)

## 2016-08-07 NOTE — Progress Notes (Signed)
Subjective:    Patient ID: Douglas Waters, male    DOB: [birth year], 66 y.o.   MRN: [id number]  HPI 66 year old Male in today for health maintenance exam and evaluation of medical issues. He has a history of chronic back pain is seen at [facility] in [place]. He is on Duragesic patch 25 g per hour, hydrocodone /APAP 7.5/325 one or 2 tablets daily. He takes MiraLAX as needed for constipation. Just recently has had issues with diarrhea for a day or so and may have a viral gastroenteritis.  Notes from the pain clinic indicated he was scheduled for a right L3-S1 MBB May 03, 2016. He says he waited an hour in the lobby and 20 minutes in the procedure room and left without having the procedure done.  He had a respiratory infection in January that he says took some time to recover from. He is asymptomatic at the present time. Has had flu vaccine.  He has a history of inability to ejaculate while on Flomax for BPH. He discontinued that medication and the ejaculation disorder resolved. He doesn't feel he needs any medication for BPH symptoms. Doesn't have a lot of nocturia. Sometimes has some urge urinary incontinence.  Had colonoscopy in 2014. History of depression treated with Wellbutrin. Seems to be related to chronic back pain.  History of coronary artery disease, supraventricular tachycardia status post radiofrequency ablation. History of lumbar hemilaminectomy L5-S1. History of primary squamous cell carcinoma of the tonsil and is 7 years out from treatment. He's done well. He had radiation therapy and received cisplatin.  He's had reaction to tetanus toxoid immunization in the past and declines to take that.  History of chewing tobacco but never smoked. Does not consume alcohol.  History of cardiac catheterization 2004 showing a 25% proximal LAD stenosis. 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  He had lumbar  hemilaminectomy L5-S1 July 1992 by Dr. Quentin Cornwall. In June 1995, he had an MRI of the LS-spine showing some postoperative changes at L5-S1 with mild bulge, discogenic edema on the right at L5 and degenerative disc disease with a bulge at L4-L5 but no fracture or herniated disc. In September 1995 he had an MRI of the thoracic spine which was negative. Subsequently he was seen at pain management clinic in [place] where he had  epidural steroid injections. He had facet joint injections in January 1996. He saw Dr. Claiborne Billings in [facility] for chronic back pain in April 2000 and was diagnosed with a degenerative disc. He saw Dr. Vickey Sages at [facility] neurosurgical clinic in 1998 was given a similar diagnosis. He went to [facility] division of neurosurgery in 2001 where he was tried on Neurontin and trazodone. This did not help back pain. Spinal cord stimulator was discussed but patient declined. He then applied for disability because of chronic back issues.  Family history: Mother with history of MI and hypertension. Father with history of Alzheimer's disease and prostate cancer. 2 sisters in good health.       Review of Systems  HENT: Negative.   Respiratory: Negative.   Cardiovascular: Negative.   Musculoskeletal: Positive for back pain.  Neurological: Negative.        Objective:   Physical Exam  Constitutional: He is oriented to person, place, and time. He appears well-developed and well-nourished. No distress.  HENT:  Head: Normocephalic and atraumatic.  Right Ear: External ear normal.  Left Ear: External ear normal.  Mouth/Throat: Oropharynx is clear and moist. No oropharyngeal exudate.  Eyes: Conjunctivae and EOM are normal. Pupils are equal, round, and reactive to light. Right eye exhibits no discharge. Left eye exhibits no discharge. No scleral icterus.  Neck: No JVD present. No thyromegaly present.  No carotid bruits  Cardiovascular: Normal rate, regular rhythm, normal  heart sounds and intact distal pulses.   No murmur heard. Pulmonary/Chest: Effort normal and breath sounds normal. No respiratory distress. He has no wheezes. He has no rales.  Abdominal: Soft. Bowel sounds are normal. He exhibits no distension and no mass. There is no tenderness. There is no rebound and no guarding.  Genitourinary:  Genitourinary Comments: Prostate slightly enlarged without nodules  Musculoskeletal: He exhibits no edema.  Lymphadenopathy:    He has no cervical adenopathy.  Neurological: He is alert and oriented to person, place, and time. He has normal reflexes. No cranial nerve deficit. Coordination normal.  Skin: Skin is warm and dry. No rash noted. He is not diaphoretic.  Psychiatric: He has a normal mood and affect. His behavior is normal. Judgment and thought content normal.  Vitals reviewed.         Assessment & Plan:  Chronic back pain treated at [facility] in [place]  History of depression-treated with Wellbutrin  History of coronary artery disease  History of radiofrequency catheter ablation of AVNRT 2010  History of tonsil carcinoma  Benign prostatic hyperplasia-Flomax caused inability to ejaculate  Plan: Return in one year or as needed. Prescription given for pneumococcal 23. Prevnar given in 2017.  Subjective:   Patient presents for Medicare Annual/Subsequent preventive examination.  Review Past Medical/Family/Social:See above   Risk Factors  Current exercise habits: Sedentary due to back pain Dietary issues discussed: Low fat low carbohydrate   Cardiac risk factors:Family history in mother  Depression Screen  (Note: if answer to either of the following is "Yes", a more complete depression screening is indicated)   Over the past two weeks, have you felt down, depressed or hopeless? No  Over the past two weeks, have you felt little interest or pleasure in doing things? No Have you lost interest or pleasure in daily life?  No Do you often feel hopeless? No Do you cry easily over simple problems? No   Activities of Daily Living  In your present state of health, do you have any difficulty performing the following activities?:   Driving? No  Managing money? No  Feeding yourself? No  Getting from bed to chair? No  Climbing a flight of stairs? No  Preparing food and eating?: No  Bathing or showering? No  Getting dressed: No  Getting to the toilet? No  Using the toilet:No  Moving around from place to place: No  In the past year have you fallen or had a near fall?:No  Are you sexually active? yes Do you have more than one partner? No   Hearing Difficulties: No  Do you often ask people to speak up or repeat themselves? No  Do you experience ringing or noises in your ears? No  Do you have difficulty understanding soft or whispered voices? No  Do you feel that you have a problem with memory? No Do you often misplace items? No    Home Safety:  Do you have a smoke alarm at your residence? Yes Do you have grab bars in the bathroom? No Do you have throw rugs in your house? No   Cognitive Testing  Alert? Yes Normal Appearance?Yes  Oriented to person? Yes Place? Yes  Time? Yes  Recall of three objects? Yes  Can perform simple calculations? Yes  Displays appropriate judgment?Yes  Can read the correct time from a watch face?Yes   List the Names of Other Physician/Practitioners you currently use:  See referral list for the physicians patient is currently seeing.  Pain management clinic in [place]   Review of Systems: See above   Objective:     General appearance: Appears stated age and mildly obese  Head: Normocephalic, without obvious abnormality, atraumatic  Eyes: conj clear, EOMi PEERLA  Ears: normal TM's and external ear canals both ears  Nose: Nares normal. Septum midline. Mucosa normal. No drainage or sinus tenderness.  Throat: lips, mucosa, and tongue normal; teeth and gums normal   Neck: no adenopathy, no carotid bruit, no JVD, supple, symmetrical, trachea midline and thyroid not enlarged, symmetric, no tenderness/mass/nodules  No CVA tenderness.  Lungs: clear to auscultation bilaterally  Breasts: normal appearance, no masses or tenderness Heart: regular rate and rhythm, S1, S2 normal, no murmur, click, rub or gallop  Abdomen: soft, non-tender; bowel sounds normal; no masses, no organomegaly  Musculoskeletal: ROM normal in all joints, no crepitus, no deformity, Normal muscle strengthen. Back  is symmetric, no curvature. Skin: Skin color, texture, turgor normal. No rashes or lesions  Lymph nodes: Cervical, supraclavicular, and axillary nodes normal.  Neurologic: CN 2 -12 Normal, Normal symmetric reflexes. Normal coordination and gait  Psych: Alert & Oriented x 3, Mood appear stable.    Assessment:    Annual wellness medicare exam   Plan:    During the course of the visit the patient was educated and counseled about appropriate screening and preventive services including:   Annual flu vaccine  To get Prevnar 23 at pharmacy     Patient Instructions (the written plan) was given to the patient.  Medicare Attestation  I have personally reviewed:  The patient's medical and social history  Their use of alcohol, tobacco or illicit drugs  Their current medications and supplements  The patient's functional ability including ADLs,fall risks, home safety risks, cognitive, and hearing and visual impairment  Diet and physical activities  Evidence for depression or mood disorders  The patient's weight, height, BMI, and visual acuity have been recorded in the chart. I have made referrals, counseling, and provided education to the patient based on review of the above and I have provided the patient with a written personalized care plan for preventive services.

## 2016-08-07 NOTE — Patient Instructions (Signed)
Continue same medications and return in one year or as needed. Have Prevnar 23 at local pharmacy.

## 2016-08-13 DIAGNOSIS — E7800 Pure hypercholesterolemia, unspecified: Secondary | ICD-10-CM | POA: Insufficient documentation

## 2016-08-13 DIAGNOSIS — Z85819 Personal history of malignant neoplasm of unspecified site of lip, oral cavity, and pharynx: Secondary | ICD-10-CM | POA: Insufficient documentation

## 2017-02-08 ENCOUNTER — Ambulatory Visit (INDEPENDENT_AMBULATORY_CARE_PROVIDER_SITE_OTHER): Payer: Medicare Other | Admitting: Internal Medicine

## 2017-02-08 ENCOUNTER — Encounter: Payer: Self-pay | Admitting: Internal Medicine

## 2017-02-08 VITALS — BP 150/90 | HR 78 | Temp 98.2°F | Wt 225.0 lb

## 2017-02-08 DIAGNOSIS — R030 Elevated blood-pressure reading, without diagnosis of hypertension: Secondary | ICD-10-CM | POA: Diagnosis not present

## 2017-02-08 DIAGNOSIS — F419 Anxiety disorder, unspecified: Secondary | ICD-10-CM

## 2017-02-08 DIAGNOSIS — G44209 Tension-type headache, unspecified, not intractable: Secondary | ICD-10-CM

## 2017-02-08 DIAGNOSIS — F439 Reaction to severe stress, unspecified: Secondary | ICD-10-CM | POA: Diagnosis not present

## 2017-02-08 MED ORDER — CLONAZEPAM 0.5 MG PO TABS: 0.5000 mg | ORAL_TABLET | Freq: Two times a day (BID) | ORAL | 1 refills | Status: DC | PRN

## 2017-02-08 NOTE — Progress Notes (Deleted)
   Subjective:    Patient ID: Douglas Waters, male    DOB: 1950-07-02, 66 y.o.   MRN: [id number]  HPI    Review of Systems     Objective:   Physical Exam        Assessment & Plan:

## 2017-02-08 NOTE — Progress Notes (Signed)
   Subjective:    Patient ID: Douglas Waters, male    DOB: December 27, 1950, 66 y.o.   MRN: [id number]  HPI  History of chronic back pain seen at [facility] in [place] managed with Norco 7.5 mg/325 1 or 2 tablets daily and Fentanyl patch 25 g every 72 hours.  Recently has been dealing with  situation with his mother's house that was left to patient and his 2 sisters. Thinks he may have a buyer. He's been keeping up the house and showing the house but sisters would like to sell it as quickly as possible. Patient thinks he'll have it sold and maybe 3 months. Patient says this is causing anxiety and stress. His blood pressures been elevated as high as 168/98. He's complaining of pressure behind his eyes. His wife gave him some of her Klonopin and he seemed to get some relief in his blood pressure improved.  He has no vomiting or visual disturbance with this pressure behind his eyes. He has no prior history of headaches. He's been taking a baby aspirin when he gets the headache. No more than one a day however. Wife noted that Klonopin seem to help him sleep, improved his blood pressure and headache. He is not asking for any pain medication. He takes his pain medication appropriately.    Review of Systems see above     Objective:   Physical Exam Skin warm and dry. PERRLA. Extraocular movements are full. Cranial nerves II through XII are grossly intact. Muscle strength is normal in the upper and lower extremities. He is alert and oriented 3. Judgment and affect are appropriate. Does not seem to be in severe pain       Assessment & Plan:  Probable tension headache  Elevated blood pressure secondary to pain  Situational stress  Anxiety  Plan: Marya Amsler going to try Klonopin 0.5 mg 1/2-1 tablet up to twice daily as needed. Wife will call me with some blood pressure readings in a couple of weeks.

## 2017-02-08 NOTE — Patient Instructions (Signed)
Try Klonopin 0.5 mg 1/2-1 tablet up to twice daily as needed for anxiety and headache. Call with blood pressure readings in 2 weeks. Call sooner if worse.

## 2017-03-18 ENCOUNTER — Ambulatory Visit (INDEPENDENT_AMBULATORY_CARE_PROVIDER_SITE_OTHER): Payer: Medicare Other | Admitting: Internal Medicine

## 2017-03-18 DIAGNOSIS — Z23 Encounter for immunization: Secondary | ICD-10-CM

## 2017-03-18 NOTE — Progress Notes (Signed)
   Subjective:    Patient ID: Douglas Waters, male    DOB: 11-03-1950, 66 y.o.   MRN: [id number]  HPI Flu vaccine given    Review of Systems     Objective:   Physical Exam        Assessment & Plan:

## 2017-03-18 NOTE — Patient Instructions (Signed)
Flu vaccine given.

## 2017-04-11 ENCOUNTER — Encounter: Payer: Self-pay | Admitting: Internal Medicine

## 2017-04-11 ENCOUNTER — Ambulatory Visit
Admission: RE | Admit: 2017-04-11 | Discharge: 2017-04-11 | Disposition: A | Discharge status: Home / Self Care | Payer: Medicare Other | Source: Ambulatory Visit | Attending: Internal Medicine | Admitting: Internal Medicine

## 2017-04-11 ENCOUNTER — Ambulatory Visit (INDEPENDENT_AMBULATORY_CARE_PROVIDER_SITE_OTHER): Payer: Medicare Other | Admitting: Internal Medicine

## 2017-04-11 ENCOUNTER — Telehealth: Payer: Self-pay

## 2017-04-11 VITALS — BP 130/80 | HR 69 | Temp 98.0°F | Wt 221.0 lb

## 2017-04-11 DIAGNOSIS — J22 Unspecified acute lower respiratory infection: Secondary | ICD-10-CM | POA: Diagnosis not present

## 2017-04-11 DIAGNOSIS — R05 Cough: Secondary | ICD-10-CM | POA: Diagnosis not present

## 2017-04-11 DIAGNOSIS — R059 Cough, unspecified: Secondary | ICD-10-CM

## 2017-04-11 DIAGNOSIS — R911 Solitary pulmonary nodule: Secondary | ICD-10-CM

## 2017-04-11 MED ORDER — AZITHROMYCIN 250 MG PO TABS: ORAL_TABLET | ORAL | 0 refills | Status: DC

## 2017-04-11 MED ORDER — CEFTRIAXONE SODIUM 1 G IJ SOLR
1.0000 g | Freq: Once | INTRAMUSCULAR | Status: AC
  Administered 2017-04-11: 1 g via INTRAMUSCULAR

## 2017-04-11 NOTE — Telephone Encounter (Signed)
-----   Message from Douglas Showers, MD sent at 04/11/2017 12:56 PM EDT ----- He has no pneumonia but they think they see a small nodule in the left lung that may be new. They want him to have CT of chest. Tell him to wait until respiatory infection is gone and then go OK to put in order for this. Suggest waiting 2 weeks before he goes.

## 2017-04-11 NOTE — Patient Instructions (Addendum)
1 g IM Rocephin.  Zithromax Z-Pak take as directed.  Have chest x-ray.  Addendum: CT of the chest will be arranged in a couple of weeks.  Handicap parking permit signed

## 2017-04-11 NOTE — Progress Notes (Signed)
   Subjective:    Patient ID: Douglas Waters, male    DOB: 1950/09/16, 66 y.o.   MRN: [id number]  HPI 66 year old Male with one week history of URI symptoms.  Has thick white sputum production.  No documented fever or shaking chills.  Wheezing.    Review of Systems     Objective:   Physical Exam TMs are clear.  Pharynx is clear.  Neck is supple without significant adenopathy.  Chest bilateral rhonchi?  Rales       Assessment & Plan:  Acute bronchitis rule out pneumonia  Plan: Have chest x-ray.  Rocephin 1 g IM.  Zithromax Z-Pak take 2 tablets day 1 followed by 1 tablet days 2 through 5.  Handicap parking permit signed at his request.  Addendum: His CT shows?  Nodule left chest.  Since he has a history of oral cancer he is going to need to have a CT for further evaluation in a couple of weeks.  We will arrange that for him.

## 2017-04-21 ENCOUNTER — Ambulatory Visit
Admission: RE | Admit: 2017-04-21 | Discharge: 2017-04-21 | Disposition: A | Discharge status: Home / Self Care | Payer: Medicare Other | Source: Ambulatory Visit | Attending: Internal Medicine | Admitting: Internal Medicine

## 2017-04-21 ENCOUNTER — Telehealth: Payer: Self-pay

## 2017-04-21 DIAGNOSIS — R911 Solitary pulmonary nodule: Secondary | ICD-10-CM

## 2017-04-21 NOTE — Telephone Encounter (Signed)
Pt's wife is aware per DPR signed pt allows [place] to speak with her

## 2017-04-21 NOTE — Telephone Encounter (Signed)
-----   Message from Elby Showers, MD sent at 04/21/2017 11:52 AM EST ----- No evidence of any abnormality on chest CT. No nodule to be concerned about.

## 2017-05-26 ENCOUNTER — Other Ambulatory Visit: Payer: Self-pay | Admitting: Internal Medicine

## 2017-07-13 ENCOUNTER — Other Ambulatory Visit: Payer: Self-pay

## 2017-07-13 DIAGNOSIS — Z125 Encounter for screening for malignant neoplasm of prostate: Secondary | ICD-10-CM

## 2017-07-13 DIAGNOSIS — Z0000 Encounter for general adult medical examination without abnormal findings: Secondary | ICD-10-CM

## 2017-07-13 DIAGNOSIS — Z13220 Encounter for screening for lipoid disorders: Secondary | ICD-10-CM

## 2017-08-11 ENCOUNTER — Other Ambulatory Visit: Payer: Medicare Other | Admitting: Internal Medicine

## 2017-08-11 ENCOUNTER — Encounter: Payer: Self-pay | Admitting: Internal Medicine

## 2017-08-11 ENCOUNTER — Ambulatory Visit (INDEPENDENT_AMBULATORY_CARE_PROVIDER_SITE_OTHER): Payer: Medicare Other | Admitting: Internal Medicine

## 2017-08-11 VITALS — BP 120/78 | HR 76 | Ht 70.0 in | Wt 221.0 lb

## 2017-08-11 DIAGNOSIS — Z0000 Encounter for general adult medical examination without abnormal findings: Secondary | ICD-10-CM | POA: Diagnosis not present

## 2017-08-11 DIAGNOSIS — Z6831 Body mass index (BMI) 31.0-31.9, adult: Secondary | ICD-10-CM | POA: Diagnosis not present

## 2017-08-11 DIAGNOSIS — Z8659 Personal history of other mental and behavioral disorders: Secondary | ICD-10-CM

## 2017-08-11 DIAGNOSIS — N529 Male erectile dysfunction, unspecified: Secondary | ICD-10-CM

## 2017-08-11 DIAGNOSIS — R531 Weakness: Secondary | ICD-10-CM

## 2017-08-11 DIAGNOSIS — M961 Postlaminectomy syndrome, not elsewhere classified: Secondary | ICD-10-CM | POA: Diagnosis not present

## 2017-08-11 DIAGNOSIS — Z13220 Encounter for screening for lipoid disorders: Secondary | ICD-10-CM

## 2017-08-11 DIAGNOSIS — Z9889 Other specified postprocedural states: Secondary | ICD-10-CM | POA: Diagnosis not present

## 2017-08-11 DIAGNOSIS — J069 Acute upper respiratory infection, unspecified: Secondary | ICD-10-CM

## 2017-08-11 DIAGNOSIS — G8929 Other chronic pain: Secondary | ICD-10-CM | POA: Diagnosis not present

## 2017-08-11 DIAGNOSIS — M545 Low back pain: Secondary | ICD-10-CM | POA: Diagnosis not present

## 2017-08-11 DIAGNOSIS — N62 Hypertrophy of breast: Secondary | ICD-10-CM

## 2017-08-11 DIAGNOSIS — C099 Malignant neoplasm of tonsil, unspecified: Secondary | ICD-10-CM | POA: Diagnosis not present

## 2017-08-11 DIAGNOSIS — Z125 Encounter for screening for malignant neoplasm of prostate: Secondary | ICD-10-CM

## 2017-08-11 DIAGNOSIS — E7800 Pure hypercholesterolemia, unspecified: Secondary | ICD-10-CM

## 2017-08-11 DIAGNOSIS — F419 Anxiety disorder, unspecified: Secondary | ICD-10-CM | POA: Diagnosis not present

## 2017-08-11 LAB — POCT URINALYSIS DIPSTICK
Appearance: NORMAL
Bilirubin, UA: NEGATIVE
GLUCOSE UA: NEGATIVE
Ketones, UA: NEGATIVE
LEUKOCYTES UA: NEGATIVE
Nitrite, UA: NEGATIVE
Odor: NORMAL
Protein, UA: NEGATIVE
RBC UA: NEGATIVE
SPEC GRAV UA: 1.015 (ref 1.010–1.025)
UROBILINOGEN UA: 0.2 U/dL
pH, UA: 7 (ref 5.0–8.0)

## 2017-08-11 MED ORDER — AZITHROMYCIN 250 MG PO TABS: ORAL_TABLET | ORAL | 0 refills | Status: DC

## 2017-08-11 NOTE — Patient Instructions (Signed)
Viagra prescribed for impotence.  Testosterone level checked.  Prolactin checked for gynecomastia.  Zithromax Z-Pak for acute URI.

## 2017-08-11 NOTE — Addendum Note (Signed)
Addended by: Mady Haagensen on: 08/11/2017 10:58 AM   Modules accepted: Orders
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
## 2017-08-12 ENCOUNTER — Telehealth: Payer: Self-pay | Admitting: Internal Medicine

## 2017-08-12 DIAGNOSIS — R945 Abnormal results of liver function studies: Secondary | ICD-10-CM

## 2017-08-15 NOTE — Telephone Encounter (Signed)
Called his wife. Told her about elevated bilirubin and Ct scan

## 2017-08-16 LAB — CBC WITH DIFFERENTIAL/PLATELET
Basophils Absolute: 41 cells/uL (ref 0–200)
Basophils Relative: 0.8 %
EOS ABS: 230 {cells}/uL (ref 15–500)
Eosinophils Relative: 4.5 %
HCT: 42.9 % (ref 38.5–50.0)
Hemoglobin: 14.7 g/dL (ref 13.2–17.1)
Lymphs Abs: 597 cells/uL — ABNORMAL LOW (ref 850–3900)
MCH: 27.2 pg (ref 27.0–33.0)
MCHC: 34.3 g/dL (ref 32.0–36.0)
MCV: 79.3 fL — AB (ref 80.0–100.0)
MPV: 10 fL (ref 7.5–12.5)
Monocytes Relative: 14 %
NEUTROS PCT: 69 %
Neutro Abs: 3519 cells/uL (ref 1500–7800)
Platelets: 173 10*3/uL (ref 140–400)
RBC: 5.41 10*6/uL (ref 4.20–5.80)
RDW: 13.2 % (ref 11.0–15.0)
TOTAL LYMPHOCYTE: 11.7 %
WBC mixed population: 714 cells/uL (ref 200–950)
WBC: 5.1 10*3/uL (ref 3.8–10.8)

## 2017-08-16 LAB — COMPLETE METABOLIC PANEL WITH GFR
AG RATIO: 1.7 (calc) (ref 1.0–2.5)
ALKALINE PHOSPHATASE (APISO): 97 U/L (ref 40–115)
ALT: 14 U/L (ref 9–46)
AST: 23 U/L (ref 10–35)
Albumin: 4.3 g/dL (ref 3.6–5.1)
BILIRUBIN TOTAL: 2.9 mg/dL — AB (ref 0.2–1.2)
BUN: 17 mg/dL (ref 7–25)
CHLORIDE: 98 mmol/L (ref 98–110)
CO2: 30 mmol/L (ref 20–32)
Calcium: 9.8 mg/dL (ref 8.6–10.3)
Creat: 1.03 mg/dL (ref 0.70–1.25)
GFR, Est African American: 87 mL/min/{1.73_m2} (ref >=60)
GFR, Est Non African American: 75 mL/min/{1.73_m2} (ref >=60)
Globulin: 2.6 g/dL (calc) (ref 1.9–3.7)
Glucose, Bld: 88 mg/dL (ref 65–99)
Potassium: 4.5 mmol/L (ref 3.5–5.3)
Sodium: 135 mmol/L (ref 135–146)
Total Protein: 6.9 g/dL (ref 6.1–8.1)

## 2017-08-16 LAB — TESTOSTERONE: TESTOSTERONE: 103 ng/dL — AB (ref 250–827)

## 2017-08-16 LAB — PROLACTIN: Prolactin: 5.8 ng/mL (ref 2.0–18.0)

## 2017-08-16 LAB — LIPID PANEL
Cholesterol: 148 mg/dL (ref <200)
HDL: 39 mg/dL — ABNORMAL LOW (ref >40)
LDL CHOLESTEROL (CALC): 95 mg/dL
NON-HDL CHOLESTEROL (CALC): 109 mg/dL (ref <130)
Total CHOL/HDL Ratio: 3.8 (calc) (ref <5.0)
Triglycerides: 53 mg/dL (ref <150)

## 2017-08-16 LAB — PSA: PSA: 0.6 ng/mL (ref <=4.0)

## 2017-08-18 ENCOUNTER — Encounter: Payer: Self-pay | Admitting: Internal Medicine

## 2017-08-18 ENCOUNTER — Ambulatory Visit (INDEPENDENT_AMBULATORY_CARE_PROVIDER_SITE_OTHER): Payer: Medicare Other | Admitting: Internal Medicine

## 2017-08-18 VITALS — BP 110/80 | HR 80 | Temp 98.5°F | Ht 70.0 in | Wt 220.0 lb

## 2017-08-18 DIAGNOSIS — R0602 Shortness of breath: Secondary | ICD-10-CM | POA: Diagnosis not present

## 2017-08-18 DIAGNOSIS — R112 Nausea with vomiting, unspecified: Secondary | ICD-10-CM

## 2017-08-18 DIAGNOSIS — R110 Nausea: Secondary | ICD-10-CM

## 2017-08-18 DIAGNOSIS — R6883 Chills (without fever): Secondary | ICD-10-CM | POA: Diagnosis not present

## 2017-08-18 DIAGNOSIS — R718 Other abnormality of red blood cells: Secondary | ICD-10-CM | POA: Diagnosis not present

## 2017-08-18 LAB — POCT URINALYSIS DIPSTICK
APPEARANCE: NORMAL
Bilirubin, UA: NEGATIVE
GLUCOSE UA: NEGATIVE
Ketones, UA: NEGATIVE
Leukocytes, UA: NEGATIVE
NITRITE UA: NEGATIVE
Odor: NORMAL
RBC UA: NEGATIVE
SPEC GRAV UA: 1.02 (ref 1.010–1.025)
Urobilinogen, UA: 0.2 E.U./dL
pH, UA: 6 (ref 5.0–8.0)

## 2017-08-18 MED ORDER — METHYLPREDNISOLONE ACETATE 80 MG/ML IJ SUSP
80.0000 mg | Freq: Once | INTRAMUSCULAR | Status: AC
Start: 2017-08-18 — End: 2017-08-18
  Administered 2017-08-18: 80 mg via INTRAMUSCULAR

## 2017-08-18 NOTE — Progress Notes (Signed)
   Subjective:    Patient ID: Douglas Waters, male    DOB: Oct 08, 1950, 67 y.o.   MRN: [id number]  HPI  67 year old Male stopped using Duragesic transdermal patch for pain on  March 3.He was just seen  February 28 for routine health maintenance exam.   At the time, he was complaining of a respiratory infection and was placed on a Z-Pak.  Says he still has nasal stuffiness and congestion.  Says he has had chills and sweats. Was having SOB prior to taking patch off.  Says that several years ago he had some shortness of breath that he associated with a transdermal pain patch he was using.  He has no chest pain.  Today he has no shortness of breath and his pulse oximetry is normal. Last week had N and V for 2 days. Had Chest CT November which was OK. Was done because of abnl CXR. Took patch off on the 3rd of March. Not nauseated today. No SOB now.  He was found to have slightly elevated bilirubin without symptoms at time of physical exam and has been scheduled for abdominal CT with contrast.  This is to be done next week.   Review of Systems he sounds nasally congested and seems a bit anxious     Objective:   Physical Exam Skin warm and dry.  Nodes none.  TMs and pharynx are clear.  Neck is supple.  Chest completely clear to auscultation without rales, wheezing, or rhonchi.  I think some of his symptoms are due to withdrawal from       Assessment & Plan:   I think some of his symptoms are due to to withdrawal symptoms from stopping use of fentanyl/Duragesic patch.  He has been in touch with pain management  office in [place] and 8 suggested that as well.  I think he should go back on the patch.  I do not think it is causing him any problems.  He will have CT of the abdomen next week for evaluation of elevated bilirubin.  He has completed his Z-Pak but it stays in the system 10 days.  I gave him Depo-Medrol 80 mg IM today to help with nasal congestion.  I do not think he needs any further  treatment at this time.  He was reassured.  We did check some additional lab work today including CBC with differential, C met, BNP, amylase and lipase.  Amylase and lipase were checked because of complaint of nausea and vomiting recently and elevated bilirubin.  BNP was checked because of complaint of shortness of breath.  We did draw iron iron-binding capacity ferritin B12 and folate because of a low MCV.  25 minutes spent with patient.

## 2017-08-19 LAB — CBC WITH DIFFERENTIAL/PLATELET
BASOS PCT: 0.7 %
Basophils Absolute: 52 cells/uL (ref 0–200)
EOS ABS: 200 {cells}/uL (ref 15–500)
Eosinophils Relative: 2.7 %
HCT: 45.9 % (ref 38.5–50.0)
HEMOGLOBIN: 16.2 g/dL (ref 13.2–17.1)
Lymphs Abs: 888 cells/uL (ref 850–3900)
MCH: 27.5 pg (ref 27.0–33.0)
MCHC: 35.3 g/dL (ref 32.0–36.0)
MCV: 77.9 fL — AB (ref 80.0–100.0)
MPV: 9.9 fL (ref 7.5–12.5)
Monocytes Relative: 7.9 %
NEUTROS ABS: 5676 {cells}/uL (ref 1500–7800)
Neutrophils Relative %: 76.7 %
PLATELETS: 240 10*3/uL (ref 140–400)
RBC: 5.89 10*6/uL — AB (ref 4.20–5.80)
RDW: 13.1 % (ref 11.0–15.0)
TOTAL LYMPHOCYTE: 12 %
WBC: 7.4 10*3/uL (ref 3.8–10.8)
WBCMIX: 585 {cells}/uL (ref 200–950)

## 2017-08-19 LAB — COMPLETE METABOLIC PANEL WITH GFR
AG RATIO: 1.7 (calc) (ref 1.0–2.5)
ALBUMIN MSPROF: 4.5 g/dL (ref 3.6–5.1)
ALT: 15 U/L (ref 9–46)
AST: 17 U/L (ref 10–35)
Alkaline phosphatase (APISO): 99 U/L (ref 40–115)
BUN: 16 mg/dL (ref 7–25)
CO2: 26 mmol/L (ref 20–32)
Calcium: 10 mg/dL (ref 8.6–10.3)
Chloride: 103 mmol/L (ref 98–110)
Creat: 1.21 mg/dL (ref 0.70–1.25)
GFR, EST AFRICAN AMERICAN: 71 mL/min/{1.73_m2} (ref >=60)
GFR, EST NON AFRICAN AMERICAN: 62 mL/min/{1.73_m2} (ref >=60)
GLUCOSE: 104 mg/dL — AB (ref 65–99)
Globulin: 2.7 g/dL (calc) (ref 1.9–3.7)
Potassium: 4 mmol/L (ref 3.5–5.3)
Sodium: 139 mmol/L (ref 135–146)
TOTAL PROTEIN: 7.2 g/dL (ref 6.1–8.1)
Total Bilirubin: 2.1 mg/dL — ABNORMAL HIGH (ref 0.2–1.2)

## 2017-08-19 LAB — IRON,TIBC AND FERRITIN PANEL
%SAT: 29 % (calc) (ref 15–60)
Ferritin: 61 ng/mL (ref 20–380)
Iron: 98 ug/dL (ref 50–180)
TIBC: 339 ug/dL (ref 250–425)

## 2017-08-19 LAB — BRAIN NATRIURETIC PEPTIDE: Brain Natriuretic Peptide: 53 pg/mL (ref <100)

## 2017-08-19 LAB — VITAMIN B12: Vitamin B-12: 542 pg/mL (ref 200–1100)

## 2017-08-19 LAB — LIPASE: Lipase: 25 U/L (ref 7–60)

## 2017-08-19 LAB — AMYLASE: AMYLASE: 63 U/L (ref 21–101)

## 2017-08-19 LAB — FOLATE: Folate: 4.7 ng/mL — ABNORMAL LOW

## 2017-08-23 ENCOUNTER — Ambulatory Visit
Admission: RE | Admit: 2017-08-23 | Discharge: 2017-08-23 | Disposition: A | Discharge status: Home / Self Care | Payer: Medicare Other | Source: Ambulatory Visit | Attending: Internal Medicine | Admitting: Internal Medicine

## 2017-08-23 MED ORDER — IOPAMIDOL (ISOVUE-300) INJECTION 61%: 100.0000 mL | Freq: Once | INTRAVENOUS | Status: DC | PRN

## 2017-09-11 NOTE — Patient Instructions (Signed)
To have CT of abdomen to evaluate elevated bilirubin.  He should go back on Duragesic patch.  Depo-Medrol 80 mg IM given for nasal congestion.  Lab work drawn and pending.

## 2017-10-06 ENCOUNTER — Other Ambulatory Visit: Payer: Medicare Other | Admitting: Internal Medicine

## 2017-10-06 DIAGNOSIS — R17 Unspecified jaundice: Secondary | ICD-10-CM

## 2017-10-06 LAB — HEPATIC FUNCTION PANEL
AG Ratio: 1.7 (calc) (ref 1.0–2.5)
ALBUMIN MSPROF: 4.3 g/dL (ref 3.6–5.1)
ALKALINE PHOSPHATASE (APISO): 87 U/L (ref 40–115)
ALT: 13 U/L (ref 9–46)
AST: 18 U/L (ref 10–35)
BILIRUBIN TOTAL: 2.5 mg/dL — AB (ref 0.2–1.2)
Bilirubin, Direct: 0.4 mg/dL — ABNORMAL HIGH (ref 0.0–0.2)
Globulin: 2.6 g/dL (calc) (ref 1.9–3.7)
Indirect Bilirubin: 2.1 mg/dL (calc) — ABNORMAL HIGH (ref 0.2–1.2)
TOTAL PROTEIN: 6.9 g/dL (ref 6.1–8.1)

## 2017-10-07 ENCOUNTER — Telehealth: Payer: Self-pay | Admitting: Internal Medicine

## 2017-10-07 NOTE — Telephone Encounter (Signed)
Pam Schnapp Spouse [phone]  Pam called to inquire about the labs, she wanted to know the numbers and if they are high or low. I let her know that the results said elevated bilirubin. I had called earlier with the appointment for GI, and let her know they would be going over the results in more depth. She wants to get Dr Verlene Mayer opinion on this.

## 2017-10-07 NOTE — Telephone Encounter (Signed)
Left detailed message.   

## 2017-11-15 ENCOUNTER — Telehealth: Payer: Self-pay

## 2017-11-15 ENCOUNTER — Encounter: Payer: Self-pay | Admitting: Internal Medicine

## 2017-11-15 ENCOUNTER — Ambulatory Visit (INDEPENDENT_AMBULATORY_CARE_PROVIDER_SITE_OTHER): Payer: Medicare Other | Admitting: Internal Medicine

## 2017-11-15 VITALS — BP 130/80 | HR 67 | Temp 98.1°F | Ht 70.0 in | Wt 223.0 lb

## 2017-11-15 DIAGNOSIS — L309 Dermatitis, unspecified: Secondary | ICD-10-CM

## 2017-11-15 DIAGNOSIS — L299 Pruritus, unspecified: Secondary | ICD-10-CM

## 2017-11-15 NOTE — Telephone Encounter (Signed)
Patient's wife called would like to make an appt patient has broken out and has been itching for a week now. When would you like to work him in?

## 2017-11-15 NOTE — Patient Instructions (Signed)
Refer to dermatologist for further evaluation.  Appointment made for tomorrow.

## 2017-11-15 NOTE — Progress Notes (Signed)
   Subjective:    Patient ID: Douglas Waters, male    DOB: 02-21-1951, 67 y.o.   MRN: [id number]  HPI Apparently has been itching for several weeks.  He has irritation of his penis.  Says sex is painful.  Denies being anxious or worried about anything.  He has a testosterone preparation that he brings in today has been prescribed by urologist.  He thinks this is breaking him out although he only uses it on his chest.  He has a history of chronic back pain and goes to chronic pain medication clinic.  He is on Klonopin and Wellbutrin.  He is on a fentanyl patch.  He takes Norco 7.5/325 daily for chronic pain is 1 tab daily.    Review of Systems see above     Objective:   Physical Exam On the top of his foot he has a rectangular shaped area of erythema with petechiae.  His penis is clearly irritated and appears to be denuded.  He has a fine petechial rash of the upper abdomen.       Assessment & Plan:  Pruritus etiology unclear with tracheal type rash  Irritated penis-says that is itchy as well  Plan: He will see Dermatologist, Dr. Rozann Lesches tomorrow

## 2017-11-15 NOTE — Telephone Encounter (Signed)
Scheduled

## 2017-11-15 NOTE — Telephone Encounter (Signed)
Please make appt

## 2018-02-17 ENCOUNTER — Encounter: Payer: Self-pay | Admitting: Internal Medicine

## 2018-02-17 ENCOUNTER — Ambulatory Visit (INDEPENDENT_AMBULATORY_CARE_PROVIDER_SITE_OTHER): Payer: Medicare Other | Admitting: Internal Medicine

## 2018-02-17 VITALS — BP 140/80 | HR 66 | Temp 98.0°F | Ht 70.0 in | Wt 228.0 lb

## 2018-02-17 DIAGNOSIS — F430 Acute stress reaction: Secondary | ICD-10-CM

## 2018-02-17 DIAGNOSIS — R030 Elevated blood-pressure reading, without diagnosis of hypertension: Secondary | ICD-10-CM

## 2018-02-17 DIAGNOSIS — M545 Low back pain: Secondary | ICD-10-CM

## 2018-02-17 DIAGNOSIS — I499 Cardiac arrhythmia, unspecified: Secondary | ICD-10-CM

## 2018-02-17 LAB — COMPLETE METABOLIC PANEL WITH GFR
AG RATIO: 1.8 (calc) (ref 1.0–2.5)
ALKALINE PHOSPHATASE (APISO): 92 U/L (ref 40–115)
ALT: 11 U/L (ref 9–46)
AST: 14 U/L (ref 10–35)
Albumin: 4.4 g/dL (ref 3.6–5.1)
BUN: 13 mg/dL (ref 7–25)
CHLORIDE: 103 mmol/L (ref 98–110)
CO2: 28 mmol/L (ref 20–32)
Calcium: 9.9 mg/dL (ref 8.6–10.3)
Creat: 0.88 mg/dL (ref 0.70–1.25)
GFR, Est African American: 102 mL/min/{1.73_m2} (ref >=60)
GFR, Est Non African American: 88 mL/min/{1.73_m2} (ref >=60)
GLOBULIN: 2.5 g/dL (ref 1.9–3.7)
Glucose, Bld: 95 mg/dL (ref 65–99)
POTASSIUM: 4.6 mmol/L (ref 3.5–5.3)
SODIUM: 140 mmol/L (ref 135–146)
Total Bilirubin: 2.3 mg/dL — ABNORMAL HIGH (ref 0.2–1.2)
Total Protein: 6.9 g/dL (ref 6.1–8.1)

## 2018-02-17 LAB — CBC WITH DIFFERENTIAL/PLATELET
BASOS ABS: 27 {cells}/uL (ref 0–200)
Basophils Relative: 0.4 %
EOS PCT: 3.2 %
Eosinophils Absolute: 218 cells/uL (ref 15–500)
HEMATOCRIT: 43.4 % (ref 38.5–50.0)
Hemoglobin: 14.6 g/dL (ref 13.2–17.1)
Lymphs Abs: 952 cells/uL (ref 850–3900)
MCH: 27 pg (ref 27.0–33.0)
MCHC: 33.6 g/dL (ref 32.0–36.0)
MCV: 80.2 fL (ref 80.0–100.0)
MPV: 10.3 fL (ref 7.5–12.5)
Monocytes Relative: 7.6 %
NEUTROS PCT: 74.8 %
Neutro Abs: 5086 cells/uL (ref 1500–7800)
PLATELETS: 206 10*3/uL (ref 140–400)
RBC: 5.41 10*6/uL (ref 4.20–5.80)
RDW: 13 % (ref 11.0–15.0)
TOTAL LYMPHOCYTE: 14 %
WBC: 6.8 10*3/uL (ref 3.8–10.8)
WBCMIX: 517 {cells}/uL (ref 200–950)

## 2018-02-17 LAB — TSH: TSH: 1.95 m[IU]/L (ref 0.40–4.50)

## 2018-02-17 LAB — T4, FREE: FREE T4: 1 ng/dL (ref 0.8–1.8)

## 2018-02-17 MED ORDER — CLONAZEPAM 0.5 MG PO TABS: 0.5000 mg | ORAL_TABLET | Freq: Two times a day (BID) | ORAL | 0 refills | Status: DC | PRN
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
## 2018-02-17 NOTE — Patient Instructions (Addendum)
EKG shows PACs. He is to take Klonopin once or twice daily. Monitor BP. 24 hour Holter ordered for irrreg pulse. RTC 2 weeks. Labs drawn and pending.

## 2018-02-17 NOTE — Addendum Note (Signed)
Addended by: Mady Haagensen on: 02/17/2018 05:25 PM   Modules accepted: Orders

## 2018-02-23 ENCOUNTER — Ambulatory Visit (INDEPENDENT_AMBULATORY_CARE_PROVIDER_SITE_OTHER): Payer: Medicare Other

## 2018-02-23 DIAGNOSIS — R030 Elevated blood-pressure reading, without diagnosis of hypertension: Secondary | ICD-10-CM

## 2018-02-23 DIAGNOSIS — I499 Cardiac arrhythmia, unspecified: Secondary | ICD-10-CM | POA: Diagnosis not present

## 2018-03-03 ENCOUNTER — Ambulatory Visit: Payer: Medicare Other | Admitting: Internal Medicine

## 2018-03-03 ENCOUNTER — Encounter: Payer: Self-pay | Admitting: Internal Medicine

## 2018-03-03 ENCOUNTER — Ambulatory Visit (INDEPENDENT_AMBULATORY_CARE_PROVIDER_SITE_OTHER): Payer: Medicare Other | Admitting: Internal Medicine

## 2018-03-03 VITALS — BP 110/70 | HR 63 | Temp 98.3°F | Ht 70.0 in | Wt 229.0 lb

## 2018-03-03 DIAGNOSIS — Z23 Encounter for immunization: Secondary | ICD-10-CM | POA: Diagnosis not present

## 2018-03-03 DIAGNOSIS — I4891 Unspecified atrial fibrillation: Secondary | ICD-10-CM

## 2018-03-03 NOTE — Progress Notes (Signed)
   Subjective:    Patient ID: Douglas Waters, male    DOB: Jul 14, 1950, 67 y.o.   MRN: [id number]  HPI Here today for 24 hour Holter Monitor results. Has rate controlled A-fib and frequent PVCs. Some bradycardia at night.    His CHADS2 score is 1.   He has chronic back pain requring chronic pain medication for years at pain management clinic.  Is on Duragesic patch and Hydrocodone APAP 7.5 mg/325 mg/ daily.  Is on Wellbutrin and Klonopin for anxiety and depression related to chronic back pain.  He apparently spends a lot of time outside during the day according to wife.   Denies chest pain or SOB.    Review of Systems see above- I am not sure that recent BP elevation related to arrhythmia     Objective:   Physical Exam Today has irregular irregular rhythm. Rate is 63 and BP is normal at 110/70. Chest clear to auscultation.  No lower extremity edema.      Assessment & Plan:  Atrial fibrillation-rate controlled  Hx ablation by Dr. Lovena Le 2010 for easily inducible AVNRT  PVCs noted on Holter monitor  Elevated blood pressure- related to anxiety and situational stress- intolerant of many meds   CAD- history of 25% proximal LAD stenosis  Post laminectomy syndrome requiring chronic pain meds- see dictation 01/26/11 for details of back issues  Elevated bilirubin- seen this Spring by Dr. 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  Hx squamous cell carcinoma left tonsil- treated by Oncology with cisplatin and Radiation Oncology  cured  Plan: Pt to be referred back to Dr. Lovena Le for evaluation. Needs free T4 and TSH checked. PT INR is normal. CBC is normal. 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 Cristina Gong this past Spring.

## 2018-03-04 LAB — CBC WITH DIFFERENTIAL/PLATELET
BASOS ABS: 41 {cells}/uL (ref 0–200)
Basophils Relative: 0.6 %
EOS ABS: 221 {cells}/uL (ref 15–500)
Eosinophils Relative: 3.2 %
HEMATOCRIT: 45.9 % (ref 38.5–50.0)
HEMOGLOBIN: 15.9 g/dL (ref 13.2–17.1)
LYMPHS ABS: 1180 {cells}/uL (ref 850–3900)
MCH: 27.3 pg (ref 27.0–33.0)
MCHC: 34.6 g/dL (ref 32.0–36.0)
MCV: 78.7 fL — ABNORMAL LOW (ref 80.0–100.0)
MPV: 10.1 fL (ref 7.5–12.5)
Monocytes Relative: 8.4 %
NEUTROS ABS: 4878 {cells}/uL (ref 1500–7800)
Neutrophils Relative %: 70.7 %
Platelets: 212 10*3/uL (ref 140–400)
RBC: 5.83 10*6/uL — ABNORMAL HIGH (ref 4.20–5.80)
RDW: 13.4 % (ref 11.0–15.0)
Total Lymphocyte: 17.1 %
WBC mixed population: 580 cells/uL (ref 200–950)
WBC: 6.9 10*3/uL (ref 3.8–10.8)

## 2018-03-04 LAB — PROTIME-INR
INR: 1
PROTHROMBIN TIME: 10.5 s (ref 9.0–11.5)

## 2018-03-04 NOTE — Patient Instructions (Signed)
To see Dr. Lovena Le regarding A-fib. Flu vaccine given

## 2018-03-09 ENCOUNTER — Encounter: Payer: Self-pay | Admitting: Internal Medicine

## 2018-03-09 ENCOUNTER — Ambulatory Visit (INDEPENDENT_AMBULATORY_CARE_PROVIDER_SITE_OTHER): Payer: Medicare Other | Admitting: Internal Medicine

## 2018-03-09 VITALS — BP 146/82 | HR 52 | Ht 73.0 in | Wt 230.2 lb

## 2018-03-09 DIAGNOSIS — I480 Paroxysmal atrial fibrillation: Secondary | ICD-10-CM | POA: Diagnosis not present

## 2018-03-09 NOTE — Patient Instructions (Addendum)
Medication Instructions:  Your physician recommends that you continue on your current medications as directed. Please refer to the Current Medication list given to you today.  1.  Take your blood pressure daily and record in a log for the next 1-2 weeks.  Bring log back to office and leave for [facility].   Labwork: None ordered.  Testing/Procedures: None ordered.  Follow-Up: Your physician wants you to follow-up in: 3 months with Dr. Lovena Le.       Any Other Special Instructions Will Be Listed Below (If Applicable).  If you need a refill on your cardiac medications before your next appointment, please call your pharmacy.
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
## 2018-03-14 ENCOUNTER — Ambulatory Visit: Payer: Medicare Other | Admitting: Internal Medicine

## 2018-03-20 ENCOUNTER — Telehealth: Payer: Self-pay | Admitting: Internal Medicine

## 2018-03-20 NOTE — Telephone Encounter (Signed)
Walk In pt Form-BP Readings dropped off. Placed in Dr.Taylor doc box.

## 2018-03-21 IMAGING — CT CT CHEST W/O CM
1 series · 15 of 34 positions shown, 19 images · non-contrast
Comparison: 04/11/2017 chest radiograph and prior studies.

CLINICAL DATA: 66-year-old male for evaluation of nodular opacity
overlying the left upper lung on recent chest radiograph.

EXAM:
CT CHEST WITHOUT CONTRAST
TECHNIQUE: Multidetector CT imaging of the chest was performed following the
standard protocol without IV contrast.

[Series 2: chest w/(date) · axial · 0.76mm/px · z∈[-90,+158]mm · 15 of 146 slices shown, 19 images]
[im 11/146  mediastinal]
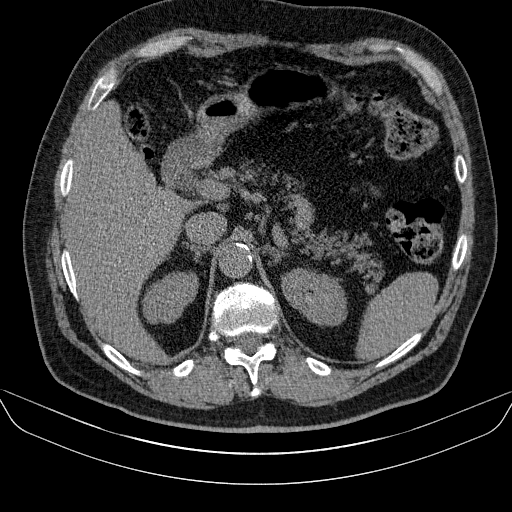
[im 11/146  lung]
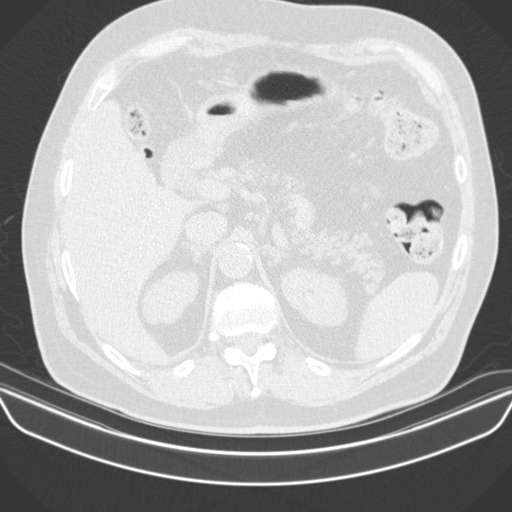
[im 22/146  lung]
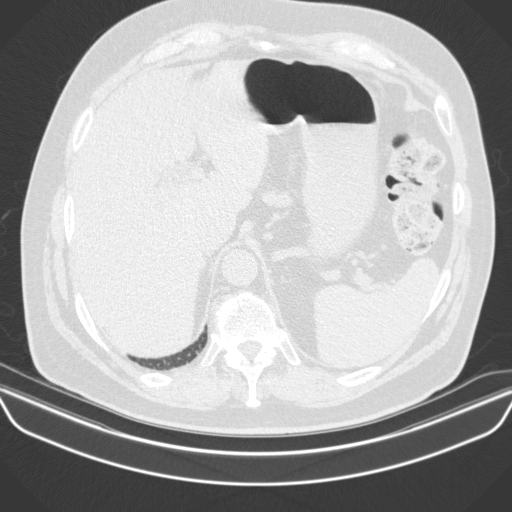
[im 30/146  lung]
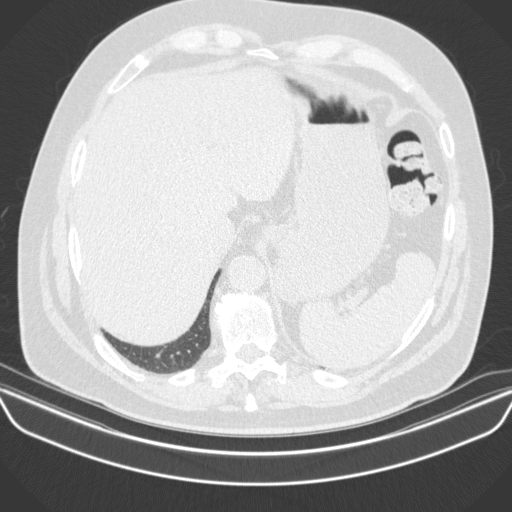
[im 38/146  lung]
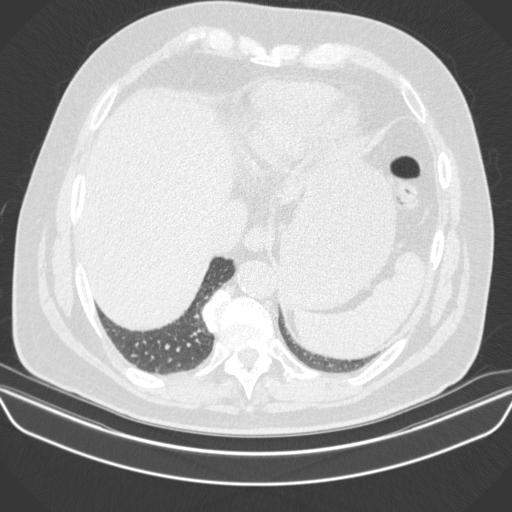
[im 49/146  mediastinal]
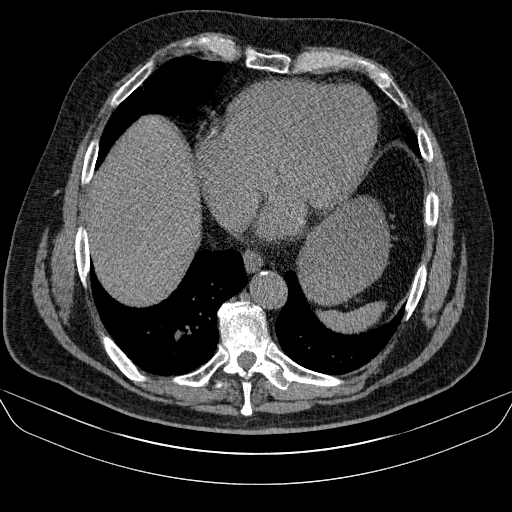
[im 49/146  lung]
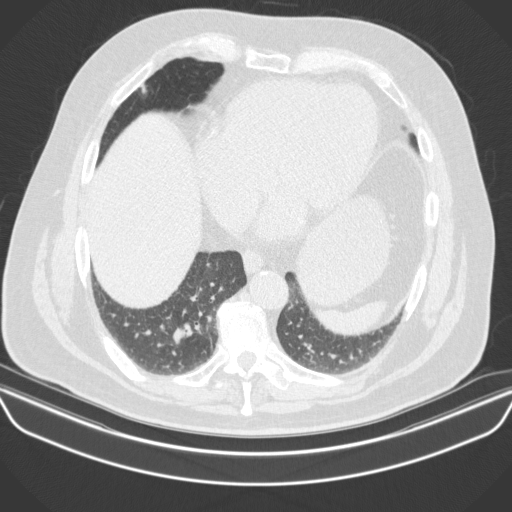
[im 59/146  lung]
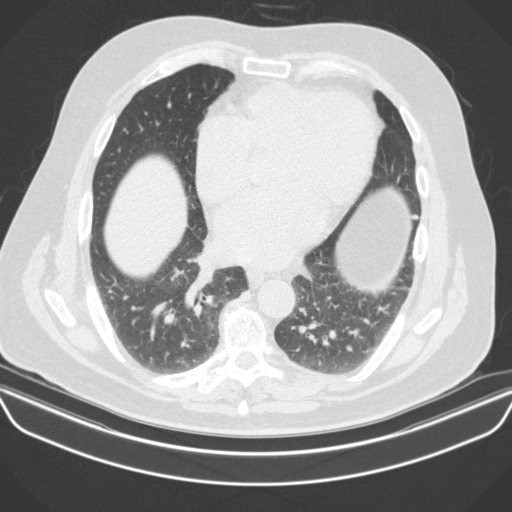
[im 65/146  lung]
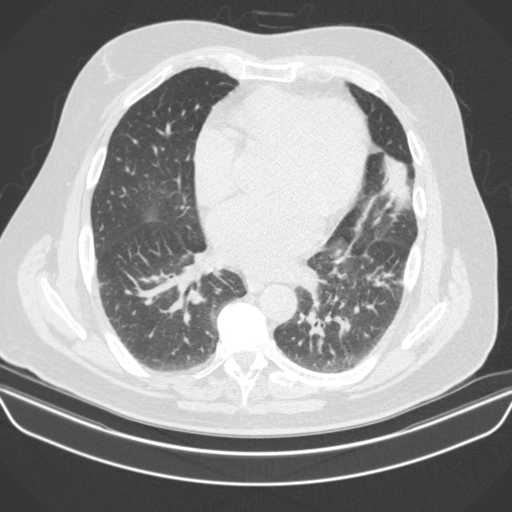
[im 76/146  lung]
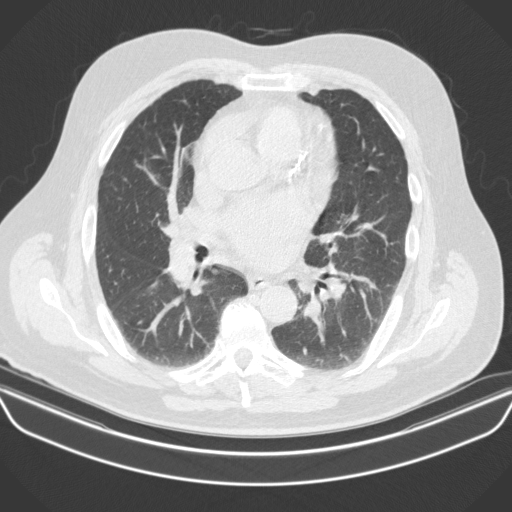
[im 81/146  mediastinal]
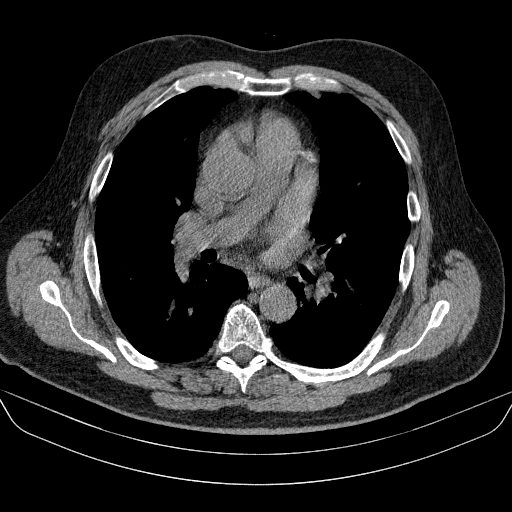
[im 81/146  lung]
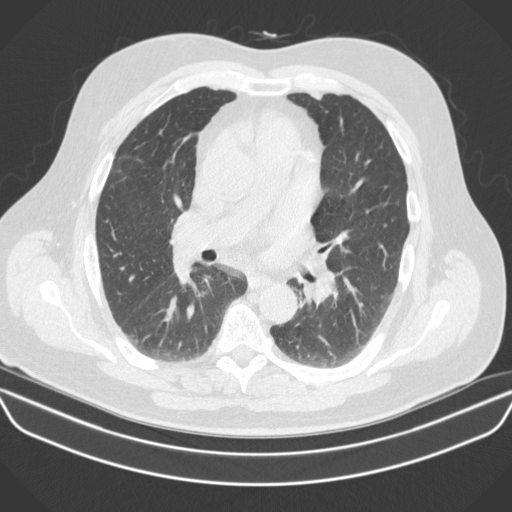
[im 88/146  lung]
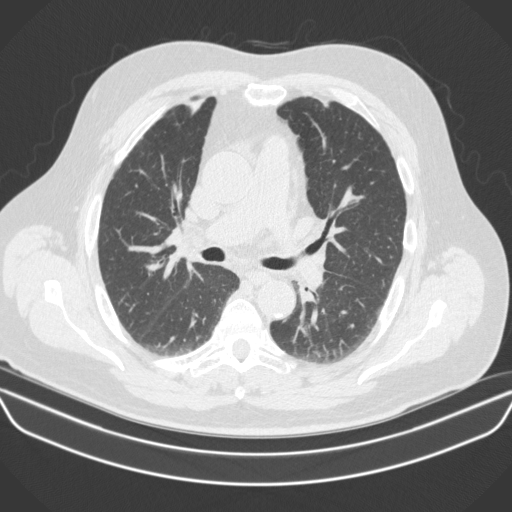
[im 97/146  lung]
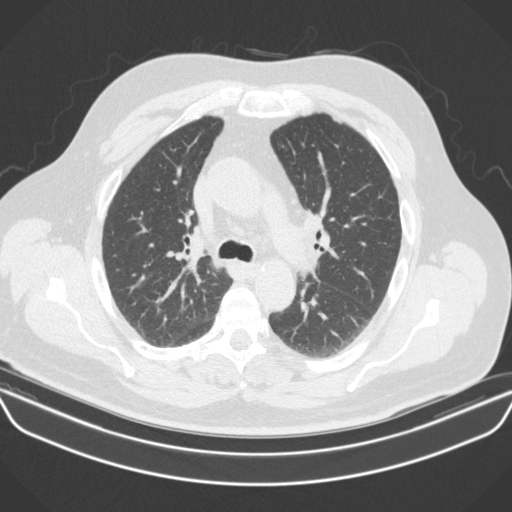
[im 108/146  lung]
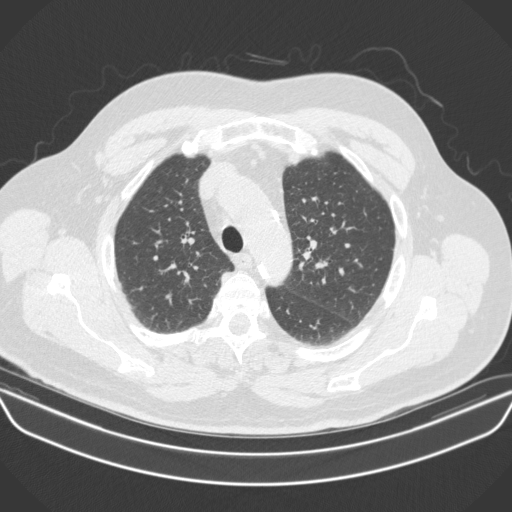
[im 117/146  mediastinal]
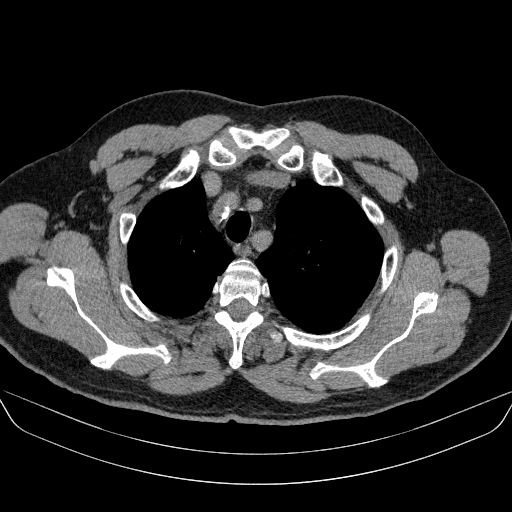
[im 117/146  lung]
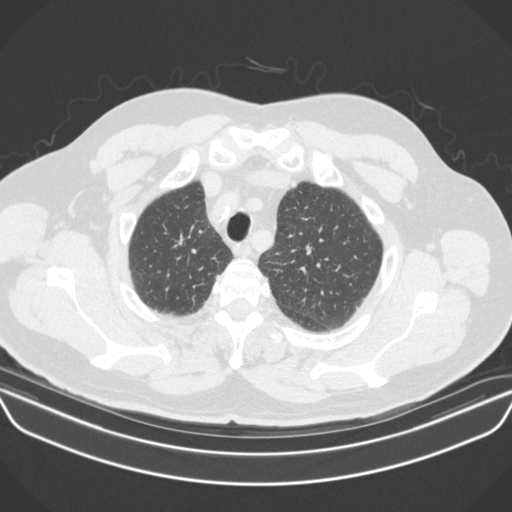
[im 124/146  lung]
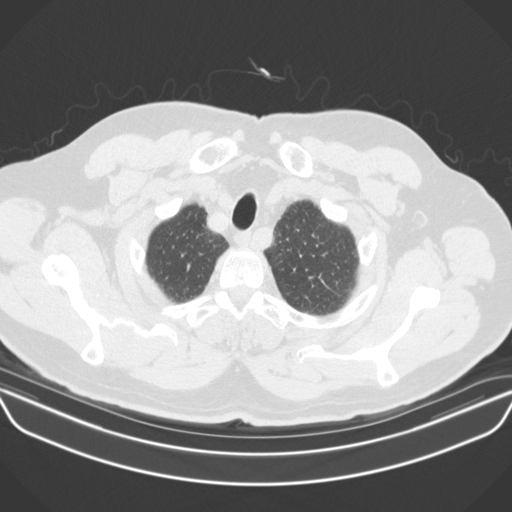
[im 135/146  lung]
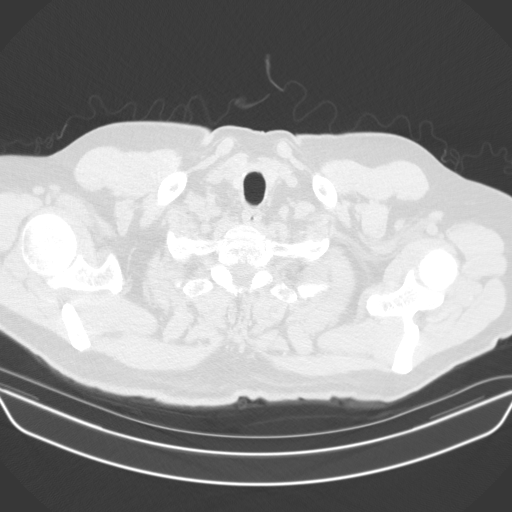

[15 of 34 positions shown; findings below may reference images not displayed]

FINDINGS: Cardiovascular: Cardiomegaly and coronary artery disease noted.
Thoracic aortic atherosclerotic calcifications noted without
aneurysm. No pericardial effusion.

Mediastinum/Nodes: No enlarged mediastinal or axillary lymph nodes.
Thyroid gland, trachea, and esophagus demonstrate no significant
findings.

Lungs/Pleura: Subsegmental atelectasis identified within both lower
lobes and right upper lobe. There is no evidence of suspicious
nodule, mass, consolidation, airspace disease, pleural effusion or
pneumothorax. Mild peribronchial thickening is again noted.

Upper Abdomen: No acute or suspicious abnormality. Status post
cholecystectomy.

Musculoskeletal: No chest wall mass or suspicious bone lesions
identified.
IMPRESSION: 1. No evidence of pulmonary nodule or abnormality in the area of the
chest x-ray abnormality.
2. Scattered areas of subsegmental atelectasis within both lungs.
3. Cardiomegaly and coronary artery disease
4.  Aortic Atherosclerosis ([id number].P).

## 2018-03-22 ENCOUNTER — Telehealth: Payer: Self-pay | Admitting: Internal Medicine

## 2018-03-22 NOTE — Telephone Encounter (Signed)
Call returned to wife.  Advised per Dr. Lovena Le Pt does have hypertension and should start Xarelto 20 mg PO daily.  All questions answered.  Family to pick up Xarelto samples at office tomorrow 03/23/2018.

## 2018-03-22 NOTE — Telephone Encounter (Signed)
New Message         Patient's wife is calling to see what was decide for her husband. If they are going to put him on blood thinners or not. Pls call and advise.

## 2018-03-23 MED ORDER — RIVAROXABAN 20 MG PO TABS: 20.0000 mg | ORAL_TABLET | Freq: Every day | ORAL | 11 refills | Status: DC

## 2018-03-23 NOTE — Telephone Encounter (Signed)
Samples placed at front desk for pick up.  BP readings: 164/94, 152/111, 137/89, 144/76, 151/88, 133/71, 146/90, 140/79

## 2018-03-23 NOTE — Addendum Note (Signed)
Addended by: Willeen Cass A on: 03/23/2018 07:19 AM   Modules accepted: Orders

## 2018-04-07 ENCOUNTER — Telehealth: Payer: Self-pay | Admitting: Internal Medicine

## 2018-04-07 DIAGNOSIS — I10 Essential (primary) hypertension: Secondary | ICD-10-CM

## 2018-04-07 NOTE — Telephone Encounter (Signed)
Returned call to wife.  Advised I would ask Dr. Lovena Le about Pt's elevated blood pressure.  Did discuss that appears Pt runs a low heart rate, which could make treating elevated blood pressure difficult.  Per wife, Pt has felt like he has less energy since he has been in afib.  Advised if Pt continued to feel bad call office to move up follow up appt.

## 2018-04-07 NOTE — Telephone Encounter (Signed)
New Message:     Pt c/o medication issue:  1. Name of Medication: Eliquis  2. How are you currently taking this medication (dosage and times per day)?  3. Are you having a reaction (difficulty breathing--STAT)? No  4. What is your medication issue? Pt states the pt has been having trouble with his blood pressure but is not on any blood thinners/ She states she would like know if he needs to be on something since his BP is fluctuating.   Pt c/o BP issue: STAT if pt c/o blurred vision, one-sided weakness or slurred speech  1. What are your last 5 BP readings? 150/90, 143/93, 152/83  2. Are you having any other symptoms (ex. Dizziness, headache, blurred vision, passed out)?   3. What is your BP issue? Pt states it is fluctuating and needs to know is it the blood thinner.

## 2018-04-13 MED ORDER — AMLODIPINE BESYLATE 5 MG PO TABS: 5.0000 mg | ORAL_TABLET | Freq: Every day | ORAL | 11 refills | Status: DC

## 2018-04-13 NOTE — Telephone Encounter (Signed)
Attempted outreach to Pt.  Call was disconnected.  Per Dr. Lovena Le have Pt try amlodipine 5 mg daily for elevated blood pressure.  Will send medication to pharmacy and make another attempt to call Pt.

## 2018-04-14 NOTE — Telephone Encounter (Signed)
Spoke with wife.  Discussed new blood pressure medication prescribed by Dr. Lovena Le.  Per wife, Pt having increased sob.  Possibly d/t afib.  Will move f/u appt up.  No further action at this time.

## 2018-05-05 ENCOUNTER — Encounter: Payer: Self-pay | Admitting: Internal Medicine

## 2018-05-05 ENCOUNTER — Ambulatory Visit (INDEPENDENT_AMBULATORY_CARE_PROVIDER_SITE_OTHER): Payer: Medicare Other | Admitting: Internal Medicine

## 2018-05-05 VITALS — BP 124/66 | HR 60 | Ht 73.0 in | Wt 226.0 lb

## 2018-05-05 DIAGNOSIS — I493 Ventricular premature depolarization: Secondary | ICD-10-CM | POA: Diagnosis not present

## 2018-05-05 DIAGNOSIS — I480 Paroxysmal atrial fibrillation: Secondary | ICD-10-CM

## 2018-05-05 DIAGNOSIS — I10 Essential (primary) hypertension: Secondary | ICD-10-CM | POA: Diagnosis not present

## 2018-05-05 NOTE — Patient Instructions (Addendum)
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
## 2018-06-01 ENCOUNTER — Other Ambulatory Visit: Payer: Self-pay | Admitting: Internal Medicine

## 2018-06-12 ENCOUNTER — Ambulatory Visit: Payer: Medicare Other | Admitting: Internal Medicine

## 2018-08-14 ENCOUNTER — Ambulatory Visit (INDEPENDENT_AMBULATORY_CARE_PROVIDER_SITE_OTHER): Payer: Medicare Other | Admitting: Internal Medicine

## 2018-08-14 ENCOUNTER — Encounter: Payer: Self-pay | Admitting: Internal Medicine

## 2018-08-14 VITALS — BP 110/80 | HR 78 | Ht 73.0 in | Wt 237.0 lb

## 2018-08-14 DIAGNOSIS — F419 Anxiety disorder, unspecified: Secondary | ICD-10-CM

## 2018-08-14 DIAGNOSIS — Z7901 Long term (current) use of anticoagulants: Secondary | ICD-10-CM

## 2018-08-14 DIAGNOSIS — N529 Male erectile dysfunction, unspecified: Secondary | ICD-10-CM | POA: Diagnosis not present

## 2018-08-14 DIAGNOSIS — M545 Low back pain, unspecified: Secondary | ICD-10-CM

## 2018-08-14 DIAGNOSIS — Z125 Encounter for screening for malignant neoplasm of prostate: Secondary | ICD-10-CM

## 2018-08-14 DIAGNOSIS — G8929 Other chronic pain: Secondary | ICD-10-CM

## 2018-08-14 DIAGNOSIS — C099 Malignant neoplasm of tonsil, unspecified: Secondary | ICD-10-CM

## 2018-08-14 DIAGNOSIS — Z9889 Other specified postprocedural states: Secondary | ICD-10-CM

## 2018-08-14 DIAGNOSIS — Z0000 Encounter for general adult medical examination without abnormal findings: Secondary | ICD-10-CM

## 2018-08-14 DIAGNOSIS — R7989 Other specified abnormal findings of blood chemistry: Secondary | ICD-10-CM

## 2018-08-14 DIAGNOSIS — Z8659 Personal history of other mental and behavioral disorders: Secondary | ICD-10-CM

## 2018-08-14 DIAGNOSIS — I480 Paroxysmal atrial fibrillation: Secondary | ICD-10-CM

## 2018-08-14 DIAGNOSIS — M961 Postlaminectomy syndrome, not elsewhere classified: Secondary | ICD-10-CM

## 2018-08-14 DIAGNOSIS — N62 Hypertrophy of breast: Secondary | ICD-10-CM

## 2018-08-14 LAB — POCT URINALYSIS DIPSTICK
APPEARANCE: NORMAL
GLUCOSE UA: NEGATIVE
Ketones, UA: NEGATIVE
LEUKOCYTES UA: NEGATIVE
Nitrite, UA: NEGATIVE
Odor: NORMAL
PH UA: 6.5 (ref 5.0–8.0)
Protein, UA: NEGATIVE
RBC UA: NEGATIVE
SPEC GRAV UA: 1.015 (ref 1.010–1.025)
UROBILINOGEN UA: 0.2 U/dL

## 2018-08-15 LAB — CBC WITH DIFFERENTIAL/PLATELET
Absolute Monocytes: 512 cells/uL (ref 200–950)
Basophils Absolute: 51 cells/uL (ref 0–200)
Basophils Relative: 0.8 %
EOS PCT: 3.8 %
Eosinophils Absolute: 243 cells/uL (ref 15–500)
HEMATOCRIT: 45.8 % (ref 38.5–50.0)
HEMOGLOBIN: 15.7 g/dL (ref 13.2–17.1)
LYMPHS ABS: 1037 {cells}/uL (ref 850–3900)
MCH: 27.2 pg (ref 27.0–33.0)
MCHC: 34.3 g/dL (ref 32.0–36.0)
MCV: 79.4 fL — ABNORMAL LOW (ref 80.0–100.0)
MPV: 9.7 fL (ref 7.5–12.5)
Monocytes Relative: 8 %
NEUTROS ABS: 4557 {cells}/uL (ref 1500–7800)
Neutrophils Relative %: 71.2 %
Platelets: 226 10*3/uL (ref 140–400)
RBC: 5.77 10*6/uL (ref 4.20–5.80)
RDW: 13.2 % (ref 11.0–15.0)
Total Lymphocyte: 16.2 %
WBC: 6.4 10*3/uL (ref 3.8–10.8)

## 2018-08-15 LAB — COMPLETE METABOLIC PANEL WITH GFR
AG Ratio: 1.5 (calc) (ref 1.0–2.5)
ALT: 15 U/L (ref 9–46)
AST: 20 U/L (ref 10–35)
Albumin: 4.4 g/dL (ref 3.6–5.1)
Alkaline phosphatase (APISO): 88 U/L (ref 35–144)
BUN: 14 mg/dL (ref 7–25)
CALCIUM: 10.5 mg/dL — AB (ref 8.6–10.3)
CO2: 29 mmol/L (ref 20–32)
CREATININE: 1.04 mg/dL (ref 0.70–1.25)
Chloride: 98 mmol/L (ref 98–110)
GFR, EST NON AFRICAN AMERICAN: 73 mL/min/{1.73_m2} (ref >=60)
GFR, Est African American: 85 mL/min/{1.73_m2} (ref >=60)
GLOBULIN: 3 g/dL (ref 1.9–3.7)
GLUCOSE: 95 mg/dL (ref 65–99)
Potassium: 5.2 mmol/L (ref 3.5–5.3)
Sodium: 138 mmol/L (ref 135–146)
Total Bilirubin: 2 mg/dL — ABNORMAL HIGH (ref 0.2–1.2)
Total Protein: 7.4 g/dL (ref 6.1–8.1)

## 2018-08-15 LAB — LIPID PANEL
CHOL/HDL RATIO: 4 (calc) (ref <5.0)
CHOLESTEROL: 173 mg/dL (ref <200)
HDL: 43 mg/dL (ref >=40)
LDL Cholesterol (Calc): 114 mg/dL (calc) — ABNORMAL HIGH
NON-HDL CHOLESTEROL (CALC): 130 mg/dL — AB (ref <130)
TRIGLYCERIDES: 66 mg/dL (ref <150)

## 2018-08-15 LAB — PSA: PSA: 0.9 ng/mL (ref <=4.0)

## 2018-09-09 NOTE — Patient Instructions (Signed)
Was a pleasure to see you today.  Continue current medications and return in 1 year or as needed.  Please watch weight and try to get some exercise with walking.
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
## 2019-04-27 ENCOUNTER — Encounter (HOSPITAL_COMMUNITY): Payer: Self-pay

## 2019-04-27 ENCOUNTER — Encounter: Payer: Self-pay | Admitting: Emergency Medicine

## 2019-04-27 ENCOUNTER — Other Ambulatory Visit: Payer: Self-pay

## 2019-04-27 ENCOUNTER — Emergency Department (HOSPITAL_COMMUNITY): Payer: Medicare Other

## 2019-04-27 ENCOUNTER — Emergency Department (HOSPITAL_COMMUNITY)
Admission: EM | Admit: 2019-04-27 | Discharge: 2019-04-27 | Disposition: A | Discharge status: Home / Self Care | Payer: Medicare Other | Attending: Emergency Medicine | Admitting: Emergency Medicine

## 2019-04-27 ENCOUNTER — Emergency Department (INDEPENDENT_AMBULATORY_CARE_PROVIDER_SITE_OTHER)
Admission: EM | Admit: 2019-04-27 | Discharge: 2019-04-27 | Disposition: A | Discharge status: Nursing Home (Includes ICF) | Payer: Medicare Other | Source: Home / Self Care

## 2019-04-27 DIAGNOSIS — R112 Nausea with vomiting, unspecified: Secondary | ICD-10-CM

## 2019-04-27 DIAGNOSIS — U071 COVID-19: Secondary | ICD-10-CM

## 2019-04-27 DIAGNOSIS — I2510 Atherosclerotic heart disease of native coronary artery without angina pectoris: Secondary | ICD-10-CM | POA: Diagnosis not present

## 2019-04-27 DIAGNOSIS — I951 Orthostatic hypotension: Secondary | ICD-10-CM

## 2019-04-27 DIAGNOSIS — Z79899 Other long term (current) drug therapy: Secondary | ICD-10-CM | POA: Insufficient documentation

## 2019-04-27 DIAGNOSIS — F17220 Nicotine dependence, chewing tobacco, uncomplicated: Secondary | ICD-10-CM | POA: Insufficient documentation

## 2019-04-27 DIAGNOSIS — Z7901 Long term (current) use of anticoagulants: Secondary | ICD-10-CM | POA: Diagnosis not present

## 2019-04-27 DIAGNOSIS — R6889 Other general symptoms and signs: Secondary | ICD-10-CM

## 2019-04-27 DIAGNOSIS — Z8589 Personal history of malignant neoplasm of other organs and systems: Secondary | ICD-10-CM | POA: Insufficient documentation

## 2019-04-27 DIAGNOSIS — R531 Weakness: Secondary | ICD-10-CM | POA: Diagnosis present

## 2019-04-27 LAB — COMPREHENSIVE METABOLIC PANEL
ALT: 27 U/L (ref 0–44)
AST: 38 U/L (ref 15–41)
Albumin: 4.4 g/dL (ref 3.5–5.0)
Alkaline Phosphatase: 93 U/L (ref 38–126)
Anion gap: 14 (ref 5–15)
BUN: 22 mg/dL (ref 8–23)
CO2: 26 mmol/L (ref 22–32)
Calcium: 9.7 mg/dL (ref 8.9–10.3)
Chloride: 95 mmol/L — ABNORMAL LOW (ref 98–111)
Creatinine, Ser: 1.38 mg/dL — ABNORMAL HIGH (ref 0.61–1.24)
GFR calc Af Amer: >60 mL/min (ref >60)
GFR calc non Af Amer: 52 mL/min — ABNORMAL LOW (ref >60)
Glucose, Bld: 120 mg/dL — ABNORMAL HIGH (ref 70–99)
Potassium: 3.5 mmol/L (ref 3.5–5.1)
Sodium: 135 mmol/L (ref 135–145)
Total Bilirubin: 2 mg/dL — ABNORMAL HIGH (ref 0.3–1.2)
Total Protein: 8 g/dL (ref 6.5–8.1)

## 2019-04-27 LAB — CBC WITH DIFFERENTIAL/PLATELET
Abs Immature Granulocytes: 0.02 10*3/uL (ref 0.00–0.07)
Basophils Absolute: 0 10*3/uL (ref 0.0–0.1)
Basophils Relative: 0 %
Eosinophils Absolute: 0 10*3/uL (ref 0.0–0.5)
Eosinophils Relative: 0 %
HCT: 55.2 % — ABNORMAL HIGH (ref 39.0–52.0)
Hemoglobin: 17.9 g/dL — ABNORMAL HIGH (ref 13.0–17.0)
Immature Granulocytes: 0 %
Lymphocytes Relative: 16 %
Lymphs Abs: 0.9 10*3/uL (ref 0.7–4.0)
MCH: 26.7 pg (ref 26.0–34.0)
MCHC: 32.4 g/dL (ref 30.0–36.0)
MCV: 82.4 fL (ref 80.0–100.0)
Monocytes Absolute: 0.6 10*3/uL (ref 0.1–1.0)
Monocytes Relative: 11 %
Neutro Abs: 4 10*3/uL (ref 1.7–7.7)
Neutrophils Relative %: 73 %
Platelets: 180 10*3/uL (ref 150–400)
RBC: 6.7 MIL/uL — ABNORMAL HIGH (ref 4.22–5.81)
RDW: 14.6 % (ref 11.5–15.5)
WBC: 5.5 10*3/uL (ref 4.0–10.5)
nRBC: 0 % (ref 0.0–0.2)

## 2019-04-27 LAB — POC SARS CORONAVIRUS 2 AG -  ED: SARS Coronavirus 2 Ag: POSITIVE

## 2019-04-27 LAB — POCT INFLUENZA A/B
Influenza A, POC: NEGATIVE
Influenza B, POC: NEGATIVE

## 2019-04-27 MED ORDER — SODIUM CHLORIDE 0.9 % IV BOLUS
1000.0000 mL | Freq: Once | INTRAVENOUS | Status: AC
  Administered 2019-04-27: 1000 mL via INTRAVENOUS

## 2019-04-27 MED ORDER — SODIUM CHLORIDE 0.9 % IV SOLN: INTRAVENOUS | Status: DC

## 2019-04-27 MED ORDER — ONDANSETRON 4 MG PO TBDP
4.0000 mg | ORAL_TABLET | Freq: Once | ORAL | Status: AC
  Administered 2019-04-27: 4 mg via ORAL

## 2019-04-27 NOTE — ED Notes (Signed)
An After Visit Summary was printed and given to the patient. Discharge instructions given to patient and no further questions at this time. Pt able to ambulate with cane to vehicle, wife driving patient home. Pt denies SOB at this time.

## 2019-04-27 NOTE — ED Triage Notes (Signed)
Patient reports 3 days of weakness, nausea and vomiting, diarrhea, fever and chills, headache.  He has not travelled outside of [place] past 4 weeks. He has not had influenza vacc this season.

## 2019-04-27 NOTE — ED Triage Notes (Signed)
Brought back from entrance in wheelchair due to his request since he is so weak.

## 2019-04-27 NOTE — Discharge Instructions (Signed)
°  You have declined EMS transport but due to testing positive for Covid-19 and the severity of your symptoms- severe weakness and low blood pressure when changing positions, it is recommended you receive further evaluation and treatment at  [facility] in [place], which is dedicated to Covid-19 patients.  Please have your wife drive you directly there.

## 2019-04-27 NOTE — ED Triage Notes (Signed)
Pt c/o weakness. Pt states he found out today he is COVID+. Pt denies SOB, cough, fever. Pt states he has no appetite.

## 2019-04-27 NOTE — Discharge Instructions (Addendum)
Person Under Monitoring Name: Douglas Waters  Location: [address] [place] [address]   Infection Prevention Recommendations for Individuals Confirmed to have, or Being Evaluated for, 2019 Novel Coronavirus (COVID-19) Infection Who Receive Care at Home  Individuals who are confirmed to have, or are being evaluated for, COVID-19 should follow the prevention steps below until a healthcare provider or local or state health department says they can return to normal activities.  Stay home except to get medical care You should restrict activities outside your home, except for getting medical care. Do not go to work, school, or public areas, and do not use public transportation or taxis.  Call ahead before visiting your doctor Before your medical appointment, call the healthcare provider and tell them that you have, or are being evaluated for, COVID-19 infection. This will help the healthcare providers office take steps to keep other people from getting infected. Ask your healthcare provider to call the local or state health department.  Monitor your symptoms Seek prompt medical attention if your illness is worsening (e.g., difficulty breathing). Before going to your medical appointment, call the healthcare provider and tell them that you have, or are being evaluated for, COVID-19 infection. Ask your healthcare provider to call the local or state health department.  Wear a facemask You should wear a facemask that covers your nose and mouth when you are in the same room with other people and when you visit a healthcare provider. People who live with or visit you should also wear a facemask while they are in the same room with you.  Separate yourself from other people in your home As much as possible, you should stay in a different room from other people in your home. Also, you should use a separate bathroom, if available.  Avoid sharing household items You should not share  dishes, drinking glasses, cups, eating utensils, towels, bedding, or other items with other people in your home. After using these items, you should wash them thoroughly with soap and water.  Cover your coughs and sneezes Cover your mouth and nose with a tissue when you cough or sneeze, or you can cough or sneeze into your sleeve. Throw used tissues in a lined trash can, and immediately wash your hands with soap and water for at least 20 seconds or use an alcohol-based hand rub.  Wash your [facility] your hands often and thoroughly with soap and water for at least 20 seconds. You can use an alcohol-based hand sanitizer if soap and water are not available and if your hands are not visibly dirty. Avoid touching your eyes, nose, and mouth with unwashed hands.   Prevention Steps for Caregivers and Household Members of Individuals Confirmed to have, or Being Evaluated for, COVID-19 Infection Being Cared for in the Home  If you live with, or provide care at home for, a person confirmed to have, or being evaluated for, COVID-19 infection please follow these guidelines to prevent infection:  Follow healthcare providers instructions Make sure that you understand and can help the patient follow any healthcare provider instructions for all care.  Provide for the patients basic needs You should help the patient with basic needs in the home and provide support for getting groceries, prescriptions, and other personal needs.  Monitor the patients symptoms If they are getting sicker, call his or her medical provider and tell them that the patient has, or is being evaluated for, COVID-19 infection. This will help the healthcare providers office  take steps to keep other people from getting infected. °Ask the healthcare provider to call the local or state health department. ° °Limit the number of people who have contact with the patient °If possible, have only one caregiver for the patient. °Other  household members should stay in another home or place of residence. If this is not possible, they should stay °in another room, or be separated from the patient as much as possible. Use a separate bathroom, if available. °Restrict visitors who do not have an essential need to be in the home. ° °Keep older adults, very young children, and other sick people away from the patient °Keep older adults, very young children, and those who have compromised immune systems or chronic health conditions away from the patient. This includes people with chronic heart, lung, or kidney conditions, diabetes, and cancer. ° °Ensure good ventilation °Make sure that shared spaces in the home have good air flow, such as from an air conditioner or an opened window, °weather permitting. ° °Wash your hands often °Wash your hands often and thoroughly with soap and water for at least 20 seconds. You can use an alcohol based hand sanitizer if soap and water are not available and if your hands are not visibly dirty. °Avoid touching your eyes, nose, and mouth with unwashed hands. °Use disposable paper towels to dry your hands. If not available, use dedicated cloth towels and replace them when they become wet. ° °Wear a facemask and gloves °Wear a disposable facemask at all times in the room and gloves when you touch or have contact with the patient’s blood, body fluids, and/or secretions or excretions, such as sweat, saliva, sputum, nasal mucus, vomit, urine, or feces.  Ensure the mask fits over your nose and mouth tightly, and do not touch it during use. °Throw out disposable facemasks and gloves after using them. Do not reuse. °Wash your hands immediately after removing your facemask and gloves. °If your personal clothing becomes contaminated, carefully remove clothing and launder. Wash your hands after handling contaminated clothing. °Place all used disposable facemasks, gloves, and other waste in a lined container before disposing them with  other household waste. °Remove gloves and wash your hands immediately after handling these items. ° °Do not share dishes, glasses, or other household items with the patient °Avoid sharing household items. You should not share dishes, drinking glasses, cups, eating utensils, towels, bedding, or other items with a patient who is confirmed to have, or being evaluated for, COVID-19 infection. °After the person uses these items, you should wash them thoroughly with soap and water. ° °Wash laundry thoroughly °Immediately remove and wash clothes or bedding that have blood, body fluids, and/or secretions or excretions, such as sweat, saliva, sputum, nasal mucus, vomit, urine, or feces, on them. °Wear gloves when handling laundry from the patient. °Read and follow directions on labels of laundry or clothing items and detergent. In general, wash and dry with the warmest temperatures recommended on the label. ° °Clean all areas the individual has used often °Clean all touchable surfaces, such as counters, tabletops, doorknobs, bathroom fixtures, toilets, phones, keyboards, tablets, and bedside tables, every day. Also, clean any surfaces that may have blood, body fluids, and/or secretions or excretions on them. °Wear gloves when cleaning surfaces the patient has come in contact with. °Use a diluted bleach solution (e.g., dilute bleach with 1 part bleach and 10 parts water) or a household disinfectant with a label that says EPA-registered for coronaviruses. To make a bleach   solution at home, add 1 tablespoon of bleach to 1 quart (4 cups) of water. For a larger supply, add  cup of bleach to 1 gallon (16 cups) of water. Read labels of cleaning products and follow recommendations provided on product labels. Labels contain instructions for safe and effective use of the cleaning product including precautions you should take when applying the product, such as wearing gloves or eye protection and making sure you have good ventilation  during use of the product. Remove gloves and wash hands immediately after cleaning.  Monitor yourself for signs and symptoms of illness Caregivers and household members are considered close contacts, should monitor their health, and will be asked to limit movement outside of the home to the extent possible. Follow the monitoring steps for close contacts listed on the symptom monitoring form.   ? If you have additional questions, contact your local health department or call the epidemiologist on call at [phone] (available 24/7). ? This guidance is subject to change. For the most up-to-date guidance from [facility], please refer to their website: [email or web address]
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
## 2019-05-29 ENCOUNTER — Other Ambulatory Visit: Payer: Self-pay | Admitting: Internal Medicine

## 2019-05-29 DIAGNOSIS — I10 Essential (primary) hypertension: Secondary | ICD-10-CM

## 2019-07-07 ENCOUNTER — Other Ambulatory Visit: Payer: Self-pay | Admitting: Internal Medicine

## 2019-07-07 DIAGNOSIS — I10 Essential (primary) hypertension: Secondary | ICD-10-CM

## 2019-07-09 ENCOUNTER — Other Ambulatory Visit: Payer: Self-pay | Admitting: Internal Medicine

## 2019-07-09 NOTE — Telephone Encounter (Addendum)
Prescription refill request for Xarelto received.   Last office visit: Lovena Le 05/05/2018 Weight: 102.1kg Age: 69 y.o. Scr: 1.38, 04/27/2019 CrCl: 73 ml/min  Pt overdue for an office visit. Called pt and he scheduled an appointment for 08/29/2019 with Dr. Lovena Le. Will refill medication to get him to his appointment.

## 2019-08-17 ENCOUNTER — Ambulatory Visit (INDEPENDENT_AMBULATORY_CARE_PROVIDER_SITE_OTHER): Payer: Medicare Other | Admitting: Internal Medicine

## 2019-08-17 ENCOUNTER — Encounter: Payer: Self-pay | Admitting: Internal Medicine

## 2019-08-17 ENCOUNTER — Other Ambulatory Visit: Payer: Self-pay

## 2019-08-17 VITALS — BP 140/70 | HR 66 | Temp 98.0°F | Ht 73.0 in | Wt 240.0 lb

## 2019-08-17 DIAGNOSIS — Z8659 Personal history of other mental and behavioral disorders: Secondary | ICD-10-CM

## 2019-08-17 DIAGNOSIS — Z9889 Other specified postprocedural states: Secondary | ICD-10-CM

## 2019-08-17 DIAGNOSIS — Z0000 Encounter for general adult medical examination without abnormal findings: Secondary | ICD-10-CM | POA: Diagnosis not present

## 2019-08-17 DIAGNOSIS — Z13220 Encounter for screening for lipoid disorders: Secondary | ICD-10-CM

## 2019-08-17 DIAGNOSIS — R768 Other specified abnormal immunological findings in serum: Secondary | ICD-10-CM

## 2019-08-17 DIAGNOSIS — N529 Male erectile dysfunction, unspecified: Secondary | ICD-10-CM | POA: Diagnosis not present

## 2019-08-17 DIAGNOSIS — R7989 Other specified abnormal findings of blood chemistry: Secondary | ICD-10-CM

## 2019-08-17 DIAGNOSIS — I480 Paroxysmal atrial fibrillation: Secondary | ICD-10-CM | POA: Diagnosis not present

## 2019-08-17 DIAGNOSIS — R739 Hyperglycemia, unspecified: Secondary | ICD-10-CM

## 2019-08-17 DIAGNOSIS — Z125 Encounter for screening for malignant neoplasm of prostate: Secondary | ICD-10-CM

## 2019-08-17 DIAGNOSIS — N62 Hypertrophy of breast: Secondary | ICD-10-CM

## 2019-08-17 DIAGNOSIS — F419 Anxiety disorder, unspecified: Secondary | ICD-10-CM

## 2019-08-17 DIAGNOSIS — G8929 Other chronic pain: Secondary | ICD-10-CM

## 2019-08-17 DIAGNOSIS — Z8616 Personal history of COVID-19: Secondary | ICD-10-CM | POA: Diagnosis not present

## 2019-08-17 DIAGNOSIS — Z0184 Encounter for antibody response examination: Secondary | ICD-10-CM

## 2019-08-17 DIAGNOSIS — E7800 Pure hypercholesterolemia, unspecified: Secondary | ICD-10-CM

## 2019-08-17 DIAGNOSIS — N401 Enlarged prostate with lower urinary tract symptoms: Secondary | ICD-10-CM

## 2019-08-17 DIAGNOSIS — R351 Nocturia: Secondary | ICD-10-CM

## 2019-08-17 DIAGNOSIS — Z7901 Long term (current) use of anticoagulants: Secondary | ICD-10-CM

## 2019-08-17 DIAGNOSIS — M545 Low back pain: Secondary | ICD-10-CM

## 2019-08-17 LAB — POCT URINALYSIS DIPSTICK
Appearance: NEGATIVE
Bilirubin, UA: NEGATIVE
Blood, UA: NEGATIVE
Glucose, UA: NEGATIVE
Ketones, UA: NEGATIVE
Leukocytes, UA: NEGATIVE
Nitrite, UA: NEGATIVE
Odor: NEGATIVE
Protein, UA: NEGATIVE
Spec Grav, UA: 1.02 (ref 1.010–1.025)
Urobilinogen, UA: 0.2 E.U./dL
pH, UA: 6.5 (ref 5.0–8.0)
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
## 2019-08-21 LAB — CBC WITH DIFFERENTIAL/PLATELET
Absolute Monocytes: 442 cells/uL (ref 200–950)
Basophils Absolute: 53 cells/uL (ref 0–200)
Basophils Relative: 0.8 %
Eosinophils Absolute: 370 cells/uL (ref 15–500)
Eosinophils Relative: 5.6 %
HCT: 44.6 % (ref 38.5–50.0)
Hemoglobin: 15.3 g/dL (ref 13.2–17.1)
Lymphs Abs: 871 cells/uL (ref 850–3900)
MCH: 27.6 pg (ref 27.0–33.0)
MCHC: 34.3 g/dL (ref 32.0–36.0)
MCV: 80.4 fL (ref 80.0–100.0)
MPV: 9.7 fL (ref 7.5–12.5)
Monocytes Relative: 6.7 %
Neutro Abs: 4864 cells/uL (ref 1500–7800)
Neutrophils Relative %: 73.7 %
Platelets: 208 10*3/uL (ref 140–400)
RBC: 5.55 10*6/uL (ref 4.20–5.80)
RDW: 13.7 % (ref 11.0–15.0)
Total Lymphocyte: 13.2 %
WBC: 6.6 10*3/uL (ref 3.8–10.8)

## 2019-08-21 LAB — COMPLETE METABOLIC PANEL WITH GFR
AG Ratio: 1.4 (calc) (ref 1.0–2.5)
ALT: 14 U/L (ref 9–46)
AST: 17 U/L (ref 10–35)
Albumin: 4.2 g/dL (ref 3.6–5.1)
Alkaline phosphatase (APISO): 89 U/L (ref 35–144)
BUN: 14 mg/dL (ref 7–25)
CO2: 27 mmol/L (ref 20–32)
Calcium: 10.2 mg/dL (ref 8.6–10.3)
Chloride: 103 mmol/L (ref 98–110)
Creat: 0.92 mg/dL (ref 0.70–1.25)
GFR, Est African American: 98 mL/min/{1.73_m2} (ref >=60)
GFR, Est Non African American: 85 mL/min/{1.73_m2} (ref >=60)
Globulin: 3 g/dL (calc) (ref 1.9–3.7)
Glucose, Bld: 110 mg/dL — ABNORMAL HIGH (ref 65–99)
Potassium: 4 mmol/L (ref 3.5–5.3)
Sodium: 140 mmol/L (ref 135–146)
Total Bilirubin: 1.5 mg/dL — ABNORMAL HIGH (ref 0.2–1.2)
Total Protein: 7.2 g/dL (ref 6.1–8.1)

## 2019-08-21 LAB — LIPID PANEL
Cholesterol: 176 mg/dL (ref <200)
HDL: 43 mg/dL (ref >=40)
LDL Cholesterol (Calc): 116 mg/dL (calc) — ABNORMAL HIGH
Non-HDL Cholesterol (Calc): 133 mg/dL (calc) — ABNORMAL HIGH (ref <130)
Total CHOL/HDL Ratio: 4.1 (calc) (ref <5.0)
Triglycerides: 78 mg/dL (ref <150)

## 2019-08-21 LAB — TEST AUTHORIZATION

## 2019-08-21 LAB — PSA: PSA: 0.8 ng/mL (ref <=4.0)

## 2019-08-21 LAB — HEMOGLOBIN A1C W/OUT EAG: Hgb A1c MFr Bld: 5.5 % of total Hgb (ref <5.7)

## 2019-08-21 LAB — SAR COV2 SEROLOGY (COVID19)AB(IGG),IA: SARS CoV2 AB IGG: POSITIVE — AB

## 2019-08-29 ENCOUNTER — Other Ambulatory Visit: Payer: Self-pay

## 2019-08-29 ENCOUNTER — Ambulatory Visit (INDEPENDENT_AMBULATORY_CARE_PROVIDER_SITE_OTHER): Payer: Medicare Other | Admitting: Internal Medicine

## 2019-08-29 VITALS — BP 144/82 | HR 66 | Ht 73.0 in | Wt 241.0 lb

## 2019-08-29 DIAGNOSIS — R001 Bradycardia, unspecified: Secondary | ICD-10-CM | POA: Diagnosis not present

## 2019-08-29 DIAGNOSIS — I480 Paroxysmal atrial fibrillation: Secondary | ICD-10-CM

## 2019-08-29 DIAGNOSIS — I10 Essential (primary) hypertension: Secondary | ICD-10-CM

## 2019-08-29 NOTE — Patient Instructions (Addendum)
Medication Instructions:  Your physician recommends that you continue on your current medications as directed. Please refer to the Current Medication list given to you today.  Labwork: None ordered.  Testing/Procedures:  Will need 14 day zio monitor.  Please let us know when you would like [place] to order.  Follow-Up: Your physician wants you to follow-up in: one year with Dr. Lovena Le.   You will receive a reminder letter in the mail two months in advance. If you don't receive a letter, please call our office to schedule the follow-up appointment.  Any Other Special Instructions Will Be Listed Below (If Applicable).  If you need a refill on your cardiac medications before your next appointment, please call your pharmacy.
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
## 2019-09-09 NOTE — Patient Instructions (Signed)
Patient declined COVID-19 vaccine at present time.  He does have a positive COVID-19 antibody.  Continue current medications and follow-up in 1 year or as needed.  He will continue to be seen by cardiologist and by pain management clinic.

## 2019-09-12 ENCOUNTER — Telehealth: Payer: Self-pay | Admitting: Internal Medicine

## 2019-09-12 NOTE — Telephone Encounter (Signed)
Douglas Waters [phone]  [name] called to see if you would write a letter for him to be dismissed from jury duty, because he can not sit for long periods of time because of back pain. He called his pain management first and they will not write letter for him. Jury duty is 10/01/19.

## 2019-09-12 NOTE — Telephone Encounter (Signed)
Spoke with patients wife, and let her know we need letter about jury duty, so she can write a letter to get him excused. They will bring by tomorrow.

## 2019-09-13 DIAGNOSIS — G8929 Other chronic pain: Secondary | ICD-10-CM

## 2019-09-13 DIAGNOSIS — M545 Low back pain: Secondary | ICD-10-CM

## 2019-09-13 NOTE — Telephone Encounter (Signed)
Patient dropped off letter from clerk of court.

## 2019-09-19 ENCOUNTER — Encounter: Payer: Self-pay | Admitting: Internal Medicine

## 2019-09-19 NOTE — Telephone Encounter (Signed)
Typed letter and have called patient to come and sign form that has to be faxed back to [place] of court.

## 2019-09-19 NOTE — Telephone Encounter (Signed)
Faxed letter to [place] [phone]

## 2019-11-10 ENCOUNTER — Other Ambulatory Visit: Payer: Self-pay | Admitting: Internal Medicine

## 2019-11-10 DIAGNOSIS — I10 Essential (primary) hypertension: Secondary | ICD-10-CM

## 2019-11-13 NOTE — Telephone Encounter (Signed)
Xarelto 20mg  refill request received. Pt is 69 years old, weight-109.3 kg, Crea-0.92 on 08/17/2019, last seen by Dr. Lovena Le on 08/29/2019, Diagnosis-Afib, CrCl-115.51ml/min; Dose is appropriate based on dosing criteria. Will send in refill to requested pharmacy.

## 2019-11-26 ENCOUNTER — Other Ambulatory Visit: Payer: Self-pay | Admitting: Internal Medicine

## 2019-11-26 MED ORDER — CLONAZEPAM 0.5 MG PO TABS: 0.5000 mg | ORAL_TABLET | Freq: Two times a day (BID) | ORAL | 5 refills | Status: DC | PRN

## 2019-11-26 NOTE — Telephone Encounter (Signed)
Douglas Waters [phone]  Pam called to say that [name] took his last medication below last night and would like a refill if possible.  clonazePAM (KLONOPIN) 0.5 MG tablet   [place], [place] Phone:  [phone]  Fax:  [phone]

## 2019-11-26 NOTE — Telephone Encounter (Signed)
Refill x 6 months 

## 2020-03-21 ENCOUNTER — Other Ambulatory Visit: Payer: Self-pay | Admitting: Internal Medicine

## 2020-03-21 DIAGNOSIS — I10 Essential (primary) hypertension: Secondary | ICD-10-CM

## 2020-04-01 ENCOUNTER — Telehealth: Payer: Self-pay | Admitting: Internal Medicine

## 2020-04-01 ENCOUNTER — Encounter: Payer: Self-pay | Admitting: *Deleted

## 2020-04-01 DIAGNOSIS — R0602 Shortness of breath: Secondary | ICD-10-CM

## 2020-04-01 DIAGNOSIS — R001 Bradycardia, unspecified: Secondary | ICD-10-CM

## 2020-04-01 NOTE — Progress Notes (Signed)
Patient ID: Douglas Waters, male   DOB: 1950/07/25, 69 y.o.   MRN: [id number] Patient enrolled for [employer] to ship a 14 day ZIO XT long term holter monitor to his home.

## 2020-04-01 NOTE — Telephone Encounter (Signed)
Returned call to pt.  Advised per last OV with Dr. Lovena Le in March of this year-Dr. Lovena Le had advised Pt he needed to wear a heart monitor to correlate his symptoms.  Pt denies hearing that at last office visit, but he is willing to wear monitor.  Advised we would ship him a 14 day monitor to wear.  Advised would follow up after results received.  Pt indicates understanding.

## 2020-04-01 NOTE — Telephone Encounter (Signed)
Pt called in and stated he would like to see if there is anyway he could get worked in anytime soon with Dr Lovena Le.  He stated it is time to talk to him about a pacemaker that Dr Lovena Le has suggested to him for a while.  He stated it was time and wants to get this going as soon as he can.     Best number [phone]

## 2020-04-02 NOTE — Telephone Encounter (Signed)
Monitor has been mailed to Pt.  Will follow up based on results.

## 2020-04-03 ENCOUNTER — Ambulatory Visit (INDEPENDENT_AMBULATORY_CARE_PROVIDER_SITE_OTHER): Payer: Medicare Other

## 2020-04-03 DIAGNOSIS — R001 Bradycardia, unspecified: Secondary | ICD-10-CM

## 2020-04-03 DIAGNOSIS — R0602 Shortness of breath: Secondary | ICD-10-CM

## 2020-05-01 ENCOUNTER — Telehealth: Payer: Self-pay

## 2020-05-01 NOTE — Telephone Encounter (Signed)
Call placed to Pt and left detailed message per DPR.  Advised that per results of heart monitor Dr. Lovena Le does not think there is a clear indication for pacemaker at this time.  Advised Pt to call [facility] @ [phone] to schedule appt with DR. Lovena Le to discuss HM results.  Will forward to scheduler.

## 2020-05-20 ENCOUNTER — Other Ambulatory Visit: Payer: Self-pay

## 2020-05-20 ENCOUNTER — Encounter: Payer: Self-pay | Admitting: Internal Medicine

## 2020-05-20 ENCOUNTER — Ambulatory Visit (INDEPENDENT_AMBULATORY_CARE_PROVIDER_SITE_OTHER): Payer: Medicare Other | Admitting: Internal Medicine

## 2020-05-20 VITALS — BP 140/82 | HR 54 | Ht 73.0 in | Wt 233.0 lb

## 2020-05-20 DIAGNOSIS — I480 Paroxysmal atrial fibrillation: Secondary | ICD-10-CM

## 2020-05-20 DIAGNOSIS — I10 Essential (primary) hypertension: Secondary | ICD-10-CM

## 2020-05-20 DIAGNOSIS — R001 Bradycardia, unspecified: Secondary | ICD-10-CM | POA: Diagnosis not present

## 2020-05-20 DIAGNOSIS — R072 Precordial pain: Secondary | ICD-10-CM

## 2020-05-20 LAB — BASIC METABOLIC PANEL
BUN/Creatinine Ratio: 16 (ref 10–24)
BUN: 16 mg/dL (ref 8–27)
CO2: 27 mmol/L (ref 20–29)
Calcium: 10.2 mg/dL (ref 8.6–10.2)
Chloride: 97 mmol/L (ref 96–106)
Creatinine, Ser: 1.01 mg/dL (ref 0.76–1.27)
GFR calc Af Amer: 87 mL/min/{1.73_m2} (ref >59)
GFR calc non Af Amer: 75 mL/min/{1.73_m2} (ref >59)
Glucose: 102 mg/dL — ABNORMAL HIGH (ref 65–99)
Potassium: 4.2 mmol/L (ref 3.5–5.2)
Sodium: 138 mmol/L (ref 134–144)

## 2020-05-20 MED ORDER — METOPROLOL TARTRATE 25 MG PO TABS: ORAL_TABLET | ORAL | 0 refills | Status: DC

## 2020-05-20 NOTE — Patient Instructions (Addendum)
Medication Instructions:  Your physician recommends that you continue on your current medications as directed. Please refer to the Current Medication list given to you today.  Labwork: You will get lab work today:  BMP  Testing/Procedures: Your physician has requested that you have cardiac CT. Cardiac computed tomography (CT) is a painless test that uses an x-ray machine to take clear, detailed pictures of your heart.    Follow-Up: Your physician wants you to follow-up based on results of cardiac CT.  Any Other Special Instructions Will Be Listed Below (If Applicable).  If you need a refill on your cardiac medications before your next appointment, please call your pharmacy.   CARDIAC CT INSTRUCTIONS:  [facility] [address] [place], [place] [address] [phone]  Please arrive at the [facility] main entrance of [facility] 30 minutes prior to test start time. Proceed to the [facility] Radiology Department (first floor) to check-in and test prep.  Please follow these instructions carefully (unless otherwise directed):  Hold all erectile dysfunction medications at least 3 days (72 hrs) prior to test.  On the Night Before the Test: . Be sure to Drink plenty of water. . Do not consume any caffeinated/decaffeinated beverages or chocolate 12 hours prior to your test. . Do not take any antihistamines 12 hours prior to your test.  On the Day of the Test: . Drink plenty of water. Do not drink any water within one hour of the test. . Do not eat any food 4 hours prior to the test. . You may take your regular medications prior to the test.  . CHECK your heart rate 2 hours prior to your test.  IF your heart rate is greater than 55 beats per minute TAKE metoprolol tartrate 25 mg one tablet by mouth.  IF your heart rate is 55 beats per minute or less do NOT take the metoprolol                  After the Test: . Drink plenty of water. . After receiving IV  contrast, you may experience a mild flushed feeling. This is normal. . On occasion, you may experience a mild rash up to 24 hours after the test. This is not dangerous. If this occurs, you can take Benadryl 25 mg and increase your fluid intake. . If you experience trouble breathing, this can be serious. If it is severe call 911 IMMEDIATELY. If it is mild, please call our office.   Once we have confirmed authorization from your insurance company, we will call you to set up a date and time for your test. Based on how quickly your insurance processes prior authorizations requests, please allow up to 4 weeks to be contacted for scheduling your Cardiac CT appointment. Be advised that routine Cardiac CT appointments could be scheduled as many as 8 weeks after your provider has ordered it.  For non-scheduling related questions, please contact the cardiac imaging nurse navigator should you have any questions/concerns: Marchia Bond, Cardiac Imaging Nurse Navigator Burley Saver, Interim Cardiac Imaging Nurse [facility] Direct Office Dial: [phone]   For scheduling needs, including cancellations and rescheduling, please call [place], [phone].
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
## 2020-05-23 ENCOUNTER — Telehealth (HOSPITAL_COMMUNITY): Payer: Self-pay | Admitting: Emergency Medicine

## 2020-05-23 NOTE — Telephone Encounter (Signed)
Reaching out to patient to offer assistance regarding upcoming cardiac imaging study; pt verbalizes understanding of appt date/time, parking situation and where to check in, pre-test NPO status and medications ordered, and verified current allergies; name and call back number provided for further questions should they arise Marchia Bond RN Navigator Cardiac Imaging [facility] [phone] office [phone] cell   25mg  metoprolol tartrate 2 hr prior to scan.  Time taken to explain to wife how to check patients pulse prior to med admin.  pts wife verbalized understanding. She states her daughter is also a Marine scientist.  Clarise Cruz

## 2020-05-26 ENCOUNTER — Encounter (HOSPITAL_COMMUNITY): Payer: Self-pay

## 2020-05-26 ENCOUNTER — Other Ambulatory Visit: Payer: Self-pay

## 2020-05-26 ENCOUNTER — Ambulatory Visit (HOSPITAL_COMMUNITY)
Admission: RE | Admit: 2020-05-26 | Discharge: 2020-05-26 | Disposition: A | Discharge status: Home / Self Care | Payer: Medicare Other | Source: Ambulatory Visit | Attending: Internal Medicine | Admitting: Internal Medicine

## 2020-05-26 DIAGNOSIS — R072 Precordial pain: Secondary | ICD-10-CM

## 2020-05-26 NOTE — Progress Notes (Signed)
Patient here today for CT coronary. Patient has hx of Afib and received phone call from Dr. Johnsie Cancel that if patient is still in Afib at this time to cancel scan and he will notify Dr. Lovena Le. Sara-CT heart navigator at the bedside speaking with patient as he is currently in Afib rates in the 30-50s. Patient understanding of re-scheduling.

## 2020-05-30 ENCOUNTER — Telehealth (HOSPITAL_COMMUNITY): Payer: Self-pay | Admitting: Emergency Medicine

## 2020-05-30 NOTE — Telephone Encounter (Signed)
Pts wife calling regarding CCTA that was cancelled this week due to the patient being in afib.   Wife would like a phone call with update regarding the next steps/plan of care.   Marchia Bond RN Navigator Cardiac Imaging [facility] [phone] Office  [phone] Cell

## 2020-06-02 ENCOUNTER — Telehealth: Payer: Self-pay

## 2020-06-02 DIAGNOSIS — R072 Precordial pain: Secondary | ICD-10-CM

## 2020-06-02 NOTE — Telephone Encounter (Signed)
Call placed to Pt's wife.  Advised per Dr. Lovena Le Pt will have lexiscan myoview instead of cardiac CT.  Family indicates understanding.  Order placed.  Attestation sent to Dr. Lovena Le.

## 2020-06-03 ENCOUNTER — Telehealth (HOSPITAL_COMMUNITY): Payer: Self-pay | Admitting: *Deleted

## 2020-06-03 NOTE — Telephone Encounter (Signed)
Left message on voicemail per DPR in reference to upcoming appointment scheduled on 06/11/2020 at 1000 with detailed instructions given per Myocardial Perfusion Study Information Sheet for the test. LM to arrive 15 minutes early, and that it is imperative to arrive on time for appointment to keep from having the test rescheduled. If you need to cancel or reschedule your appointment, please call the office within 24 hours of your appointment. Failure to do so may result in a cancellation of your appointment, and a $50 no show fee. Phone number given for call back for any questions.  No mychart available.

## 2020-06-08 NOTE — Telephone Encounter (Signed)
I agree with scheduling Lexiscan myoview due to atrial fib. GT

## 2020-06-11 ENCOUNTER — Other Ambulatory Visit: Payer: Self-pay

## 2020-06-11 ENCOUNTER — Ambulatory Visit (HOSPITAL_COMMUNITY): Payer: Medicare Other | Attending: Cardiology

## 2020-06-11 DIAGNOSIS — R072 Precordial pain: Secondary | ICD-10-CM | POA: Diagnosis present

## 2020-06-11 LAB — MYOCARDIAL PERFUSION IMAGING
LV dias vol: 108 mL (ref 62–150)
LV sys vol: 45 mL
Peak HR: 76 {beats}/min
Rest HR: 53 {beats}/min
SDS: 0
SRS: 0
SSS: 0
TID: 0.86

## 2020-06-11 MED ORDER — TECHNETIUM TC 99M TETROFOSMIN IV KIT
31.6000 | PACK | Freq: Once | INTRAVENOUS | Status: AC | PRN
  Administered 2020-06-11: 31.6 via INTRAVENOUS
  Filled 2020-06-11: qty 32

## 2020-06-11 MED ORDER — TECHNETIUM TC 99M TETROFOSMIN IV KIT
10.1000 | PACK | Freq: Once | INTRAVENOUS | Status: AC | PRN
  Administered 2020-06-11: 10.1 via INTRAVENOUS
  Filled 2020-06-11: qty 11

## 2020-06-11 MED ORDER — REGADENOSON 0.4 MG/5ML IV SOLN
0.4000 mg | Freq: Once | INTRAVENOUS | Status: AC
  Administered 2020-06-11: 0.4 mg via INTRAVENOUS

## 2020-06-19 ENCOUNTER — Telehealth: Payer: Self-pay | Admitting: Internal Medicine

## 2020-06-19 NOTE — Telephone Encounter (Signed)
Spoke with patient and reviewed his stress results.

## 2020-06-19 NOTE — Telephone Encounter (Signed)
Wife of patient called. Patient had a stress test done 12.29.21 but has not heard the results yet. Wife was just calling to follow up

## 2020-09-22 ENCOUNTER — Encounter: Payer: Medicare Other | Admitting: Internal Medicine

## 2020-09-30 ENCOUNTER — Other Ambulatory Visit: Payer: Self-pay | Admitting: Internal Medicine

## 2020-09-30 NOTE — Telephone Encounter (Signed)
Xarelto 20mg  refill request received. Pt is 70 years old, weight-105.7kg, Crea-1.01 on 05/20/2020, last seen by Dr. Lovena Le on 05/20/2020, Diagnosis-Afib, CrCl-101.68ml/min; Dose is appropriate based on dosing criteria. Will send in refill to requested pharmacy.

## 2020-11-05 ENCOUNTER — Telehealth: Payer: Self-pay | Admitting: Internal Medicine

## 2020-11-05 NOTE — Telephone Encounter (Signed)
LVM to CB and reschedule appointment on 11/17/2020

## 2020-11-06 NOTE — Telephone Encounter (Signed)
scheduled

## 2020-11-17 ENCOUNTER — Encounter: Payer: Medicare Other | Admitting: Internal Medicine

## 2021-01-08 ENCOUNTER — Other Ambulatory Visit: Payer: Self-pay | Admitting: Internal Medicine

## 2021-01-08 DIAGNOSIS — I10 Essential (primary) hypertension: Secondary | ICD-10-CM

## 2021-03-16 ENCOUNTER — Other Ambulatory Visit: Payer: Self-pay | Admitting: Internal Medicine

## 2021-03-16 ENCOUNTER — Ambulatory Visit (INDEPENDENT_AMBULATORY_CARE_PROVIDER_SITE_OTHER): Payer: Medicare Other | Admitting: Internal Medicine

## 2021-03-16 ENCOUNTER — Other Ambulatory Visit: Payer: Self-pay

## 2021-03-16 ENCOUNTER — Encounter: Payer: Self-pay | Admitting: Internal Medicine

## 2021-03-16 VITALS — BP 138/88 | HR 60 | Temp 98.4°F | Ht 72.0 in | Wt 216.0 lb

## 2021-03-16 DIAGNOSIS — F419 Anxiety disorder, unspecified: Secondary | ICD-10-CM

## 2021-03-16 DIAGNOSIS — N529 Male erectile dysfunction, unspecified: Secondary | ICD-10-CM | POA: Diagnosis not present

## 2021-03-16 DIAGNOSIS — R7989 Other specified abnormal findings of blood chemistry: Secondary | ICD-10-CM

## 2021-03-16 DIAGNOSIS — Z7901 Long term (current) use of anticoagulants: Secondary | ICD-10-CM

## 2021-03-16 DIAGNOSIS — Z0000 Encounter for general adult medical examination without abnormal findings: Secondary | ICD-10-CM

## 2021-03-16 DIAGNOSIS — Z9889 Other specified postprocedural states: Secondary | ICD-10-CM

## 2021-03-16 DIAGNOSIS — R739 Hyperglycemia, unspecified: Secondary | ICD-10-CM

## 2021-03-16 DIAGNOSIS — Z130 Encounter for screening for diseases of the blood and blood-forming organs and certain disorders involving the immune mechanism: Secondary | ICD-10-CM

## 2021-03-16 DIAGNOSIS — N401 Enlarged prostate with lower urinary tract symptoms: Secondary | ICD-10-CM

## 2021-03-16 DIAGNOSIS — Z13220 Encounter for screening for lipoid disorders: Secondary | ICD-10-CM

## 2021-03-16 DIAGNOSIS — Z8616 Personal history of COVID-19: Secondary | ICD-10-CM

## 2021-03-16 DIAGNOSIS — I480 Paroxysmal atrial fibrillation: Secondary | ICD-10-CM

## 2021-03-16 DIAGNOSIS — N62 Hypertrophy of breast: Secondary | ICD-10-CM

## 2021-03-16 DIAGNOSIS — Z23 Encounter for immunization: Secondary | ICD-10-CM

## 2021-03-16 DIAGNOSIS — I10 Essential (primary) hypertension: Secondary | ICD-10-CM

## 2021-03-16 DIAGNOSIS — Z8659 Personal history of other mental and behavioral disorders: Secondary | ICD-10-CM | POA: Diagnosis not present

## 2021-03-16 DIAGNOSIS — Z125 Encounter for screening for malignant neoplasm of prostate: Secondary | ICD-10-CM | POA: Diagnosis not present

## 2021-03-16 DIAGNOSIS — R351 Nocturia: Secondary | ICD-10-CM

## 2021-03-16 DIAGNOSIS — E7800 Pure hypercholesterolemia, unspecified: Secondary | ICD-10-CM

## 2021-03-16 LAB — POCT URINALYSIS DIP (MANUAL ENTRY)
Bilirubin, UA: NEGATIVE
Blood, UA: NEGATIVE
Glucose, UA: NEGATIVE mg/dL
Ketones, POC UA: NEGATIVE mg/dL
Leukocytes, UA: NEGATIVE
Nitrite, UA: NEGATIVE
Protein Ur, POC: NEGATIVE mg/dL
Spec Grav, UA: 1.01 (ref 1.010–1.025)
Urobilinogen, UA: NEGATIVE E.U./dL — AB
pH, UA: 6.5 (ref 5.0–8.0)

## 2021-03-16 MED ORDER — LORAZEPAM 0.5 MG PO TABS: ORAL_TABLET | ORAL | 0 refills | Status: DC

## 2021-03-16 NOTE — Patient Instructions (Addendum)
Flu vaccine given today.  Lorazepam 0.5 mg refilled at his request which he takes infrequently for occasional nausea that started when he had tonsil carcinoma.He does not want to be vaccinated for Covid-19. RTC in one year or as needed.  Please call  [facility] about your coronary calcium test.  Continue being seen at chronic pain management.
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
## 2021-03-17 LAB — LIPID PANEL
Cholesterol: 173 mg/dL (ref <200)
HDL: 48 mg/dL (ref >=40)
LDL Cholesterol (Calc): 107 mg/dL (calc) — ABNORMAL HIGH
Non-HDL Cholesterol (Calc): 125 mg/dL (calc) (ref <130)
Total CHOL/HDL Ratio: 3.6 (calc) (ref <5.0)
Triglycerides: 89 mg/dL (ref <150)

## 2021-03-17 LAB — PSA: PSA: 1.5 ng/mL (ref <=4.00)

## 2021-03-17 LAB — CBC WITH DIFFERENTIAL/PLATELET
Absolute Monocytes: 465 cells/uL (ref 200–950)
Basophils Absolute: 37 cells/uL (ref 0–200)
Basophils Relative: 0.6 %
Eosinophils Absolute: 415 cells/uL (ref 15–500)
Eosinophils Relative: 6.7 %
HCT: 48.7 % (ref 38.5–50.0)
Hemoglobin: 16.3 g/dL (ref 13.2–17.1)
Lymphs Abs: 1079 cells/uL (ref 850–3900)
MCH: 27.5 pg (ref 27.0–33.0)
MCHC: 33.5 g/dL (ref 32.0–36.0)
MCV: 82.3 fL (ref 80.0–100.0)
MPV: 10.1 fL (ref 7.5–12.5)
Monocytes Relative: 7.5 %
Neutro Abs: 4204 cells/uL (ref 1500–7800)
Neutrophils Relative %: 67.8 %
Platelets: 224 10*3/uL (ref 140–400)
RBC: 5.92 10*6/uL — ABNORMAL HIGH (ref 4.20–5.80)
RDW: 12.9 % (ref 11.0–15.0)
Total Lymphocyte: 17.4 %
WBC: 6.2 10*3/uL (ref 3.8–10.8)

## 2021-03-17 LAB — COMPREHENSIVE METABOLIC PANEL
AG Ratio: 1.6 (calc) (ref 1.0–2.5)
ALT: 15 U/L (ref 9–46)
AST: 18 U/L (ref 10–35)
Albumin: 4.6 g/dL (ref 3.6–5.1)
Alkaline phosphatase (APISO): 98 U/L (ref 35–144)
BUN: 15 mg/dL (ref 7–25)
CO2: 29 mmol/L (ref 20–32)
Calcium: 10.4 mg/dL — ABNORMAL HIGH (ref 8.6–10.3)
Chloride: 98 mmol/L (ref 98–110)
Creat: 1.05 mg/dL (ref 0.70–1.28)
Globulin: 2.8 g/dL (calc) (ref 1.9–3.7)
Glucose, Bld: 100 mg/dL — ABNORMAL HIGH (ref 65–99)
Potassium: 4.1 mmol/L (ref 3.5–5.3)
Sodium: 138 mmol/L (ref 135–146)
Total Bilirubin: 1.9 mg/dL — ABNORMAL HIGH (ref 0.2–1.2)
Total Protein: 7.4 g/dL (ref 6.1–8.1)

## 2021-04-08 ENCOUNTER — Other Ambulatory Visit: Payer: Self-pay | Admitting: Physician Assistant

## 2021-04-08 DIAGNOSIS — R1310 Dysphagia, unspecified: Secondary | ICD-10-CM

## 2021-04-14 ENCOUNTER — Ambulatory Visit
Admission: RE | Admit: 2021-04-14 | Discharge: 2021-04-14 | Disposition: A | Discharge status: Home / Self Care | Payer: Medicare Other | Source: Ambulatory Visit | Attending: Physician Assistant | Admitting: Physician Assistant

## 2021-04-14 DIAGNOSIS — R1310 Dysphagia, unspecified: Secondary | ICD-10-CM

## 2021-05-04 ENCOUNTER — Telehealth: Payer: Self-pay | Admitting: *Deleted

## 2021-05-04 NOTE — Telephone Encounter (Signed)
   Pre-operative Risk Assessment    Patient Name: Douglas Waters  DOB: 03-28-1951 MRN: [id number]      Request for Surgical Clearance   Procedure:   ENDOSCOPY  Date of Surgery: Clearance 05/21/21                                 Surgeon:  DR. Alessandra Bevels Surgeon's Group or Practice Name:  [facility] Phone number:  [phone] Fax number:  [phone]   Type of Clearance Requested: - Medical  - Pharmacy:  Hold Rivaroxaban (Xarelto) x 3 DAYS PRIOR TO PROCEDURE    Type of Anesthesia:   Not Indicated   Additional requests/questions:   Jiles Prows   05/04/2021, 5:07 PM

## 2021-05-05 NOTE — Telephone Encounter (Signed)
Pharmacy, can you please comment on how long Xarelto can be held for upcoming endoscopy?  Thank you!

## 2021-05-05 NOTE — Telephone Encounter (Signed)
Patient with diagnosis of afib on Xarelto for anticoagulation.    Procedure: endoscopy Date of procedure: 05/21/21  CHA2DS2-VASc Score = 3  This indicates a 3.2% annual risk of stroke. The patient's score is based upon: CHF History: 0 HTN History: 1 Diabetes History: 0 Stroke History: 0 Vascular Disease History: 1 Age Score: 1 Gender Score: 0   CrCl 40mL/min Platelet count 224K  Request is to hold Xarelto for 3 days prior to endoscopy. 3 day DOAC hold not required before endoscopy. Recommend patient hold Xarelto for 1-2 days prior to procedure per protocol.

## 2021-05-06 NOTE — Telephone Encounter (Signed)
Left message to call back and ask to speak with pre-op team.  Darreld Mclean, PA-C 05/06/2021 10:52 AM

## 2021-05-11 NOTE — Telephone Encounter (Signed)
   Name: Douglas Waters  DOB: Apr 15, 1951  MRN: [id number]   Primary Cardiologist: Cristopher Peru, MD  Chart reviewed as part of pre-operative protocol coverage. Patient was contacted 05/11/2021 in reference to pre-operative risk assessment for pending surgery as outlined below.  Douglas Waters was last seen on 05/20/20 by Dr. Lovena Le.  Since that day, Douglas Waters has done well. He had a reassuring nuclear stress test 05/2020. He can complete more than 4.0 METS without angina.   Per our clinical pharmacist: Request is to hold Xarelto for 3 days prior to endoscopy. 3 day DOAC hold not required before endoscopy. Recommend patient hold Xarelto for 1-2 days prior to procedure per protocol.    Therefore, based on ACC/AHA guidelines, the patient would be at acceptable risk for the planned procedure without further cardiovascular testing.   The patient was advised that if he develops new symptoms prior to surgery to contact our office to arrange for a follow-up visit, and he verbalized understanding.  I will route this recommendation to the requesting party via Epic fax function and remove from pre-op pool. Please call with questions.  Douglas Lin Gianah Batt, PA 05/11/2021, 10:15 AM

## 2021-05-11 NOTE — Telephone Encounter (Signed)
Douglas Waters is returning Douglas Waters's call. Requesting a callback before 2:00 so she is able to take the call.

## 2021-05-21 ENCOUNTER — Other Ambulatory Visit: Payer: Self-pay | Admitting: Internal Medicine

## 2021-05-21 NOTE — Telephone Encounter (Signed)
Xarelto 20mg  refill request received. Pt is 70 years old, weight-98kg, Crea-1.05 on 03/16/2021, last seen by Dr. Lovena Le on 05/20/2020, Diagnosis-Afib, CrCl-89.10ml/min; Dose is appropriate based on dosing criteria.  Pt is overdue for Cardiologist Appt; will send a message to schedulers.

## 2021-05-21 NOTE — Telephone Encounter (Signed)
Prescription refill request for Xarelto received.  Indication: Afib  Last office visit: 05/20/20 - Pt has scheduled appt with Dr Lovena Le on 09/04/21 Weight:98kg Age: 70 Scr: 1.05 (03/16/21) CrCl: 89.63ml/min  Appropriate dose and refill sent to requested pharmacy.

## 2021-05-28 ENCOUNTER — Encounter: Payer: Self-pay | Admitting: Internal Medicine

## 2021-08-03 ENCOUNTER — Telehealth: Payer: Self-pay | Admitting: Internal Medicine

## 2021-08-03 ENCOUNTER — Emergency Department (INDEPENDENT_AMBULATORY_CARE_PROVIDER_SITE_OTHER)
Admission: EM | Admit: 2021-08-03 | Discharge: 2021-08-03 | Disposition: A | Discharge status: Home / Self Care | Payer: Medicare Other | Source: Home / Self Care

## 2021-08-03 ENCOUNTER — Other Ambulatory Visit: Payer: Self-pay

## 2021-08-03 DIAGNOSIS — R059 Cough, unspecified: Secondary | ICD-10-CM

## 2021-08-03 MED ORDER — DOXYCYCLINE HYCLATE 100 MG PO CAPS: 100.0000 mg | ORAL_CAPSULE | Freq: Two times a day (BID) | ORAL | 0 refills | Status: AC

## 2021-08-03 MED ORDER — BENZONATATE 200 MG PO CAPS: 200.0000 mg | ORAL_CAPSULE | Freq: Three times a day (TID) | ORAL | 0 refills | Status: AC | PRN

## 2021-08-03 NOTE — Telephone Encounter (Signed)
Pt wife called back and said she decided to go to urgent care. Please disregard message

## 2021-08-03 NOTE — Discharge Instructions (Addendum)
Advised patient to take medication as directed with food to completion.  Advised patient may use Tessalon Perles daily or as needed for cough.  Encouraged patient to increase daily water intake while taking this medication.

## 2021-08-03 NOTE — ED Triage Notes (Signed)
Pt presents to Urgent Care with c/o cough and nasal congestion x 6 days. Reports recent cold chills and fever, although afebrile today. Pt reports having body aches at symptom onset 6 days ago and thinks he had the flu.
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
## 2021-08-03 NOTE — Telephone Encounter (Signed)
Pt has fever, pt wife took temp last night and it was 100.6, he has been coughing and having body aches, has been going on for about a week. Pt would like to be seen. Started feeling better last Thursday but got worse again Friday. When would you like to see him?

## 2021-09-04 ENCOUNTER — Other Ambulatory Visit: Payer: Self-pay

## 2021-09-04 ENCOUNTER — Encounter: Payer: Self-pay | Admitting: Internal Medicine

## 2021-09-04 ENCOUNTER — Encounter (INDEPENDENT_AMBULATORY_CARE_PROVIDER_SITE_OTHER): Payer: Self-pay

## 2021-09-04 ENCOUNTER — Ambulatory Visit (INDEPENDENT_AMBULATORY_CARE_PROVIDER_SITE_OTHER): Payer: Medicare Other | Admitting: Internal Medicine

## 2021-09-04 VITALS — BP 126/68 | HR 59 | Ht 72.0 in | Wt 230.8 lb

## 2021-09-04 DIAGNOSIS — I480 Paroxysmal atrial fibrillation: Secondary | ICD-10-CM | POA: Diagnosis not present

## 2021-09-04 DIAGNOSIS — R001 Bradycardia, unspecified: Secondary | ICD-10-CM
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
## 2021-09-04 NOTE — Patient Instructions (Signed)

## 2021-09-26 ENCOUNTER — Other Ambulatory Visit: Payer: Self-pay | Admitting: Internal Medicine

## 2021-09-26 DIAGNOSIS — I10 Essential (primary) hypertension: Secondary | ICD-10-CM

## 2022-01-11 ENCOUNTER — Telehealth: Payer: Self-pay | Admitting: Internal Medicine

## 2022-01-11 NOTE — Telephone Encounter (Signed)
Pt c/o Shortness Of Breath: STAT if SOB developed within the last 24 hours or pt is noticeably SOB on the phone  1. Are you currently SOB (can you hear that pt is SOB on the phone)? No  2. How long have you been experiencing SOB?   Since March  3. Are you SOB when sitting or when up moving around?   Comes and goes whether he is sitting or moving around  4. Are you currently experiencing any other symptoms?   Patient stated most days he has no energy and does not feel like getting out of bed.

## 2022-01-11 NOTE — Telephone Encounter (Signed)
Called patient back about his symptoms. Patient stated that he just does not feel like he has any energy, he gets SOB at times and he thinks it's due to his heart rhythm. Patient wants to discuss pacemaker or other options with Dr. Lovena Le. Informed patient that we would see about getting him an appointment with Dr. Lovena Le or his PA. Patient will be available after 12:00 pm today to call and set up for an appointment.  Will send message to Dr. Tanna Furry scheduler.
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
## 2022-01-19 ENCOUNTER — Ambulatory Visit
Admission: RE | Admit: 2022-01-19 | Discharge: 2022-01-19 | Disposition: A | Discharge status: Home / Self Care | Payer: Medicare Other | Source: Ambulatory Visit | Attending: Physician Assistant | Admitting: Physician Assistant

## 2022-01-19 ENCOUNTER — Encounter: Payer: Self-pay | Admitting: Physician Assistant

## 2022-01-19 ENCOUNTER — Ambulatory Visit (INDEPENDENT_AMBULATORY_CARE_PROVIDER_SITE_OTHER): Payer: Medicare Other

## 2022-01-19 ENCOUNTER — Ambulatory Visit (INDEPENDENT_AMBULATORY_CARE_PROVIDER_SITE_OTHER): Payer: Medicare Other | Admitting: Physician Assistant

## 2022-01-19 VITALS — BP 130/70 | HR 51 | Ht 72.0 in | Wt 226.0 lb

## 2022-01-19 DIAGNOSIS — I4821 Permanent atrial fibrillation: Secondary | ICD-10-CM | POA: Diagnosis not present

## 2022-01-19 DIAGNOSIS — R5383 Other fatigue: Secondary | ICD-10-CM

## 2022-01-19 DIAGNOSIS — R001 Bradycardia, unspecified: Secondary | ICD-10-CM | POA: Diagnosis not present

## 2022-01-19 DIAGNOSIS — R0602 Shortness of breath: Secondary | ICD-10-CM

## 2022-01-19 DIAGNOSIS — R400 Somnolence: Secondary | ICD-10-CM | POA: Diagnosis not present

## 2022-01-19 NOTE — Patient Instructions (Signed)
Medication Instructions:  Your physician recommends that you continue on your current medications as directed. Please refer to the Current Medication list given to you today.  *If you need a refill on your cardiac medications before your next appointment, please call your pharmacy*   Lab Work: [place]   If you have labs (blood work) drawn today and your tests are completely normal, you will receive your results only by: [place] (if you have MyChart) OR A paper copy in the mail If you have any lab test that is abnormal or we need to change your treatment, we will call you to review the results.   Testing/Procedures: Your physician has requested that you have an echocardiogram. Echocardiography is a painless test that uses sound waves to create images of your heart. It provides your doctor with information about the size and shape of your heart and how well your heart's chambers and valves are working. This procedure takes approximately one hour. There are no restrictions for this procedure.  Your physician has recommended that you wear an event monitor. Event monitors are medical devices that record the heart's electrical activity. Doctors most often [place] these monitors to diagnose arrhythmias. Arrhythmias are problems with the speed or rhythm of the heartbeat. The monitor is a small, portable device. You can wear one while you do your normal daily activities. This is usually used to diagnose what is causing palpitations/syncope (passing out).   A chest x-ray takes a picture of the organs and structures inside the chest, including the heart, lungs, and blood vessels. This test can show several things, including, whether the heart is enlarges; whether fluid is building up in the lungs; and whether pacemaker / defibrillator leads are still in place.  Located in: [facility] Address: [address], [place], [place] [address] Areas served: [place] and  nearby areas Hours:  Tuesday 8AM-5:30PM Wednesday 8AM-5:30PM Thursday 8AM-5:30PM Friday 8AM-5:30PM Saturday Closed Sunday Closed Monday 8AM-5:30PM Phone: [phone]   Your physician has recommended that you have a sleep study. This test records several body functions during sleep, including: brain activity, eye movement, oxygen and carbon dioxide blood levels, heart rate and rhythm, breathing rate and rhythm, the flow of air through your mouth and nose, snoring, body muscle movements, and chest and belly movement. You will be contacted  back by someone in the sleep department.     Follow-Up: At [facility], you and your health needs are our priority.  As part of our continuing mission to provide you with exceptional heart care, we have created designated Provider Care Teams.  These Care Teams include your primary Cardiologist (physician) and Advanced Practice Providers (APPs -  Physician Assistants and Nurse Practitioners) who all work together to provide you with the care you need, when you need it.  We recommend signing up for the patient portal called "MyChart".  Sign up information is provided on this After Visit Summary.  MyChart is used to connect with patients for Virtual Visits (Telemedicine).  Patients are able to view lab/test results, encounter notes, upcoming appointments, etc.  Non-urgent messages can be sent to your provider as well.   To learn more about what you can do with MyChart, go to [email or web address].    Your next appointment:   1 month(s)  The format for your next appointment:   In Person  Provider:   You will see one of the following Advanced Practice Providers on your designated Care Team:  Tommye Standard, PA-C    Other Instructions  ZIO XT- Long Term Monitor Instructions  Your physician has requested you wear a ZIO patch monitor for  7 days.  This is a single patch monitor. [employer] supplies one patch monitor per enrollment.  Additional stickers are not available. Please do not apply patch if you will be having a Nuclear Stress Test,  Echocardiogram, Cardiac CT, MRI, or Chest Xray during the period you would be wearing the  monitor. The patch cannot be worn during these tests. You cannot remove and re-apply the  ZIO XT patch monitor.  Your ZIO patch monitor will be mailed 3 day USPS to your address on file. It may take 3-5 days  to receive your monitor after you have been enrolled.  Once you have received your monitor, please review the enclosed instructions. Your monitor  has already been registered assigning a specific monitor serial # to you.  Billing and Patient Assistance Program Information  We have supplied [employer] with any of your insurance information on file for billing purposes. [employer] offers a sliding scale Patient Assistance Program for patients that do not have  insurance, or whose insurance does not completely cover the cost of the ZIO monitor.  You must apply for the Patient Assistance Program to qualify for this discounted rate.  To apply, please call [employer] at [phone], select option 4, select option 2, ask to apply for  Patient Assistance Program. Theodore Demark will ask your household income, and how many people  are in your household. They will quote your out-of-pocket cost based on that information.  [employer] will also be able to set up a 68-month interest-free payment plan if needed.  Applying the monitor   Shave hair from upper left chest.  Hold abrader disc by orange tab. Rub abrader in 40 strokes over the upper left chest as  indicated in your monitor instructions.  Clean area with 4 enclosed alcohol pads. Let dry.  Apply patch as indicated in monitor instructions. Patch will be placed under collarbone on left  side of chest with arrow pointing upward.  Rub patch adhesive wings for 2 minutes. Remove white label marked "1". Remove the white  label marked "2". Rub patch adhesive wings  for 2 additional minutes.  While looking in a mirror, press and release button in center of patch. A small green light will  flash 3-4 times. This will be your only indicator that the monitor has been turned on.  Do not shower for the first 24 hours. You may shower after the first 24 hours.  Press the button if you feel a symptom. You will hear a small click. Record Date, Time and  Symptom in the Patient Logbook.  When you are ready to remove the patch, follow instructions on the last 2 pages of Patient  Logbook. Stick patch monitor onto the last page of Patient Logbook.  Place Patient Logbook in the blue and white box. Use locking tab on box and tape box closed  securely. The blue and white box has prepaid postage on it. Please place it in the mailbox as  soon as possible. Your physician should have your test results approximately 7 days after the  monitor has [facility]  Call [place] [phone] you have questions regarding  your ZIO XT patch monitor. Call them immediately if you see an orange light blinking on your  monitor.  If your monitor falls off in less than  4 days, contact our Monitor department at [phone].  If your monitor becomes loose or falls off after 4 days call [employer] at [phone] for  suggestions on securing your monitor   Important Information About [place]

## 2022-01-19 NOTE — Progress Notes (Unsigned)
ZIO XT mailed to pt's home address. 

## 2022-01-20 LAB — CBC
Hematocrit: 42.9 % (ref 37.5–51.0)
Hemoglobin: 14.4 g/dL (ref 13.0–17.7)
MCH: 27.6 pg (ref 26.6–33.0)
MCHC: 33.6 g/dL (ref 31.5–35.7)
MCV: 82 fL (ref 79–97)
Platelets: 207 10*3/uL (ref 150–450)
RBC: 5.22 x10E6/uL (ref 4.14–5.80)
RDW: 13.1 % (ref 11.6–15.4)
WBC: 6.8 10*3/uL (ref 3.4–10.8)

## 2022-01-20 LAB — BASIC METABOLIC PANEL
BUN/Creatinine Ratio: 18 (ref 10–24)
BUN: 17 mg/dL (ref 8–27)
CO2: 27 mmol/L (ref 20–29)
Calcium: 10.2 mg/dL (ref 8.6–10.2)
Chloride: 99 mmol/L (ref 96–106)
Creatinine, Ser: 0.96 mg/dL (ref 0.76–1.27)
Glucose: 107 mg/dL — ABNORMAL HIGH (ref 70–99)
Potassium: 3.7 mmol/L (ref 3.5–5.2)
Sodium: 139 mmol/L (ref 134–144)
eGFR: 84 mL/min/{1.73_m2} (ref >59)

## 2022-01-22 DIAGNOSIS — R001 Bradycardia, unspecified: Secondary | ICD-10-CM | POA: Diagnosis not present

## 2022-01-22 DIAGNOSIS — R5383 Other fatigue: Secondary | ICD-10-CM | POA: Diagnosis not present

## 2022-02-03 ENCOUNTER — Ambulatory Visit (HOSPITAL_COMMUNITY): Payer: Medicare Other | Attending: Cardiovascular Disease

## 2022-02-03 DIAGNOSIS — R5383 Other fatigue: Secondary | ICD-10-CM | POA: Insufficient documentation

## 2022-02-03 DIAGNOSIS — R0602 Shortness of breath: Secondary | ICD-10-CM | POA: Insufficient documentation

## 2022-02-03 LAB — ECHOCARDIOGRAM COMPLETE
Area-P 1/2: 2.94 cm2
S' Lateral: 3.8 cm

## 2022-02-16 ENCOUNTER — Encounter (HOSPITAL_COMMUNITY): Payer: Self-pay | Admitting: Internal Medicine

## 2022-02-17 ENCOUNTER — Encounter (HOSPITAL_COMMUNITY)
Admission: RE | Disposition: A | Discharge status: Home / Self Care | Payer: Self-pay | Source: Home / Self Care | Attending: Internal Medicine

## 2022-02-17 ENCOUNTER — Other Ambulatory Visit: Payer: Self-pay

## 2022-02-17 ENCOUNTER — Ambulatory Visit (HOSPITAL_BASED_OUTPATIENT_CLINIC_OR_DEPARTMENT_OTHER)
Admission: RE | Admit: 2022-02-17 | Discharge: 2022-02-17 | Disposition: A | Discharge status: Home / Self Care | Payer: Medicare Other | Source: Ambulatory Visit | Attending: Internal Medicine | Admitting: Internal Medicine

## 2022-02-17 ENCOUNTER — Encounter (HOSPITAL_COMMUNITY): Payer: Self-pay | Admitting: Internal Medicine

## 2022-02-17 ENCOUNTER — Ambulatory Visit (HOSPITAL_BASED_OUTPATIENT_CLINIC_OR_DEPARTMENT_OTHER): Payer: Medicare Other | Admitting: Certified Registered"

## 2022-02-17 ENCOUNTER — Ambulatory Visit (HOSPITAL_COMMUNITY): Payer: Medicare Other | Admitting: Certified Registered"

## 2022-02-17 ENCOUNTER — Ambulatory Visit (HOSPITAL_COMMUNITY)
Admission: RE | Admit: 2022-02-17 | Discharge: 2022-02-17 | Disposition: A | Discharge status: Home / Self Care | Payer: Medicare Other | Attending: Internal Medicine | Admitting: Internal Medicine

## 2022-02-17 DIAGNOSIS — Z7901 Long term (current) use of anticoagulants: Secondary | ICD-10-CM | POA: Diagnosis not present

## 2022-02-17 DIAGNOSIS — I119 Hypertensive heart disease without heart failure: Secondary | ICD-10-CM | POA: Insufficient documentation

## 2022-02-17 DIAGNOSIS — I340 Nonrheumatic mitral (valve) insufficiency: Secondary | ICD-10-CM | POA: Diagnosis not present

## 2022-02-17 DIAGNOSIS — R0602 Shortness of breath: Secondary | ICD-10-CM | POA: Diagnosis not present

## 2022-02-17 DIAGNOSIS — I480 Paroxysmal atrial fibrillation: Secondary | ICD-10-CM

## 2022-02-17 DIAGNOSIS — I4821 Permanent atrial fibrillation: Secondary | ICD-10-CM | POA: Diagnosis not present

## 2022-02-17 DIAGNOSIS — R5383 Other fatigue: Secondary | ICD-10-CM | POA: Diagnosis not present

## 2022-02-17 DIAGNOSIS — M1990 Unspecified osteoarthritis, unspecified site: Secondary | ICD-10-CM | POA: Diagnosis not present

## 2022-02-17 DIAGNOSIS — I2510 Atherosclerotic heart disease of native coronary artery without angina pectoris: Secondary | ICD-10-CM | POA: Diagnosis not present

## 2022-02-17 HISTORY — PX: TEE WITHOUT CARDIOVERSION: SHX5443

## 2022-02-17 LAB — ECHO TEE
MV M vel: 3.26 m/s
MV Peak grad: 42.5 mmHg
Radius: 0.7 cm

## 2022-02-17 SURGERY — ECHOCARDIOGRAM, TRANSESOPHAGEAL
Anesthesia: Monitor Anesthesia Care

## 2022-02-17 MED ORDER — LACTATED RINGERS IV SOLN: INTRAVENOUS | Status: DC

## 2022-02-17 MED ORDER — LIDOCAINE 2% (20 MG/ML) 5 ML SYRINGE
INTRAMUSCULAR | Status: DC | PRN
  Administered 2022-02-17: 60 mg via INTRAVENOUS

## 2022-02-17 MED ORDER — BUTAMBEN-TETRACAINE-BENZOCAINE 2-2-14 % EX AERO
INHALATION_SPRAY | CUTANEOUS | Status: DC | PRN
  Administered 2022-02-17: 2 via TOPICAL

## 2022-02-17 MED ORDER — PROPOFOL 10 MG/ML IV BOLUS
INTRAVENOUS | Status: DC | PRN
  Administered 2022-02-17: 50 mg via INTRAVENOUS

## 2022-02-17 MED ORDER — PROPOFOL 500 MG/50ML IV EMUL
INTRAVENOUS | Status: DC | PRN
  Administered 2022-02-17: 100 ug/kg/min via INTRAVENOUS

## 2022-02-17 MED ORDER — SODIUM CHLORIDE 0.9 % IV SOLN: INTRAVENOUS | Status: DC

## 2022-02-17 MED ORDER — BUTAMBEN-TETRACAINE-BENZOCAINE 2-2-14 % EX AERO
INHALATION_SPRAY | CUTANEOUS | Status: AC
  Filled 2022-02-17: qty 20

## 2022-02-17 NOTE — Progress Notes (Signed)
  Echocardiogram Echocardiogram Transesophageal has been performed.  Douglas Waters 02/17/2022, 10:30 AM

## 2022-02-17 NOTE — Anesthesia Preprocedure Evaluation (Signed)
Anesthesia Evaluation  Patient identified by MRN, date of birth, ID band Patient awake    Reviewed: Allergy & Precautions, NPO status , Patient's Chart, lab work & pertinent test results  Airway Mallampati: II       Dental   Pulmonary    breath sounds clear to auscultation       Cardiovascular + CAD   Rhythm:Regular Rate:Normal     Neuro/Psych    GI/Hepatic negative GI ROS, Neg liver ROS,   Endo/Other  negative endocrine ROS  Renal/GU negative Renal ROS     Musculoskeletal  (+) Arthritis ,   Abdominal   Peds  Hematology   Anesthesia Other Findings   Reproductive/Obstetrics                             Anesthesia Physical Anesthesia Plan  ASA: 3  Anesthesia Plan: MAC   Post-op Pain Management:    Induction: Intravenous  PONV Risk Score and Plan: Treatment may vary due to age or medical condition and Propofol infusion  Airway Management Planned: Nasal Cannula and Simple Face Mask  Additional Equipment:   Intra-op Plan:   Post-operative Plan:   Informed Consent: I have reviewed the patients History and Physical, chart, labs and discussed the procedure including the risks, benefits and alternatives for the proposed anesthesia with the patient or authorized representative who has indicated his/her understanding and acceptance.     Dental advisory given  Plan Discussed with: CRNA and Anesthesiologist  Anesthesia Plan Comments:         Anesthesia Quick Evaluation

## 2022-02-17 NOTE — Interval H&P Note (Signed)
History and Physical Interval Note:  02/17/2022 8:27 AM  Douglas Waters  has presented today for surgery, with the diagnosis of SHORTNESS OF BREATH AND FATIQUE.  The various methods of treatment have been discussed with the patient and family. After consideration of risks, benefits and other options for treatment, the patient has consented to  Procedure(s): TRANSESOPHAGEAL ECHOCARDIOGRAM (TEE) (N/A) as a surgical intervention.  The patient's history has been reviewed, patient examined, no change in status, stable for surgery.  I have reviewed the patient's chart and labs.  Questions were answered to the patient's satisfaction.     Pixie Casino

## 2022-02-17 NOTE — Discharge Instructions (Signed)

## 2022-02-17 NOTE — Transfer of Care (Signed)
Immediate Anesthesia Transfer of Care Note  Patient: Douglas Waters  Procedure(s) Performed: TRANSESOPHAGEAL ECHOCARDIOGRAM (TEE)  Patient Location: Endoscopy Unit  Anesthesia Type:MAC  Level of Consciousness: awake, alert  and oriented  Airway & Oxygen Therapy: Patient Spontanous Breathing and Patient connected to nasal cannula oxygen  Post-op Assessment: Report given to RN and Post -op Vital signs reviewed and stable  Post vital signs: Reviewed and stable  Last Vitals:  Vitals Value Taken Time  BP 117/78 02/17/22 1025  Temp    Pulse 56 02/17/22 1027  Resp 17 02/17/22 1027  SpO2 95 % 02/17/22 1027  Vitals shown include unvalidated device data.  Last Pain:  Vitals:   02/17/22 0810  TempSrc: Oral  PainSc: 6          Complications: No notable events documented.

## 2022-02-17 NOTE — Anesthesia Postprocedure Evaluation (Signed)
Anesthesia Post Note  Patient: Douglas Waters [facility]  Procedure(s) Performed: TRANSESOPHAGEAL ECHOCARDIOGRAM (TEE)     Anesthesia Post Evaluation No notable events documented.  Last Vitals:  Vitals:   02/17/22 1030 02/17/22 1040  BP: 122/82 112/75  Pulse: 63 61  Resp: 13 20  Temp:    SpO2: 98% 100%    Last Pain:  Vitals:   02/17/22 1025  TempSrc: Temporal  PainSc:                  Dequandre Cordova
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
## 2022-02-17 NOTE — Anesthesia Procedure Notes (Signed)
Procedure Name: MAC Date/Time: 02/17/2022 9:35 AM  Performed by: Anastasio Auerbach, CRNAPre-anesthesia Checklist: Emergency Drugs available, Suction available and Patient being monitored Oxygen Delivery Method: Nasal cannula Induction Type: IV induction Airway Equipment and Method: Bite block

## 2022-02-17 NOTE — Anesthesia Postprocedure Evaluation (Signed)
Anesthesia Post Note  Patient: Douglas Waters  Procedure(s) Performed: TRANSESOPHAGEAL ECHOCARDIOGRAM (TEE)     Patient location during evaluation: Endoscopy Anesthesia Type: MAC Level of consciousness: awake Pain management: satisfactory to patient Vital Signs Assessment: post-procedure vital signs reviewed and stable Respiratory status: spontaneous breathing Cardiovascular status: stable Postop Assessment: no apparent nausea or vomiting Anesthetic complications: no   No notable events documented.  Last Vitals:  Vitals:   02/17/22 1030 02/17/22 1040  BP: 122/82 112/75  Pulse: 63 61  Resp: 13 20  Temp:    SpO2: 98% 100%    Last Pain:  Vitals:   02/17/22 1025  TempSrc: Temporal  PainSc:                  Cung Masterson
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
## 2022-02-19 ENCOUNTER — Ambulatory Visit: Payer: Medicare Other | Attending: Physician Assistant | Admitting: Physician Assistant

## 2022-02-19 ENCOUNTER — Encounter: Payer: Self-pay | Admitting: *Deleted

## 2022-02-19 ENCOUNTER — Encounter: Payer: Self-pay | Admitting: Physician Assistant

## 2022-02-19 VITALS — BP 140/72 | HR 48 | Ht 72.0 in | Wt 224.0 lb

## 2022-02-19 DIAGNOSIS — I340 Nonrheumatic mitral (valve) insufficiency: Secondary | ICD-10-CM

## 2022-02-19 DIAGNOSIS — I4821 Permanent atrial fibrillation: Secondary | ICD-10-CM

## 2022-02-19 DIAGNOSIS — R0602 Shortness of breath: Secondary | ICD-10-CM | POA: Diagnosis not present

## 2022-02-19 NOTE — Patient Instructions (Addendum)
Medication Instructions:   Your physician recommends that you continue on your current medications as directed. Please refer to the Current Medication list given to you today.   (MAKE SURE YOU HOLD YOUR XARELTO 2 DAY PRIOR TO SCHEDULED PROCEDURE  ON 02-22-22 '@9'$ :30AM ARRIVE AT 7 AM    *If you need a refill on your cardiac medications before your next appointment, please call your pharmacy*   Lab Work:  BMET AND CBC TODAY   If you have labs (blood work) drawn today and your tests are completely normal, you will receive your results only by: [place] (if you have MyChart) OR A paper copy in the mail If you have any lab test that is abnormal or we need to change your treatment, we will call you to review the results.   Testing/Procedures:  Your physician has requested that you have a cardiac catheterization. Cardiac catheterization is used to diagnose and/or treat various heart conditions. Doctors may recommend this procedure for a number of different reasons. The most common reason is to evaluate chest pain. Chest pain can be a symptom of coronary artery disease (CAD), and cardiac catheterization can show whether plaque is narrowing or blocking your heart's arteries. This procedure is also used to evaluate the valves, as well as measure the blood flow and oxygen levels in different parts of your heart. For further information please visit [email or web address]. Please follow instruction sheet, as given.    Follow-Up: At [facility], you and your health needs are our priority.  As part of our continuing mission to provide you with exceptional heart care, we have created designated Provider Care Teams.  These Care Teams include your primary Cardiologist (physician) and Advanced Practice Providers (APPs -  Physician Assistants and Nurse Practitioners) who all work together to provide you with the care you need, when you need it.  We recommend signing up for the patient portal called  "MyChart".  Sign up information is provided on this After Visit Summary.  MyChart is used to connect with patients for Virtual Visits (Telemedicine).  Patients are able to view lab/test results, encounter notes, upcoming appointments, etc.  Non-urgent messages can be sent to your provider as well.   To learn more about what you can do with MyChart, go to [email or web address].    Your next appointment:   1 month(s)    POST CATH    The format for your next appointment:   In Person  Provider:   Cristopher Peru, MD    Other Instructions   Important Information About Sugar

## 2022-02-20 LAB — BASIC METABOLIC PANEL
BUN/Creatinine Ratio: 17 (ref 10–24)
BUN: 15 mg/dL (ref 8–27)
CO2: 26 mmol/L (ref 20–29)
Calcium: 9.8 mg/dL (ref 8.6–10.2)
Chloride: 101 mmol/L (ref 96–106)
Creatinine, Ser: 0.88 mg/dL (ref 0.76–1.27)
Glucose: 95 mg/dL (ref 70–99)
Potassium: 4 mmol/L (ref 3.5–5.2)
Sodium: 141 mmol/L (ref 134–144)
eGFR: 91 mL/min/{1.73_m2} (ref >59)

## 2022-02-20 LAB — CBC
Hematocrit: 44.1 % (ref 37.5–51.0)
Hemoglobin: 14.6 g/dL (ref 13.0–17.7)
MCH: 27.4 pg (ref 26.6–33.0)
MCHC: 33.1 g/dL (ref 31.5–35.7)
MCV: 83 fL (ref 79–97)
Platelets: 227 10*3/uL (ref 150–450)
RBC: 5.33 x10E6/uL (ref 4.14–5.80)
RDW: 13.1 % (ref 11.6–15.4)
WBC: 6.8 10*3/uL (ref 3.4–10.8)

## 2022-02-22 ENCOUNTER — Encounter (HOSPITAL_COMMUNITY)
Admission: RE | Disposition: A | Discharge status: Home / Self Care | Payer: Self-pay | Source: Home / Self Care | Attending: Internal Medicine

## 2022-02-22 ENCOUNTER — Encounter (HOSPITAL_COMMUNITY): Payer: Self-pay | Admitting: Internal Medicine

## 2022-02-22 ENCOUNTER — Ambulatory Visit (HOSPITAL_COMMUNITY)
Admission: RE | Admit: 2022-02-22 | Discharge: 2022-02-22 | Disposition: A | Discharge status: Home / Self Care | Payer: Medicare Other | Attending: Internal Medicine | Admitting: Internal Medicine

## 2022-02-22 ENCOUNTER — Other Ambulatory Visit: Payer: Self-pay

## 2022-02-22 DIAGNOSIS — I272 Pulmonary hypertension, unspecified: Secondary | ICD-10-CM | POA: Insufficient documentation

## 2022-02-22 DIAGNOSIS — Z7901 Long term (current) use of anticoagulants: Secondary | ICD-10-CM | POA: Insufficient documentation

## 2022-02-22 DIAGNOSIS — R5383 Other fatigue: Secondary | ICD-10-CM | POA: Insufficient documentation

## 2022-02-22 DIAGNOSIS — I340 Nonrheumatic mitral (valve) insufficiency: Secondary | ICD-10-CM | POA: Diagnosis not present

## 2022-02-22 DIAGNOSIS — I4821 Permanent atrial fibrillation: Secondary | ICD-10-CM | POA: Diagnosis not present

## 2022-02-22 DIAGNOSIS — R0602 Shortness of breath: Secondary | ICD-10-CM

## 2022-02-22 HISTORY — PX: RIGHT HEART CATH AND CORONARY ANGIOGRAPHY: CATH118264

## 2022-02-22 LAB — POCT I-STAT EG7
Acid-Base Excess: 3 mmol/L — ABNORMAL HIGH (ref 0.0–2.0)
Acid-Base Excess: 4 mmol/L — ABNORMAL HIGH (ref 0.0–2.0)
Bicarbonate: 28.7 mmol/L — ABNORMAL HIGH (ref 20.0–28.0)
Bicarbonate: 29.6 mmol/L — ABNORMAL HIGH (ref 20.0–28.0)
Calcium, Ion: 1.23 mmol/L (ref 1.15–1.40)
Calcium, Ion: 1.26 mmol/L (ref 1.15–1.40)
HCT: 40 % (ref 39.0–52.0)
HCT: 41 % (ref 39.0–52.0)
Hemoglobin: 13.6 g/dL (ref 13.0–17.0)
Hemoglobin: 13.9 g/dL (ref 13.0–17.0)
O2 Saturation: 69 %
O2 Saturation: 70 %
Potassium: 3.5 mmol/L (ref 3.5–5.1)
Potassium: 3.6 mmol/L (ref 3.5–5.1)
Sodium: 140 mmol/L (ref 135–145)
Sodium: 140 mmol/L (ref 135–145)
TCO2: 30 mmol/L (ref 22–32)
TCO2: 31 mmol/L (ref 22–32)
pCO2, Ven: 46.8 mmHg (ref 44–60)
pCO2, Ven: 47.9 mmHg (ref 44–60)
pH, Ven: 7.396 (ref 7.25–7.43)
pH, Ven: 7.399 (ref 7.25–7.43)
pO2, Ven: 36 mmHg (ref 32–45)
pO2, Ven: 37 mmHg (ref 32–45)

## 2022-02-22 LAB — POCT I-STAT 7, (LYTES, BLD GAS, ICA,H+H)
Acid-Base Excess: 2 mmol/L (ref 0.0–2.0)
Bicarbonate: 26.8 mmol/L (ref 20.0–28.0)
Calcium, Ion: 1.16 mmol/L (ref 1.15–1.40)
HCT: 38 % — ABNORMAL LOW (ref 39.0–52.0)
Hemoglobin: 12.9 g/dL — ABNORMAL LOW (ref 13.0–17.0)
O2 Saturation: 96 %
Potassium: 3.5 mmol/L (ref 3.5–5.1)
Sodium: 141 mmol/L (ref 135–145)
TCO2: 28 mmol/L (ref 22–32)
pCO2 arterial: 39.7 mmHg (ref 32–48)
pH, Arterial: 7.437 (ref 7.35–7.45)
pO2, Arterial: 78 mmHg — ABNORMAL LOW (ref 83–108)

## 2022-02-22 SURGERY — RIGHT HEART CATH AND CORONARY ANGIOGRAPHY
Anesthesia: LOCAL

## 2022-02-22 MED ORDER — LABETALOL HCL 5 MG/ML IV SOLN: 10.0000 mg | INTRAVENOUS | Status: DC | PRN

## 2022-02-22 MED ORDER — HEPARIN (PORCINE) IN NACL 1000-0.9 UT/500ML-% IV SOLN
INTRAVENOUS | Status: AC
Start: 2022-02-22 — End: ?
  Filled 2022-02-22: qty 1000

## 2022-02-22 MED ORDER — SODIUM CHLORIDE 0.9 % IV SOLN: 250.0000 mL | INTRAVENOUS | Status: DC | PRN

## 2022-02-22 MED ORDER — HEPARIN (PORCINE) IN NACL 1000-0.9 UT/500ML-% IV SOLN
INTRAVENOUS | Status: DC | PRN
  Administered 2022-02-22 (×2): 500 mL

## 2022-02-22 MED ORDER — ONDANSETRON HCL 4 MG/2ML IJ SOLN: 4.0000 mg | Freq: Four times a day (QID) | INTRAMUSCULAR | Status: DC | PRN

## 2022-02-22 MED ORDER — FENTANYL CITRATE (PF) 100 MCG/2ML IJ SOLN
INTRAMUSCULAR | Status: DC | PRN
  Administered 2022-02-22 (×2): 25 ug via INTRAVENOUS

## 2022-02-22 MED ORDER — SODIUM CHLORIDE 0.9% FLUSH: 3.0000 mL | INTRAVENOUS | Status: DC | PRN

## 2022-02-22 MED ORDER — VERAPAMIL HCL 2.5 MG/ML IV SOLN
INTRAVENOUS | Status: DC | PRN
  Administered 2022-02-22: 10 mL via INTRA_ARTERIAL

## 2022-02-22 MED ORDER — SODIUM CHLORIDE 0.9% FLUSH: 3.0000 mL | Freq: Two times a day (BID) | INTRAVENOUS | Status: DC

## 2022-02-22 MED ORDER — LIDOCAINE HCL (PF) 1 % IJ SOLN
INTRAMUSCULAR | Status: DC | PRN
  Administered 2022-02-22 (×2): 2 mL

## 2022-02-22 MED ORDER — HEPARIN SODIUM (PORCINE) 1000 UNIT/ML IJ SOLN
INTRAMUSCULAR | Status: AC
  Filled 2022-02-22: qty 10

## 2022-02-22 MED ORDER — ACETAMINOPHEN 325 MG PO TABS: 650.0000 mg | ORAL_TABLET | ORAL | Status: DC | PRN

## 2022-02-22 MED ORDER — MIDAZOLAM HCL 2 MG/2ML IJ SOLN
INTRAMUSCULAR | Status: AC
  Filled 2022-02-22: qty 2

## 2022-02-22 MED ORDER — SODIUM CHLORIDE 0.9 % IV SOLN: INTRAVENOUS | Status: DC

## 2022-02-22 MED ORDER — HYDRALAZINE HCL 20 MG/ML IJ SOLN: 10.0000 mg | INTRAMUSCULAR | Status: DC | PRN

## 2022-02-22 MED ORDER — ASPIRIN 81 MG PO CHEW: 81.0000 mg | CHEWABLE_TABLET | ORAL | Status: DC

## 2022-02-22 MED ORDER — HEPARIN SODIUM (PORCINE) 1000 UNIT/ML IJ SOLN
INTRAMUSCULAR | Status: DC | PRN
  Administered 2022-02-22: 5000 [IU] via INTRAVENOUS

## 2022-02-22 MED ORDER — SODIUM CHLORIDE 0.9 % WEIGHT BASED INFUSION
3.0000 mL/kg/h | INTRAVENOUS | Status: AC
  Administered 2022-02-22: 3 mL/kg/h via INTRAVENOUS

## 2022-02-22 MED ORDER — IOHEXOL 350 MG/ML SOLN
INTRAVENOUS | Status: DC | PRN
  Administered 2022-02-22: 35 mL

## 2022-02-22 MED ORDER — VERAPAMIL HCL 2.5 MG/ML IV SOLN
INTRAVENOUS | Status: AC
  Filled 2022-02-22: qty 2

## 2022-02-22 MED ORDER — SODIUM CHLORIDE 0.9 % WEIGHT BASED INFUSION: 1.0000 mL/kg/h | INTRAVENOUS | Status: DC

## 2022-02-22 MED ORDER — FENTANYL CITRATE (PF) 100 MCG/2ML IJ SOLN
INTRAMUSCULAR | Status: AC
  Filled 2022-02-22: qty 2

## 2022-02-22 MED ORDER — LIDOCAINE HCL (PF) 1 % IJ SOLN
INTRAMUSCULAR | Status: AC
Start: 2022-02-22 — End: ?
  Filled 2022-02-22: qty 30

## 2022-02-22 MED ORDER — MIDAZOLAM HCL 2 MG/2ML IJ SOLN
INTRAMUSCULAR | Status: DC | PRN
  Administered 2022-02-22 (×2): 1 mg via INTRAVENOUS

## 2022-02-22 SURGICAL SUPPLY — 13 items
BAND ZEPHYR COMPRESS 30 LONG (HEMOSTASIS) IMPLANT
CATH BALLN WEDGE 5F 110CM (CATHETERS) IMPLANT
CATH INFINITI 5FR ANG PIGTAIL (CATHETERS) IMPLANT
CATH OPTITORQUE TIG 4.0 5F (CATHETERS) IMPLANT
GLIDESHEATH SLEND A-KIT 6F 22G (SHEATH) IMPLANT
GLIDESHEATH SLEND SS 6F .021 (SHEATH) IMPLANT
GUIDEWIRE INQWIRE 1.5J.035X260 (WIRE) IMPLANT
INQWIRE 1.5J .035X260CM (WIRE) ×1
KIT HEART LEFT (KITS) ×1 IMPLANT
PACK CARDIAC CATHETERIZATION (CUSTOM PROCEDURE TRAY) ×1 IMPLANT
SHEATH GLIDE SLENDER 4/5FR (SHEATH) IMPLANT
TRANSDUCER W/STOPCOCK (MISCELLANEOUS) ×1 IMPLANT
TUBING CIL FLEX 10 FLL-RA (TUBING) ×1 IMPLANT

## 2022-02-22 NOTE — Progress Notes (Signed)
TR BAND REMOVAL  LOCATION:    right radial  DEFLATED PER PROTOCOL:    Yes.    TIME BAND OFF / DRESSING APPLIED: 02/22/22 at [place] ARRIVAL:    Level 0  SITE AFTER BAND REMOVAL:    Level 0  CIRCULATION SENSATION AND MOVEMENT:    Within Normal Limits   Yes.    COMMENTS:

## 2022-02-22 NOTE — Interval H&P Note (Signed)
History and Physical Interval Note:  02/22/2022 8:41 AM  Douglas Waters  has presented today for surgery, with the diagnosis of shortness of breath and mitral regurgitation.  The various methods of treatment have been discussed with the patient and family. After consideration of risks, benefits and other options for treatment, the patient has consented to  Procedure(s): RIGHT/LEFT HEART CATH AND CORONARY ANGIOGRAPHY (N/A) as a surgical intervention.  The patient's history has been reviewed, patient examined, no change in status, stable for surgery.  I have reviewed the patient's chart and labs.  Questions were answered to the patient's satisfaction.    Cath Lab Visit (complete for each Cath Lab visit)  Clinical Evaluation Leading to the Procedure:   ACS: No.  Non-ACS:    Anginal/NYHA Classification: NYHA class III  Anti-ischemic medical therapy: Minimal Therapy (1 class of medications)  Non-Invasive Test Results: No non-invasive testing performed  Prior CABG: No previous CABG  Iysha Mishkin

## 2022-02-25 ENCOUNTER — Other Ambulatory Visit: Payer: Self-pay | Admitting: *Deleted

## 2022-02-25 DIAGNOSIS — I340 Nonrheumatic mitral (valve) insufficiency: Secondary | ICD-10-CM

## 2022-03-12 ENCOUNTER — Ambulatory Visit: Payer: Medicare Other | Attending: Physician Assistant | Admitting: Cardiovascular Disease

## 2022-03-12 ENCOUNTER — Telehealth: Payer: Self-pay | Admitting: Cardiovascular Disease

## 2022-03-12 DIAGNOSIS — R400 Somnolence: Secondary | ICD-10-CM

## 2022-03-12 DIAGNOSIS — G4731 Primary central sleep apnea: Secondary | ICD-10-CM | POA: Diagnosis not present

## 2022-03-12 DIAGNOSIS — I4821 Permanent atrial fibrillation: Secondary | ICD-10-CM | POA: Insufficient documentation

## 2022-03-12 DIAGNOSIS — G4733 Obstructive sleep apnea (adult) (pediatric): Secondary | ICD-10-CM | POA: Diagnosis present

## 2022-03-12 NOTE — Telephone Encounter (Signed)
Patient stated he has a sleep study scheduled for tomorrow and he will need further instructions.

## 2022-03-12 NOTE — Telephone Encounter (Signed)
Returned a call to the patient . No answer. Left message for him to contact the sleep lab for questions relating to instructions. Contact information for [facility] provided to the patient.

## 2022-03-15 ENCOUNTER — Telehealth: Payer: Self-pay | Admitting: Internal Medicine

## 2022-03-15 NOTE — Telephone Encounter (Signed)
Pt c/o of Chest Pain: STAT if CP now or developed within 24 hours  1. Are you having CP right now?   No  2. Are you experiencing any other symptoms (ex. SOB, nausea, vomiting, sweating)?   No  3. How long have you been experiencing CP?  Has happened when patient really tries to do anything   4. Is your CP continuous or coming and going?  Comes and goes  5. Have you taken Nitroglycerin?   No  Wife stated patient has a leaky valve and is being scheduled for surgery but patient is currently having headaches and pressure in his  chest. ?

## 2022-03-15 NOTE — Telephone Encounter (Signed)
Spoke with patient who reports feeling increased SOB, chest pressure and fatigue follow working outside. Patient states he was working on building a chicken house 2 days ago. Yesterday he felt tired, had sob and chest pressure but is improving today.  Patient has mitral regurgitation and is scheduled for consult with Dr. Lanice Schwab on 03/29/22. Advised patient that with his mitral valve regurgitation he may be overworking and needs to take it easy on participating in strenuous activity. Offered to get patient appointment with a provider this week but he declined and wants to see how he feels after resting today.  Advised patient to rest today. If he continues to feel sob or experience chest pain after resting to call back for an appointment.  Advised if he becomes extremely sob or has new onset of chest pain, N/V to go the ED or call EMS. Patient verbalized understanding.

## 2022-03-17 ENCOUNTER — Telehealth: Payer: Self-pay | Admitting: *Deleted

## 2022-03-17 NOTE — Telephone Encounter (Signed)
Pt worked on his chicken house Friday.  Had sleep study Friday night.  Saturday felt bad, couldn't do anything but Sunday was worse.  "I thought I was gonna meet my maker".  Called in Monday reporting the chest pressure.    He has an appointment 10/16 with Dr. Lavonna Monarch and 10/19 with Dr. Lovena Le.  He is requesting to be placed on wait list to move his appointments up because the chest pressure seems to be happening more frequently.  No chest fluttering or pounding heart beat.  No reported SOB but fatigues easily.  Getting the pressure with no real exertion - just walking to his car/out of pain doctors office today.    I adv I would send a message to Dr. Lovena Le to let him know and we can reach out to him if there is any sooner availability for appointments.  Adv to report to the ED if he notes worsening of the chest pressure/if accompanied by any other symptoms.

## 2022-03-18 ENCOUNTER — Other Ambulatory Visit: Payer: Medicare Other

## 2022-03-18 ENCOUNTER — Ambulatory Visit: Payer: Medicare Other | Admitting: Internal Medicine

## 2022-03-22 ENCOUNTER — Other Ambulatory Visit: Payer: Medicare Other

## 2022-03-22 DIAGNOSIS — N529 Male erectile dysfunction, unspecified: Secondary | ICD-10-CM

## 2022-03-22 DIAGNOSIS — E7800 Pure hypercholesterolemia, unspecified: Secondary | ICD-10-CM

## 2022-03-22 DIAGNOSIS — F419 Anxiety disorder, unspecified: Secondary | ICD-10-CM

## 2022-03-22 DIAGNOSIS — I10 Essential (primary) hypertension: Secondary | ICD-10-CM

## 2022-03-22 DIAGNOSIS — R7989 Other specified abnormal findings of blood chemistry: Secondary | ICD-10-CM

## 2022-03-22 DIAGNOSIS — R739 Hyperglycemia, unspecified: Secondary | ICD-10-CM

## 2022-03-22 NOTE — Telephone Encounter (Signed)
Discussed request with Dr. Lovena Le.   Dr. Lovena Le stated ok to work Pt in / move his appointment up if possible.   Pt called and told scheduling will reach out to him.

## 2022-03-23 ENCOUNTER — Ambulatory Visit: Payer: Medicare Other | Admitting: Internal Medicine

## 2022-03-23 LAB — CBC WITH DIFFERENTIAL/PLATELET
Absolute Monocytes: 522 cells/uL (ref 200–950)
Basophils Absolute: 48 cells/uL (ref 0–200)
Basophils Relative: 0.8 %
Eosinophils Absolute: 684 cells/uL — ABNORMAL HIGH (ref 15–500)
Eosinophils Relative: 11.4 %
HCT: 40.7 % (ref 38.5–50.0)
Hemoglobin: 13.8 g/dL (ref 13.2–17.1)
Lymphs Abs: 984 cells/uL (ref 850–3900)
MCH: 27.5 pg (ref 27.0–33.0)
MCHC: 33.9 g/dL (ref 32.0–36.0)
MCV: 81.2 fL (ref 80.0–100.0)
MPV: 10.3 fL (ref 7.5–12.5)
Monocytes Relative: 8.7 %
Neutro Abs: 3762 cells/uL (ref 1500–7800)
Neutrophils Relative %: 62.7 %
Platelets: 172 10*3/uL (ref 140–400)
RBC: 5.01 10*6/uL (ref 4.20–5.80)
RDW: 12.8 % (ref 11.0–15.0)
Total Lymphocyte: 16.4 %
WBC: 6 10*3/uL (ref 3.8–10.8)

## 2022-03-23 LAB — COMPLETE METABOLIC PANEL WITH GFR
AG Ratio: 1.7 (calc) (ref 1.0–2.5)
ALT: 10 U/L (ref 9–46)
AST: 14 U/L (ref 10–35)
Albumin: 4.3 g/dL (ref 3.6–5.1)
Alkaline phosphatase (APISO): 89 U/L (ref 35–144)
BUN: 14 mg/dL (ref 7–25)
CO2: 35 mmol/L — ABNORMAL HIGH (ref 20–32)
Calcium: 9.7 mg/dL (ref 8.6–10.3)
Chloride: 99 mmol/L (ref 98–110)
Creat: 0.79 mg/dL (ref 0.70–1.28)
Globulin: 2.5 g/dL (calc) (ref 1.9–3.7)
Glucose, Bld: 105 mg/dL — ABNORMAL HIGH (ref 65–99)
Potassium: 4.6 mmol/L (ref 3.5–5.3)
Sodium: 139 mmol/L (ref 135–146)
Total Bilirubin: 1.7 mg/dL — ABNORMAL HIGH (ref 0.2–1.2)
Total Protein: 6.8 g/dL (ref 6.1–8.1)
eGFR: 94 mL/min/{1.73_m2} (ref >=60)

## 2022-03-23 LAB — LIPID PANEL
Cholesterol: 122 mg/dL (ref <200)
HDL: 40 mg/dL (ref >=40)
LDL Cholesterol (Calc): 64 mg/dL (calc)
Non-HDL Cholesterol (Calc): 82 mg/dL (calc) (ref <130)
Total CHOL/HDL Ratio: 3.1 (calc) (ref <5.0)
Triglycerides: 98 mg/dL (ref <150)

## 2022-03-23 LAB — HEMOGLOBIN A1C
Hgb A1c MFr Bld: 5.5 % of total Hgb (ref <5.7)
Mean Plasma Glucose: 111 mg/dL
eAG (mmol/L): 6.2 mmol/L

## 2022-03-23 LAB — PSA: PSA: 1.44 ng/mL (ref <=4.00)

## 2022-03-26 ENCOUNTER — Encounter: Payer: Self-pay | Admitting: Cardiovascular Disease

## 2022-03-26 NOTE — Procedures (Signed)
[facility]        Patient Name: Douglas Waters, Douglas Waters Date: 03/12/2022 Gender: Male D.O.B: 1951-04-19 Age (years): 47 Referring Provider: Not Available Height (inches): 72 Interpreting Physician: Shelva Majestic MD, ABSM Weight (lbs): 224 RPSGT: Peak, Robert BMI: 30 MRN: [id number] Neck Size: <br>  CLINICAL INFORMATION Sleep Study Type: NPSG  Indication for sleep study: atrial fibrillation, snoring, fatigue  Epworth Sleepiness Score: N/A  SLEEP STUDY TECHNIQUE As per the AASM Manual for the Scoring of Sleep and Associated Events v2.3 (April 2016) with a hypopnea requiring 4% desaturations.  The channels recorded and monitored were frontal, central and occipital EEG, electrooculogram (EOG), submentalis EMG (chin), nasal and oral airflow, thoracic and abdominal wall motion, anterior tibialis EMG, snore microphone, electrocardiogram, and pulse oximetry.  MEDICATIONS amLODipine (NORVASC) 5 MG tablet buPROPion (WELLBUTRIN XL) 300 MG 24 hr tablet clonazePAM (KLONOPIN) 0.5 MG tablet fentaNYL (DURAGESIC - DOSED MCG/HR) 25 MCG/HR HYDROcodone-acetaminophen (NORCO) 10-325 MG tablet LORazepam (ATIVAN) 0.5 MG tablet OVER THE COUNTER MEDICATION polyethylene glycol (MIRALAX / GLYCOLAX) packet rivaroxaban (XARELTO) 20 MG TABS tab Medications self-administered by patient taken the night of the study : N/A  SLEEP ARCHITECTURE The study was initiated at 9:17:52 PM and ended at 4:35:58 AM.  Sleep onset time was 53.6 minutes and the sleep efficiency was 52.0%. The total sleep time was 228 minutes.  Stage REM latency was 101.5 minutes.  The patient spent 8.55% of the night in stage N1 sleep, 76.75% in stage N2 sleep, 1.54% in stage N3 and 13.2% in REM.  Alpha intrusion was absent.  Supine sleep was 2.19%.  RESPIRATORY PARAMETERS The overall apnea/hypopnea index (AHI) was 18.7 per hour. The respiratory disturbance insex (RDI was 18.7). There were 46 total apneas, including 0  obstructive, 46 central and 0 mixed apneas. There were 25 hypopneas and 0 RERAs.  The AHI during Stage REM sleep was 14.0 per hour.  AHI while supine was 96.0 per hour.  The mean oxygen saturation was 93.99%. The minimum SpO2 during sleep was 85.00%.  Moderate snoring was noted during this study.  CARDIAC DATA The 2 lead EKG demonstrated atrial fibrillation. The mean heart rate was 49.71 beats per minute. Other EKG findings include: Atrial fibrillation  LEG MOVEMENT DATA The total PLMS were 0 with a resulting PLMS index of 0.00. Associated arousal with leg movement index was 0.0 .  IMPRESSIONS - Moderate obstructive sleep apnea occurred during this study (AHI 18.7/h); however, sleep apnea was very severe with supine sleep (AHI 96.0/h). - Mild central sleep apnea occurred during this study (CAI = 12.1/h). - Mild oxygen desaturation to a nadir of 85%. - The patient snored with moderate snoring volume. - Cardiac abnormalities were noted during this study; atrial fibrillation - Clinically significant periodic limb movements did not occur during sleep. No significant associated arousals.  DIAGNOSIS - Obstructive Sleep Apnea (G47.33) - Central Sleep Apnea (G47.37)  RECOMMENDATIONS - CPAP titration to determine optimal pressure required to alleviate sleep disordered breathing. Recommend an in-lab titration with potential need for BiPAP or ASV titration to eliminate central sleep apnea. - Effort should be made to optimize nasal and oropharyngeal patency. - The patient should be counseled to avoid supine sleep; consider positional therapy to avoid supine position during sleep. - Avoid alcohol, sedatives and other CNS depressants that may worsen sleep apnea and disrupt normal sleep architecture. - Sleep hygiene should be reviewed to assess factors that may improve sleep quality. - Weight management (BMI 30) and regular exercise  should be initiated or continued if  appropriate.  [Electronically signed] 03/26/2022 01:01 PM  Shelva Majestic MD,FACC, ABSM Diplomate, American Board of Sleep Medicine  NPI: [id number]  [place] PH: [phone]   FX: [phone] [place]
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
## 2022-03-29 ENCOUNTER — Institutional Professional Consult (permissible substitution) (INDEPENDENT_AMBULATORY_CARE_PROVIDER_SITE_OTHER): Payer: Medicare Other | Admitting: Thoracic Surgery (Cardiothoracic Vascular Surgery)

## 2022-03-29 ENCOUNTER — Encounter: Payer: Self-pay | Admitting: Internal Medicine

## 2022-03-29 ENCOUNTER — Other Ambulatory Visit: Payer: Self-pay | Admitting: *Deleted

## 2022-03-29 ENCOUNTER — Other Ambulatory Visit: Payer: Self-pay | Admitting: Internal Medicine

## 2022-03-29 ENCOUNTER — Ambulatory Visit (INDEPENDENT_AMBULATORY_CARE_PROVIDER_SITE_OTHER): Payer: Medicare Other | Admitting: Internal Medicine

## 2022-03-29 ENCOUNTER — Encounter: Payer: Self-pay | Admitting: Thoracic Surgery (Cardiothoracic Vascular Surgery)

## 2022-03-29 VITALS — BP 110/70 | HR 83 | Temp 97.6°F | Ht 70.0 in | Wt 223.8 lb

## 2022-03-29 VITALS — BP 168/69 | HR 62 | Resp 18 | Ht 72.0 in | Wt 223.0 lb

## 2022-03-29 DIAGNOSIS — Z9889 Other specified postprocedural states: Secondary | ICD-10-CM | POA: Diagnosis not present

## 2022-03-29 DIAGNOSIS — I340 Nonrheumatic mitral (valve) insufficiency: Secondary | ICD-10-CM | POA: Diagnosis not present

## 2022-03-29 DIAGNOSIS — I480 Paroxysmal atrial fibrillation: Secondary | ICD-10-CM | POA: Diagnosis not present

## 2022-03-29 DIAGNOSIS — Z8659 Personal history of other mental and behavioral disorders: Secondary | ICD-10-CM | POA: Diagnosis not present

## 2022-03-29 DIAGNOSIS — Z7901 Long term (current) use of anticoagulants: Secondary | ICD-10-CM

## 2022-03-29 DIAGNOSIS — Q238 Other congenital malformations of aortic and mitral valves: Secondary | ICD-10-CM

## 2022-03-29 DIAGNOSIS — Z0000 Encounter for general adult medical examination without abnormal findings: Secondary | ICD-10-CM | POA: Diagnosis not present

## 2022-03-29 DIAGNOSIS — Z23 Encounter for immunization: Secondary | ICD-10-CM | POA: Diagnosis not present

## 2022-03-29 LAB — POCT URINALYSIS DIPSTICK
Bilirubin, UA: NEGATIVE
Blood, UA: NEGATIVE
Glucose, UA: NEGATIVE
Ketones, UA: NEGATIVE
Leukocytes, UA: NEGATIVE
Nitrite, UA: NEGATIVE
Protein, UA: NEGATIVE
Spec Grav, UA: 1.015 (ref 1.010–1.025)
Urobilinogen, UA: 0.2 E.U./dL
pH, UA: 5 (ref 5.0–8.0)

## 2022-03-29 MED ORDER — LORAZEPAM 0.5 MG PO TABS: 0.5000 mg | ORAL_TABLET | Freq: Two times a day (BID) | ORAL | 1 refills | Status: DC | PRN
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
## 2022-03-29 NOTE — Patient Instructions (Addendum)
Flu vaccine and pneumococcal 20 vaccine given today.  Lipid panel is within normal limits.  Patient may return here in 6 months for follow-up.  Lorazepam refilled as needed for anxiety.

## 2022-03-29 NOTE — Patient Instructions (Signed)
Stop xeralto five days prior to surgery

## 2022-03-29 NOTE — Telephone Encounter (Signed)
Prescription refill request for Xarelto received.  Indication:Afib Last office visit:9/23 Weight:99.8 kg Age:71 Scr:0.7 CrCl:134.65 ml/min  Prescription refilled

## 2022-03-30 ENCOUNTER — Encounter: Payer: Self-pay | Admitting: *Deleted

## 2022-04-01 ENCOUNTER — Ambulatory Visit: Payer: Medicare Other | Admitting: Internal Medicine

## 2022-04-02 DIAGNOSIS — Z0289 Encounter for other administrative examinations: Secondary | ICD-10-CM

## 2022-04-06 ENCOUNTER — Telehealth: Payer: Self-pay | Admitting: *Deleted

## 2022-04-06 NOTE — Telephone Encounter (Signed)
-----   Message from Troy Sine, MD sent at 03/26/2022  1:09 PM EDT ----- Mariann Laster, please notify pt and set up with in-lab titration particularly since both OSA and CSA; may need BiPAP of future ASV

## 2022-04-06 NOTE — Telephone Encounter (Signed)
Patient notified of sleep study results and recommendations. He informs me that he will be having "heart surgery" on this Friday 04/09/22. Dr Claiborne Billings will be notified for further recommendations.

## 2022-04-06 NOTE — Progress Notes (Signed)
Surgical Instructions    Your procedure is scheduled on Friday October 27th.  Report to [facility] Main Entrance "A" at 5:30 A.M., then check in with the Admitting office.  Call this number if you have problems the morning of surgery:  [phone]   If you have any questions prior to your surgery date call [phone]: Open Monday-Friday 8am-4pm If you experience any cold or flu symptoms such as cough, fever, chills, shortness of breath, etc. between now and your scheduled surgery, please notify us at the above number     Remember:  Do not eat or drink after midnight the night before your surgery      Take these medicines the morning of surgery with A SIP OF WATER: amLODipine (NORVASC) 5 MG tablet   IF NEEDED HYDROcodone-acetaminophen (NORCO) 10-325 MG tablet clonazePAM (KLONOPIN) 0.5 MG tablet   Follow your surgeon's instructions on when to stop Xarelto.  If no instructions were given by your surgeon then you will need to call the office to get those instructions.     As of today, STOP taking any Aspirin (unless otherwise instructed by your surgeon) Aleve, Naproxen, Ibuprofen, Motrin, Advil, Goody's, BC's, all herbal medications, fish oil, and all vitamins.           Do not wear jewelry . Do not wear lotions, powders, cologne or deodorant. Do not shave 48 hours prior to surgery.  Men may shave face and neck. Do not bring valuables to the hospital. Do not wear nail polish  [facility] is not responsible for any belongings or valuables.    Do NOT Smoke (Tobacco/Vaping)  24 hours prior to your procedure  If you use a CPAP at night, you may bring your mask for your overnight stay.   Contacts, glasses, hearing aids, dentures or partials may not be worn into surgery, please bring cases for these belongings   For patients admitted to the hospital, discharge time will be determined by your treatment team.   Patients discharged the day of surgery will not be allowed to drive  home, and someone needs to stay with them for 24 hours.   SURGICAL WAITING ROOM VISITATION Patients having surgery or a procedure may have no more than 2 support people in the waiting area - these visitors may rotate.   Children under the age of 47 must have an adult with them who is not the patient. If the patient needs to stay at the hospital during part of their recovery, the visitor guidelines for inpatient rooms apply. Pre-op nurse will coordinate an appropriate time for 1 support person to accompany patient in pre-op.  This support person may not rotate.   Please refer to [email or web address] for the visitor guidelines for Inpatients (after your surgery is over and you are in a regular room).    Special instructions:    Oral Hygiene is also important to reduce your risk of infection.  Remember - BRUSH YOUR TEETH THE MORNING OF SURGERY WITH YOUR REGULAR TOOTHPASTE   [place]- Preparing For Surgery  Before surgery, you can play an important role. Because skin is not sterile, your skin needs to be as free of germs as possible. You can reduce the number of germs on your skin by washing with CHG (chlorahexidine gluconate) Soap before surgery.  CHG is an antiseptic cleaner which kills germs and bonds with the skin to continue killing germs even after washing.     Please do not use if you have an allergy  to CHG or antibacterial soaps. If your skin becomes reddened/irritated stop using the CHG.  Do not shave (including legs and underarms) for at least 48 hours prior to first CHG shower. It is OK to shave your face.  Please follow these instructions carefully.     Shower the NIGHT BEFORE SURGERY and the MORNING OF SURGERY with CHG Soap.   If you chose to wash your hair, wash your hair first as usual with your normal shampoo. After you shampoo, rinse your hair and body thoroughly to remove the shampoo.  Then [employer] and genitals (private  parts) with your normal soap and rinse thoroughly to remove soap.  After that Use CHG Soap as you would any other liquid soap. You can apply CHG directly to the skin and wash gently with a scrungie or a clean washcloth.   Apply the CHG Soap to your body ONLY FROM THE NECK DOWN.  Do not use on open wounds or open sores. Avoid contact with your eyes, ears, mouth and genitals (private parts). Wash Face and genitals (private parts)  with your normal soap.   Wash thoroughly, paying special attention to the area where your surgery will be performed.  Thoroughly rinse your body with warm water from the neck down.  DO NOT shower/wash with your normal soap after using and rinsing off the CHG Soap.  Pat yourself dry with a CLEAN TOWEL.  Wear CLEAN PAJAMAS to bed the night before surgery  Place CLEAN SHEETS on your bed the night before your surgery  DO NOT SLEEP WITH PETS.   Day of Surgery:  Take a shower with CHG soap. Wear Clean/Comfortable clothing the morning of surgery Do not apply any deodorants/lotions.   Remember to brush your teeth WITH YOUR REGULAR TOOTHPASTE.    If you received a COVID test during your pre-op visit, it is requested that you wear a mask when out in public, stay away from anyone that may not be feeling well, and notify your surgeon if you develop symptoms. If you have been in contact with anyone that has tested positive in the last 10 days, please notify your surgeon.    Please read over the following fact sheets that you were given.

## 2022-04-07 ENCOUNTER — Encounter (HOSPITAL_COMMUNITY): Payer: Self-pay

## 2022-04-07 ENCOUNTER — Encounter (HOSPITAL_COMMUNITY)
Admission: RE | Admit: 2022-04-07 | Discharge: 2022-04-07 | Disposition: A | Discharge status: Home / Self Care | Payer: Medicare Other | Source: Ambulatory Visit | Attending: Thoracic Surgery (Cardiothoracic Vascular Surgery) | Admitting: Thoracic Surgery (Cardiothoracic Vascular Surgery)

## 2022-04-07 ENCOUNTER — Ambulatory Visit (HOSPITAL_COMMUNITY)
Admission: RE | Admit: 2022-04-07 | Discharge: 2022-04-07 | Disposition: A | Discharge status: Home / Self Care | Payer: Medicare Other | Source: Ambulatory Visit | Attending: Thoracic Surgery (Cardiothoracic Vascular Surgery) | Admitting: Thoracic Surgery (Cardiothoracic Vascular Surgery)

## 2022-04-07 ENCOUNTER — Other Ambulatory Visit: Payer: Self-pay

## 2022-04-07 VITALS — BP 161/71 | HR 69 | Temp 98.2°F | Resp 18 | Ht 70.0 in | Wt 222.7 lb

## 2022-04-07 DIAGNOSIS — Z7901 Long term (current) use of anticoagulants: Secondary | ICD-10-CM | POA: Insufficient documentation

## 2022-04-07 DIAGNOSIS — Z1152 Encounter for screening for COVID-19: Secondary | ICD-10-CM | POA: Diagnosis not present

## 2022-04-07 DIAGNOSIS — I340 Nonrheumatic mitral (valve) insufficiency: Secondary | ICD-10-CM

## 2022-04-07 DIAGNOSIS — Z01818 Encounter for other preprocedural examination: Secondary | ICD-10-CM | POA: Insufficient documentation

## 2022-04-07 DIAGNOSIS — Z8679 Personal history of other diseases of the circulatory system: Secondary | ICD-10-CM | POA: Insufficient documentation

## 2022-04-07 DIAGNOSIS — R7989 Other specified abnormal findings of blood chemistry: Secondary | ICD-10-CM | POA: Insufficient documentation

## 2022-04-07 HISTORY — DX: Sleep apnea, unspecified: G47.30

## 2022-04-07 HISTORY — DX: Essential (primary) hypertension: I10

## 2022-04-07 HISTORY — DX: Dyspnea, unspecified: R06.00

## 2022-04-07 LAB — URINALYSIS, ROUTINE W REFLEX MICROSCOPIC
Bilirubin Urine: NEGATIVE
Glucose, UA: NEGATIVE mg/dL
Hgb urine dipstick: NEGATIVE
Ketones, ur: NEGATIVE mg/dL
Leukocytes,Ua: NEGATIVE
Nitrite: NEGATIVE
Protein, ur: NEGATIVE mg/dL
Specific Gravity, Urine: 1.024 (ref 1.005–1.030)
pH: 5 (ref 5.0–8.0)

## 2022-04-07 LAB — COMPREHENSIVE METABOLIC PANEL
ALT: 13 U/L (ref 0–44)
AST: 20 U/L (ref 15–41)
Albumin: 4.1 g/dL (ref 3.5–5.0)
Alkaline Phosphatase: 79 U/L (ref 38–126)
Anion gap: 14 (ref 5–15)
BUN: 16 mg/dL (ref 8–23)
CO2: 23 mmol/L (ref 22–32)
Calcium: 10 mg/dL (ref 8.9–10.3)
Chloride: 102 mmol/L (ref 98–111)
Creatinine, Ser: 0.75 mg/dL (ref 0.61–1.24)
GFR, Estimated: >60 mL/min (ref >60)
Glucose, Bld: 103 mg/dL — ABNORMAL HIGH (ref 70–99)
Potassium: 4.1 mmol/L (ref 3.5–5.1)
Sodium: 139 mmol/L (ref 135–145)
Total Bilirubin: 2.2 mg/dL — ABNORMAL HIGH (ref 0.3–1.2)
Total Protein: 7 g/dL (ref 6.5–8.1)

## 2022-04-07 LAB — SURGICAL PCR SCREEN
MRSA, PCR: NEGATIVE
Staphylococcus aureus: NEGATIVE

## 2022-04-07 LAB — BLOOD GAS, ARTERIAL
Acid-Base Excess: 4.7 mmol/L — ABNORMAL HIGH (ref 0.0–2.0)
Bicarbonate: 29.2 mmol/L — ABNORMAL HIGH (ref 20.0–28.0)
Drawn by: 58793
O2 Saturation: 99.4 %
Patient temperature: 37
pCO2 arterial: 42 mmHg (ref 32–48)
pH, Arterial: 7.45 (ref 7.35–7.45)
pO2, Arterial: 110 mmHg — ABNORMAL HIGH (ref 83–108)

## 2022-04-07 LAB — CBC
HCT: 43.9 % (ref 39.0–52.0)
Hemoglobin: 14.7 g/dL (ref 13.0–17.0)
MCH: 27.5 pg (ref 26.0–34.0)
MCHC: 33.5 g/dL (ref 30.0–36.0)
MCV: 82.2 fL (ref 80.0–100.0)
Platelets: 194 10*3/uL (ref 150–400)
RBC: 5.34 MIL/uL (ref 4.22–5.81)
RDW: 13.2 % (ref 11.5–15.5)
WBC: 7.5 10*3/uL (ref 4.0–10.5)
nRBC: 0 % (ref 0.0–0.2)

## 2022-04-07 LAB — HEMOGLOBIN A1C
Hgb A1c MFr Bld: 5.3 % (ref 4.8–5.6)
Mean Plasma Glucose: 105.41 mg/dL

## 2022-04-07 LAB — TYPE AND SCREEN
ABO/RH(D): O POS
Antibody Screen: NEGATIVE

## 2022-04-07 LAB — APTT: aPTT: 30 seconds (ref 24–36)

## 2022-04-07 LAB — PROTIME-INR
INR: 1.1 (ref 0.8–1.2)
Prothrombin Time: 13.6 seconds (ref 11.4–15.2)

## 2022-04-07 NOTE — Progress Notes (Signed)
PCP - Marjo Bicker MD Cardiologist - Cristopher Peru MD  PPM/ICD - denies Device Orders -  Rep Notified -   Chest x-ray - 04/07/22 EKG - 04/07/22 Stress Test - 06/11/20 ECHO - 03/29/22 Cardiac Cath - 02/22/22  Sleep Study - 03/12/22 CPAP - recommended ,but pt doesn't have  Fasting Blood Sugar - na Checks Blood Sugar _____ times a day  Last dose of GLP1 agonist-  na GLP1 instructions:   Blood Thinner Instructions:pt states he was instructed to stop Xarelto 04/03/22. Aspirin Instructions:na  ERAS Protcol -no PRE-SURGERY Ensure or G2-   COVID TEST- 03/2522   Anesthesia review: cardiac history  Patient denies shortness of breath, fever, cough and chest pain at PAT appointment   All instructions explained to the patient, with a verbal understanding of the material. Patient agrees to go over the instructions while at home for a better understanding. Patient also instructed to wear a mask when out in public after being tested for COVID-19. The opportunity to ask questions was provided.

## 2022-04-08 LAB — SARS CORONAVIRUS 2 (TAT 6-24 HRS): SARS Coronavirus 2: NEGATIVE

## 2022-04-09 ENCOUNTER — Inpatient Hospital Stay (HOSPITAL_COMMUNITY)
Admission: RE | Admit: 2022-04-09 | Payer: Medicare Other | Source: Home / Self Care | Admitting: Thoracic Surgery (Cardiothoracic Vascular Surgery)

## 2022-04-09 ENCOUNTER — Encounter (HOSPITAL_COMMUNITY): Admission: RE | Payer: Self-pay | Source: Home / Self Care

## 2022-04-09 SURGERY — REPAIR, MITRAL VALVE
Anesthesia: General | Site: Chest

## 2022-04-13 ENCOUNTER — Ambulatory Visit (INDEPENDENT_AMBULATORY_CARE_PROVIDER_SITE_OTHER): Payer: Medicare Other | Admitting: Vascular Surgery

## 2022-04-13 ENCOUNTER — Encounter: Payer: Self-pay | Admitting: Vascular Surgery

## 2022-04-13 DIAGNOSIS — I6521 Occlusion and stenosis of right carotid artery: Secondary | ICD-10-CM | POA: Diagnosis not present

## 2022-04-13 DIAGNOSIS — I6529 Occlusion and stenosis of unspecified carotid artery: Secondary | ICD-10-CM | POA: Insufficient documentation
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
## 2022-04-14 ENCOUNTER — Encounter: Payer: Self-pay | Admitting: *Deleted

## 2022-04-14 ENCOUNTER — Other Ambulatory Visit: Payer: Self-pay | Admitting: *Deleted

## 2022-04-14 DIAGNOSIS — I340 Nonrheumatic mitral (valve) insufficiency: Secondary | ICD-10-CM

## 2022-04-20 NOTE — Pre-Procedure Instructions (Signed)
Surgical Instructions    Your procedure is scheduled on April 23, 2022.  Report to [facility] Main Entrance "A" at 5:30 A.M., then check in with the Admitting office.  Call this number if you have problems the morning of surgery:  [phone]   If you have any questions prior to your surgery date call [phone]: Open Monday-Friday 8am-4pm    Remember:  Do not eat or drink after midnight the night before your surgery      Take these medicines the morning of surgery with A SIP OF WATER:  amLODipine (NORVASC)     Take these medicines the morning of surgery with a sip of water AS NEEDED:  clonazePAM (KLONOPIN)   HYDROcodone-acetaminophen (NORCO)   LORazepam (ATIVAN)       STOP taking XARELTO Five Days prior to your surgery. Your last dose of this medication will be November 4th.    As of today, STOP taking any Aspirin (unless otherwise instructed by your surgeon) Aleve, Naproxen, Ibuprofen, Motrin, Advil, Goody's, BC's, all herbal medications, fish oil, and all vitamins.                     Do NOT Smoke (Tobacco/Vaping) for 24 hours prior to your procedure.  If you use a CPAP at night, you may bring your mask/headgear for your overnight stay.   Contacts, glasses, piercing's, hearing aid's, dentures or partials may not be worn into surgery, please bring cases for these belongings.    For patients admitted to the hospital, discharge time will be determined by your treatment team.   Patients discharged the day of surgery will not be allowed to drive home, and someone needs to stay with them for 24 hours.  SURGICAL WAITING ROOM VISITATION Patients having surgery or a procedure may have no more than 2 support people in the waiting area - these visitors may rotate.   Children under the age of 29 must have an adult with them who is not the patient. If the patient needs to stay at the hospital during part of their recovery, the visitor guidelines for inpatient rooms  apply. Pre-op nurse will coordinate an appropriate time for 1 support person to accompany patient in pre-op.  This support person may not rotate.   Please refer to the [facility] website for the visitor guidelines for Inpatients (after your surgery is over and you are in a regular room).    Special instructions:   [place]- Preparing For Surgery  Before surgery, you can play an important role. Because skin is not sterile, your skin needs to be as free of germs as possible. You can reduce the number of germs on your skin by washing with CHG (chlorahexidine gluconate) Soap before surgery.  CHG is an antiseptic cleaner which kills germs and bonds with the skin to continue killing germs even after washing.    Oral Hygiene is also important to reduce your risk of infection.  Remember - BRUSH YOUR TEETH THE MORNING OF SURGERY WITH YOUR REGULAR TOOTHPASTE  Please do not use if you have an allergy to CHG or antibacterial soaps. If your skin becomes reddened/irritated stop using the CHG.  Do not shave (including legs and underarms) for at least 48 hours prior to first CHG shower. It is OK to shave your face.  Please follow these instructions carefully.   Shower the NIGHT BEFORE SURGERY and the MORNING OF SURGERY  If you chose to wash your hair, wash your hair first as  usual with your normal shampoo.  After you shampoo, rinse your hair and body thoroughly to remove the shampoo.  Use CHG Soap as you would any other liquid soap. You can apply CHG directly to the skin and wash gently with a scrungie or a clean washcloth.   Apply the CHG Soap to your body ONLY FROM THE NECK DOWN.  Do not use on open wounds or open sores. Avoid contact with your eyes, ears, mouth and genitals (private parts). Wash Face and genitals (private parts)  with your normal soap.   Wash thoroughly, paying special attention to the area where your surgery will be performed.  Thoroughly rinse your body with warm water from the  neck down.  DO NOT shower/wash with your normal soap after using and rinsing off the CHG Soap.  Pat yourself dry with a CLEAN TOWEL.  Wear CLEAN PAJAMAS to bed the night before surgery  Place CLEAN SHEETS on your bed the night before your surgery  DO NOT SLEEP WITH PETS.   Day of Surgery: Take a shower with CHG soap. Do not wear jewelry or makeup Do not wear lotions, powders, perfumes/colognes, or deodorant. Do not shave 48 hours prior to surgery.  Men may shave face and neck. Do not bring valuables to the hospital.  [facility] is not responsible for any belongings or valuables. Do not wear nail polish, gel polish, artificial nails, or any other type of covering on natural nails (fingers and toes) If you have artificial nails or gel coating that need to be removed by a nail salon, please have this removed prior to surgery. Artificial nails or gel coating may interfere with anesthesia's ability to adequately monitor your vital signs.  Wear Clean/Comfortable clothing the morning of surgery Remember to brush your teeth WITH YOUR REGULAR TOOTHPASTE.   Please read over the following fact sheets that you were given.    If you received a COVID test during your pre-op visit  it is requested that you wear a mask when out in public, stay away from anyone that may not be feeling well and notify your surgeon if you develop symptoms. If you have been in contact with anyone that has tested positive in the last 10 days please notify you surgeon.

## 2022-04-21 ENCOUNTER — Encounter (HOSPITAL_COMMUNITY): Payer: Self-pay

## 2022-04-21 ENCOUNTER — Other Ambulatory Visit: Payer: Self-pay

## 2022-04-21 ENCOUNTER — Encounter (HOSPITAL_COMMUNITY)
Admission: RE | Admit: 2022-04-21 | Discharge: 2022-04-21 | Disposition: A | Discharge status: Home / Self Care | Payer: Medicare Other | Source: Ambulatory Visit | Attending: Thoracic Surgery (Cardiothoracic Vascular Surgery) | Admitting: Thoracic Surgery (Cardiothoracic Vascular Surgery)

## 2022-04-21 ENCOUNTER — Ambulatory Visit (HOSPITAL_COMMUNITY)
Admission: RE | Admit: 2022-04-21 | Discharge: 2022-04-21 | Disposition: A | Discharge status: Home / Self Care | Payer: Medicare Other | Source: Ambulatory Visit | Attending: Thoracic Surgery (Cardiothoracic Vascular Surgery) | Admitting: Thoracic Surgery (Cardiothoracic Vascular Surgery)

## 2022-04-21 VITALS — BP 138/75 | HR 57 | Temp 97.5°F | Resp 17 | Ht 70.0 in | Wt 223.0 lb

## 2022-04-21 DIAGNOSIS — Z01818 Encounter for other preprocedural examination: Secondary | ICD-10-CM | POA: Insufficient documentation

## 2022-04-21 DIAGNOSIS — Z1152 Encounter for screening for COVID-19: Secondary | ICD-10-CM | POA: Insufficient documentation

## 2022-04-21 DIAGNOSIS — I340 Nonrheumatic mitral (valve) insufficiency: Secondary | ICD-10-CM | POA: Insufficient documentation

## 2022-04-21 HISTORY — DX: Cardiac arrhythmia, unspecified: I49.9

## 2022-04-21 HISTORY — DX: Cardiac murmur, unspecified: R01.1

## 2022-04-21 LAB — CBC
HCT: 45.7 % (ref 39.0–52.0)
Hemoglobin: 14.8 g/dL (ref 13.0–17.0)
MCH: 26.8 pg (ref 26.0–34.0)
MCHC: 32.4 g/dL (ref 30.0–36.0)
MCV: 82.8 fL (ref 80.0–100.0)
Platelets: 206 10*3/uL (ref 150–400)
RBC: 5.52 MIL/uL (ref 4.22–5.81)
RDW: 13.1 % (ref 11.5–15.5)
WBC: 7 10*3/uL (ref 4.0–10.5)
nRBC: 0 % (ref 0.0–0.2)

## 2022-04-21 LAB — TYPE AND SCREEN
ABO/RH(D): O POS
Antibody Screen: NEGATIVE

## 2022-04-21 LAB — URINALYSIS, ROUTINE W REFLEX MICROSCOPIC
Bilirubin Urine: NEGATIVE
Glucose, UA: NEGATIVE mg/dL
Hgb urine dipstick: NEGATIVE
Ketones, ur: NEGATIVE mg/dL
Leukocytes,Ua: NEGATIVE
Nitrite: NEGATIVE
Protein, ur: NEGATIVE mg/dL
Specific Gravity, Urine: 1.025 (ref 1.005–1.030)
pH: 5 (ref 5.0–8.0)

## 2022-04-21 LAB — BLOOD GAS, ARTERIAL
Acid-Base Excess: 6.4 mmol/L — ABNORMAL HIGH (ref 0.0–2.0)
Bicarbonate: 30.5 mmol/L — ABNORMAL HIGH (ref 20.0–28.0)
Drawn by: 58793
O2 Saturation: 100 %
Patient temperature: 37
pCO2 arterial: 41 mmHg (ref 32–48)
pH, Arterial: 7.48 — ABNORMAL HIGH (ref 7.35–7.45)
pO2, Arterial: 121 mmHg — ABNORMAL HIGH (ref 83–108)

## 2022-04-21 LAB — APTT: aPTT: 30 seconds (ref 24–36)

## 2022-04-21 LAB — COMPREHENSIVE METABOLIC PANEL
ALT: 15 U/L (ref 0–44)
AST: 20 U/L (ref 15–41)
Albumin: 4.2 g/dL (ref 3.5–5.0)
Alkaline Phosphatase: 80 U/L (ref 38–126)
Anion gap: 12 (ref 5–15)
BUN: 15 mg/dL (ref 8–23)
CO2: 23 mmol/L (ref 22–32)
Calcium: 10 mg/dL (ref 8.9–10.3)
Chloride: 103 mmol/L (ref 98–111)
Creatinine, Ser: 0.77 mg/dL (ref 0.61–1.24)
GFR, Estimated: >60 mL/min (ref >60)
Glucose, Bld: 99 mg/dL (ref 70–99)
Potassium: 4.2 mmol/L (ref 3.5–5.1)
Sodium: 138 mmol/L (ref 135–145)
Total Bilirubin: 2.6 mg/dL — ABNORMAL HIGH (ref 0.3–1.2)
Total Protein: 7.2 g/dL (ref 6.5–8.1)

## 2022-04-21 LAB — HEMOGLOBIN A1C
Hgb A1c MFr Bld: 5.2 % (ref 4.8–5.6)
Mean Plasma Glucose: 102.54 mg/dL

## 2022-04-21 LAB — SURGICAL PCR SCREEN
MRSA, PCR: NEGATIVE
Staphylococcus aureus: NEGATIVE

## 2022-04-21 LAB — PROTIME-INR
INR: 1 (ref 0.8–1.2)
Prothrombin Time: 13.5 seconds (ref 11.4–15.2)

## 2022-04-21 NOTE — Progress Notes (Signed)
PCP - Dr. Cresenciano Lick. [place] Cardiologist - Dr. Cristopher Peru  PPM/ICD - Denies Device Orders - n/a Rep Notified - n/a  Chest x-ray - 04/21/2022 EKG - 04/07/2022 Stress Test - 06/11/2020 ECHO - 02/17/2022 Cardiac Cath - 02/22/2022  Sleep Study - Yes. Pt recently had a positive sleep study CPAP - He has not received CPAP yet, but MD is waiting until after surgery to get him set up with it.   No DM  Last dose of GLP1 agonist- n/a GLP1 instructions: n/a  Blood Thinner Instructions: Pt instructed to hold Xarelto x5 days. Last dose was November 4th. Aspirin Instructions: n/a  NPO after midnight   COVID TEST- Yes. Result Pending   Anesthesia review: Yes. Cardiac Hx.  Patient denies shortness of breath, fever, cough and chest pain at PAT appointment   All instructions explained to the patient, with a verbal understanding of the material. Patient agrees to go over the instructions while at home for a better understanding. Patient also instructed to self quarantine after being tested for COVID-19. The opportunity to ask questions was provided.

## 2022-04-22 LAB — SARS CORONAVIRUS 2 (TAT 6-24 HRS): SARS Coronavirus 2: NEGATIVE

## 2022-04-22 MED ORDER — EPINEPHRINE HCL 5 MG/250ML IV SOLN IN NS
0.0000 ug/min | INTRAVENOUS | Status: DC
  Filled 2022-04-22: qty 250

## 2022-04-22 MED ORDER — HEPARIN 30,000 UNITS/1000 ML (OHS) CELLSAVER SOLUTION
Status: DC
  Filled 2022-04-22: qty 1000

## 2022-04-22 MED ORDER — INSULIN REGULAR(HUMAN) IN NACL 100-0.9 UT/100ML-% IV SOLN
INTRAVENOUS | Status: DC
  Filled 2022-04-22: qty 100

## 2022-04-22 MED ORDER — VANCOMYCIN HCL 1500 MG/300ML IV SOLN
1500.0000 mg | INTRAVENOUS | Status: AC
  Administered 2022-04-23: 1500 mg via INTRAVENOUS
  Filled 2022-04-22: qty 300

## 2022-04-22 MED ORDER — CEFAZOLIN SODIUM-DEXTROSE 2-4 GM/100ML-% IV SOLN
2.0000 g | INTRAVENOUS | Status: AC
  Administered 2022-04-23: 2 mg via INTRAVENOUS
  Filled 2022-04-22: qty 100

## 2022-04-22 MED ORDER — PHENYLEPHRINE HCL-NACL 20-0.9 MG/250ML-% IV SOLN
30.0000 ug/min | INTRAVENOUS | Status: AC
  Administered 2022-04-23: 25 ug/min via INTRAVENOUS
  Filled 2022-04-22: qty 250

## 2022-04-22 MED ORDER — NOREPINEPHRINE 4 MG/250ML-% IV SOLN
0.0000 ug/min | INTRAVENOUS | Status: DC
  Filled 2022-04-22: qty 250

## 2022-04-22 MED ORDER — TRANEXAMIC ACID (OHS) BOLUS VIA INFUSION
15.0000 mg/kg | INTRAVENOUS | Status: AC
  Administered 2022-04-23: 1518 mg via INTRAVENOUS
  Filled 2022-04-22: qty 1518

## 2022-04-22 MED ORDER — CEFAZOLIN SODIUM-DEXTROSE 2-4 GM/100ML-% IV SOLN
2.0000 g | INTRAVENOUS | Status: AC
  Administered 2022-04-23 (×2): 2 g via INTRAVENOUS
  Filled 2022-04-22 (×2): qty 100

## 2022-04-22 MED ORDER — MANNITOL 20 % IV SOLN
Freq: Once | INTRAVENOUS | Status: DC
  Filled 2022-04-22: qty 13

## 2022-04-22 MED ORDER — TRANEXAMIC ACID (OHS) PUMP PRIME SOLUTION
2.0000 mg/kg | INTRAVENOUS | Status: DC
  Filled 2022-04-22: qty 2.02

## 2022-04-22 MED ORDER — MILRINONE LACTATE IN DEXTROSE 20-5 MG/100ML-% IV SOLN
0.3000 ug/kg/min | INTRAVENOUS | Status: DC
  Filled 2022-04-22: qty 100

## 2022-04-22 MED ORDER — DEXMEDETOMIDINE HCL IN NACL 400 MCG/100ML IV SOLN
0.1000 ug/kg/h | INTRAVENOUS | Status: DC
  Filled 2022-04-22: qty 100

## 2022-04-22 MED ORDER — POTASSIUM CHLORIDE 2 MEQ/ML IV SOLN
80.0000 meq | INTRAVENOUS | Status: DC
  Filled 2022-04-22: qty 40

## 2022-04-22 MED ORDER — VANCOMYCIN HCL 1000 MG IV SOLR
INTRAVENOUS | Status: DC
  Filled 2022-04-22: qty 20

## 2022-04-22 MED ORDER — TRANEXAMIC ACID 1000 MG/10ML IV SOLN
1.5000 mg/kg/h | INTRAVENOUS | Status: DC
  Filled 2022-04-22: qty 25

## 2022-04-22 MED ORDER — PLASMA-LYTE A IV SOLN
INTRAVENOUS | Status: DC
  Filled 2022-04-22: qty 2.5
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
## 2022-04-23 ENCOUNTER — Encounter (HOSPITAL_COMMUNITY): Payer: Self-pay | Admitting: Thoracic Surgery (Cardiothoracic Vascular Surgery)

## 2022-04-23 ENCOUNTER — Inpatient Hospital Stay (HOSPITAL_COMMUNITY): Payer: Medicare Other | Admitting: Certified Registered Nurse Anesthetist

## 2022-04-23 ENCOUNTER — Inpatient Hospital Stay (HOSPITAL_COMMUNITY)
Admission: RE | Admit: 2022-04-23 | Discharge: 2022-04-29 | DRG: 220 | Disposition: A | Discharge status: Home / Self Care | Payer: Medicare Other | Attending: Thoracic Surgery (Cardiothoracic Vascular Surgery) | Admitting: Thoracic Surgery (Cardiothoracic Vascular Surgery)

## 2022-04-23 ENCOUNTER — Other Ambulatory Visit: Payer: Self-pay

## 2022-04-23 ENCOUNTER — Inpatient Hospital Stay (HOSPITAL_COMMUNITY): Payer: Medicare Other

## 2022-04-23 ENCOUNTER — Inpatient Hospital Stay (HOSPITAL_COMMUNITY): Payer: Medicare Other | Admitting: Physician Assistant

## 2022-04-23 ENCOUNTER — Encounter (HOSPITAL_COMMUNITY)
Admission: RE | Disposition: A | Discharge status: Home / Self Care | Payer: Self-pay | Source: Home / Self Care | Attending: Thoracic Surgery (Cardiothoracic Vascular Surgery)

## 2022-04-23 DIAGNOSIS — Z91013 Allergy to seafood: Secondary | ICD-10-CM

## 2022-04-23 DIAGNOSIS — F32A Depression, unspecified: Secondary | ICD-10-CM | POA: Diagnosis present

## 2022-04-23 DIAGNOSIS — I442 Atrioventricular block, complete: Secondary | ICD-10-CM | POA: Diagnosis not present

## 2022-04-23 DIAGNOSIS — I471 Supraventricular tachycardia, unspecified: Secondary | ICD-10-CM | POA: Diagnosis present

## 2022-04-23 DIAGNOSIS — I119 Hypertensive heart disease without heart failure: Secondary | ICD-10-CM

## 2022-04-23 DIAGNOSIS — I482 Chronic atrial fibrillation, unspecified: Secondary | ICD-10-CM

## 2022-04-23 DIAGNOSIS — I10 Essential (primary) hypertension: Secondary | ICD-10-CM | POA: Diagnosis present

## 2022-04-23 DIAGNOSIS — Z923 Personal history of irradiation: Secondary | ICD-10-CM

## 2022-04-23 DIAGNOSIS — J9601 Acute respiratory failure with hypoxia: Secondary | ICD-10-CM

## 2022-04-23 DIAGNOSIS — J90 Pleural effusion, not elsewhere classified: Secondary | ICD-10-CM | POA: Diagnosis not present

## 2022-04-23 DIAGNOSIS — I341 Nonrheumatic mitral (valve) prolapse: Secondary | ICD-10-CM

## 2022-04-23 DIAGNOSIS — Z79899 Other long term (current) drug therapy: Secondary | ICD-10-CM | POA: Diagnosis not present

## 2022-04-23 DIAGNOSIS — E8770 Fluid overload, unspecified: Secondary | ICD-10-CM | POA: Diagnosis not present

## 2022-04-23 DIAGNOSIS — I340 Nonrheumatic mitral (valve) insufficiency: Secondary | ICD-10-CM | POA: Diagnosis present

## 2022-04-23 DIAGNOSIS — G8929 Other chronic pain: Secondary | ICD-10-CM | POA: Diagnosis present

## 2022-04-23 DIAGNOSIS — R001 Bradycardia, unspecified: Secondary | ICD-10-CM | POA: Diagnosis not present

## 2022-04-23 DIAGNOSIS — M549 Dorsalgia, unspecified: Secondary | ICD-10-CM | POA: Diagnosis present

## 2022-04-23 DIAGNOSIS — I4821 Permanent atrial fibrillation: Secondary | ICD-10-CM | POA: Diagnosis present

## 2022-04-23 DIAGNOSIS — E876 Hypokalemia: Secondary | ICD-10-CM | POA: Diagnosis not present

## 2022-04-23 DIAGNOSIS — D62 Acute posthemorrhagic anemia: Secondary | ICD-10-CM | POA: Diagnosis not present

## 2022-04-23 DIAGNOSIS — Z7901 Long term (current) use of anticoagulants: Secondary | ICD-10-CM | POA: Diagnosis not present

## 2022-04-23 DIAGNOSIS — Z8249 Family history of ischemic heart disease and other diseases of the circulatory system: Secondary | ICD-10-CM

## 2022-04-23 DIAGNOSIS — F419 Anxiety disorder, unspecified: Secondary | ICD-10-CM | POA: Diagnosis present

## 2022-04-23 DIAGNOSIS — Z9049 Acquired absence of other specified parts of digestive tract: Secondary | ICD-10-CM | POA: Diagnosis not present

## 2022-04-23 DIAGNOSIS — D696 Thrombocytopenia, unspecified: Secondary | ICD-10-CM | POA: Diagnosis not present

## 2022-04-23 DIAGNOSIS — I4891 Unspecified atrial fibrillation: Secondary | ICD-10-CM | POA: Diagnosis not present

## 2022-04-23 DIAGNOSIS — Z1152 Encounter for screening for COVID-19: Secondary | ICD-10-CM | POA: Diagnosis not present

## 2022-04-23 DIAGNOSIS — D72829 Elevated white blood cell count, unspecified: Secondary | ICD-10-CM | POA: Diagnosis not present

## 2022-04-23 DIAGNOSIS — Z9889 Other specified postprocedural states: Secondary | ICD-10-CM | POA: Diagnosis present

## 2022-04-23 DIAGNOSIS — F17220 Nicotine dependence, chewing tobacco, uncomplicated: Secondary | ICD-10-CM | POA: Diagnosis present

## 2022-04-23 HISTORY — PX: MITRAL VALVE REPAIR: SHX2039

## 2022-04-23 HISTORY — PX: TEE WITHOUT CARDIOVERSION: SHX5443

## 2022-04-23 HISTORY — PX: MAZE: SHX5063

## 2022-04-23 LAB — POCT I-STAT, CHEM 8
BUN: 17 mg/dL (ref 8–23)
BUN: 13 mg/dL (ref 8–23)
BUN: 12 mg/dL (ref 8–23)
BUN: 13 mg/dL (ref 8–23)
BUN: 12 mg/dL (ref 8–23)
Calcium, Ion: 1.24 mmol/L (ref 1.15–1.40)
Calcium, Ion: 1.25 mmol/L (ref 1.15–1.40)
Calcium, Ion: 1.11 mmol/L — ABNORMAL LOW (ref 1.15–1.40)
Calcium, Ion: 1.2 mmol/L (ref 1.15–1.40)
Calcium, Ion: 1.17 mmol/L (ref 1.15–1.40)
Chloride: 99 mmol/L (ref 98–111)
Chloride: 98 mmol/L (ref 98–111)
Chloride: 99 mmol/L (ref 98–111)
Chloride: 98 mmol/L (ref 98–111)
Chloride: 99 mmol/L (ref 98–111)
Creatinine, Ser: 0.6 mg/dL — ABNORMAL LOW (ref 0.61–1.24)
Creatinine, Ser: 0.5 mg/dL — ABNORMAL LOW (ref 0.61–1.24)
Creatinine, Ser: 0.5 mg/dL — ABNORMAL LOW (ref 0.61–1.24)
Creatinine, Ser: 0.6 mg/dL — ABNORMAL LOW (ref 0.61–1.24)
Creatinine, Ser: 0.5 mg/dL — ABNORMAL LOW (ref 0.61–1.24)
Glucose, Bld: 109 mg/dL — ABNORMAL HIGH (ref 70–99)
Glucose, Bld: 113 mg/dL — ABNORMAL HIGH (ref 70–99)
Glucose, Bld: 158 mg/dL — ABNORMAL HIGH (ref 70–99)
Glucose, Bld: 181 mg/dL — ABNORMAL HIGH (ref 70–99)
Glucose, Bld: 155 mg/dL — ABNORMAL HIGH (ref 70–99)
HCT: 39 % (ref 39.0–52.0)
HCT: 35 % — ABNORMAL LOW (ref 39.0–52.0)
HCT: 31 % — ABNORMAL LOW (ref 39.0–52.0)
HCT: 33 % — ABNORMAL LOW (ref 39.0–52.0)
HCT: 32 % — ABNORMAL LOW (ref 39.0–52.0)
Hemoglobin: 13.3 g/dL (ref 13.0–17.0)
Hemoglobin: 11.9 g/dL — ABNORMAL LOW (ref 13.0–17.0)
Hemoglobin: 10.5 g/dL — ABNORMAL LOW (ref 13.0–17.0)
Hemoglobin: 11.2 g/dL — ABNORMAL LOW (ref 13.0–17.0)
Hemoglobin: 10.9 g/dL — ABNORMAL LOW (ref 13.0–17.0)
Potassium: 4.4 mmol/L (ref 3.5–5.1)
Potassium: 3.8 mmol/L (ref 3.5–5.1)
Potassium: 4.3 mmol/L (ref 3.5–5.1)
Potassium: 4 mmol/L (ref 3.5–5.1)
Potassium: 3.7 mmol/L (ref 3.5–5.1)
Sodium: 139 mmol/L (ref 135–145)
Sodium: 139 mmol/L (ref 135–145)
Sodium: 137 mmol/L (ref 135–145)
Sodium: 138 mmol/L (ref 135–145)
Sodium: 139 mmol/L (ref 135–145)
TCO2: 31 mmol/L (ref 22–32)
TCO2: 26 mmol/L (ref 22–32)
TCO2: 28 mmol/L (ref 22–32)
TCO2: 31 mmol/L (ref 22–32)
TCO2: 28 mmol/L (ref 22–32)

## 2022-04-23 LAB — POCT I-STAT EG7
Acid-Base Excess: 3 mmol/L — ABNORMAL HIGH (ref 0.0–2.0)
Bicarbonate: 27.4 mmol/L (ref 20.0–28.0)
Calcium, Ion: 1.16 mmol/L (ref 1.15–1.40)
HCT: 31 % — ABNORMAL LOW (ref 39.0–52.0)
Hemoglobin: 10.5 g/dL — ABNORMAL LOW (ref 13.0–17.0)
O2 Saturation: 83 %
Potassium: 3.6 mmol/L (ref 3.5–5.1)
Sodium: 139 mmol/L (ref 135–145)
TCO2: 29 mmol/L (ref 22–32)
pCO2, Ven: 42.4 mmHg — ABNORMAL LOW (ref 44–60)
pH, Ven: 7.418 (ref 7.25–7.43)
pO2, Ven: 47 mmHg — ABNORMAL HIGH (ref 32–45)

## 2022-04-23 LAB — POCT I-STAT 7, (LYTES, BLD GAS, ICA,H+H)
Acid-Base Excess: 7 mmol/L — ABNORMAL HIGH (ref 0.0–2.0)
Acid-Base Excess: 4 mmol/L — ABNORMAL HIGH (ref 0.0–2.0)
Acid-Base Excess: 5 mmol/L — ABNORMAL HIGH (ref 0.0–2.0)
Acid-Base Excess: 3 mmol/L — ABNORMAL HIGH (ref 0.0–2.0)
Acid-Base Excess: 2 mmol/L (ref 0.0–2.0)
Acid-Base Excess: 2 mmol/L (ref 0.0–2.0)
Acid-Base Excess: 0 mmol/L (ref 0.0–2.0)
Acid-base deficit: 4 mmol/L — ABNORMAL HIGH (ref 0.0–2.0)
Acid-base deficit: 6 mmol/L — ABNORMAL HIGH (ref 0.0–2.0)
Bicarbonate: 31.6 mmol/L — ABNORMAL HIGH (ref 20.0–28.0)
Bicarbonate: 28.1 mmol/L — ABNORMAL HIGH (ref 20.0–28.0)
Bicarbonate: 29 mmol/L — ABNORMAL HIGH (ref 20.0–28.0)
Bicarbonate: 31.7 mmol/L — ABNORMAL HIGH (ref 20.0–28.0)
Bicarbonate: 26.6 mmol/L (ref 20.0–28.0)
Bicarbonate: 27.4 mmol/L (ref 20.0–28.0)
Bicarbonate: 25.7 mmol/L (ref 20.0–28.0)
Bicarbonate: 20.7 mmol/L (ref 20.0–28.0)
Bicarbonate: 19 mmol/L — ABNORMAL LOW (ref 20.0–28.0)
Calcium, Ion: 1.26 mmol/L (ref 1.15–1.40)
Calcium, Ion: 1.08 mmol/L — ABNORMAL LOW (ref 1.15–1.40)
Calcium, Ion: 1.11 mmol/L — ABNORMAL LOW (ref 1.15–1.40)
Calcium, Ion: 1.21 mmol/L (ref 1.15–1.40)
Calcium, Ion: 1.13 mmol/L — ABNORMAL LOW (ref 1.15–1.40)
Calcium, Ion: 1.17 mmol/L (ref 1.15–1.40)
Calcium, Ion: 1.19 mmol/L (ref 1.15–1.40)
Calcium, Ion: 1.16 mmol/L (ref 1.15–1.40)
Calcium, Ion: 1.13 mmol/L — ABNORMAL LOW (ref 1.15–1.40)
HCT: 40 % (ref 39.0–52.0)
HCT: 29 % — ABNORMAL LOW (ref 39.0–52.0)
HCT: 30 % — ABNORMAL LOW (ref 39.0–52.0)
HCT: 32 % — ABNORMAL LOW (ref 39.0–52.0)
HCT: 29 % — ABNORMAL LOW (ref 39.0–52.0)
HCT: 33 % — ABNORMAL LOW (ref 39.0–52.0)
HCT: 37 % — ABNORMAL LOW (ref 39.0–52.0)
HCT: 37 % — ABNORMAL LOW (ref 39.0–52.0)
HCT: 36 % — ABNORMAL LOW (ref 39.0–52.0)
Hemoglobin: 13.6 g/dL (ref 13.0–17.0)
Hemoglobin: 10.2 g/dL — ABNORMAL LOW (ref 13.0–17.0)
Hemoglobin: 9.9 g/dL — ABNORMAL LOW (ref 13.0–17.0)
Hemoglobin: 10.9 g/dL — ABNORMAL LOW (ref 13.0–17.0)
Hemoglobin: 9.9 g/dL — ABNORMAL LOW (ref 13.0–17.0)
Hemoglobin: 11.2 g/dL — ABNORMAL LOW (ref 13.0–17.0)
Hemoglobin: 12.6 g/dL — ABNORMAL LOW (ref 13.0–17.0)
Hemoglobin: 12.6 g/dL — ABNORMAL LOW (ref 13.0–17.0)
Hemoglobin: 12.2 g/dL — ABNORMAL LOW (ref 13.0–17.0)
O2 Saturation: 100 %
O2 Saturation: 100 %
O2 Saturation: 100 %
O2 Saturation: 100 %
O2 Saturation: 100 %
O2 Saturation: 100 %
O2 Saturation: 99 %
O2 Saturation: 98 %
O2 Saturation: 95 %
Patient temperature: 35.1
Patient temperature: 36.8
Patient temperature: 36.8
Potassium: 4.4 mmol/L (ref 3.5–5.1)
Potassium: 3.4 mmol/L — ABNORMAL LOW (ref 3.5–5.1)
Potassium: 3.5 mmol/L (ref 3.5–5.1)
Potassium: 4 mmol/L (ref 3.5–5.1)
Potassium: 4.4 mmol/L (ref 3.5–5.1)
Potassium: 3.7 mmol/L (ref 3.5–5.1)
Potassium: 3.5 mmol/L (ref 3.5–5.1)
Potassium: 3.4 mmol/L — ABNORMAL LOW (ref 3.5–5.1)
Potassium: 3.9 mmol/L (ref 3.5–5.1)
Sodium: 138 mmol/L (ref 135–145)
Sodium: 138 mmol/L (ref 135–145)
Sodium: 138 mmol/L (ref 135–145)
Sodium: 138 mmol/L (ref 135–145)
Sodium: 137 mmol/L (ref 135–145)
Sodium: 139 mmol/L (ref 135–145)
Sodium: 139 mmol/L (ref 135–145)
Sodium: 139 mmol/L (ref 135–145)
Sodium: 139 mmol/L (ref 135–145)
TCO2: 33 mmol/L — ABNORMAL HIGH (ref 22–32)
TCO2: 29 mmol/L (ref 22–32)
TCO2: 30 mmol/L (ref 22–32)
TCO2: 34 mmol/L — ABNORMAL HIGH (ref 22–32)
TCO2: 28 mmol/L (ref 22–32)
TCO2: 29 mmol/L (ref 22–32)
TCO2: 27 mmol/L (ref 22–32)
TCO2: 22 mmol/L (ref 22–32)
TCO2: 20 mmol/L — ABNORMAL LOW (ref 22–32)
pCO2 arterial: 45.4 mmHg (ref 32–48)
pCO2 arterial: 39.9 mmHg (ref 32–48)
pCO2 arterial: 40.7 mmHg (ref 32–48)
pCO2 arterial: 74.4 mmHg (ref 32–48)
pCO2 arterial: 41.3 mmHg (ref 32–48)
pCO2 arterial: 43.4 mmHg (ref 32–48)
pCO2 arterial: 42.3 mmHg (ref 32–48)
pCO2 arterial: 37.4 mmHg (ref 32–48)
pCO2 arterial: 33.6 mmHg (ref 32–48)
pH, Arterial: 7.45 (ref 7.35–7.45)
pH, Arterial: 7.457 — ABNORMAL HIGH (ref 7.35–7.45)
pH, Arterial: 7.46 — ABNORMAL HIGH (ref 7.35–7.45)
pH, Arterial: 7.237 — ABNORMAL LOW (ref 7.35–7.45)
pH, Arterial: 7.417 (ref 7.35–7.45)
pH, Arterial: 7.409 (ref 7.35–7.45)
pH, Arterial: 7.384 (ref 7.35–7.45)
pH, Arterial: 7.35 (ref 7.35–7.45)
pH, Arterial: 7.36 (ref 7.35–7.45)
pO2, Arterial: 277 mmHg — ABNORMAL HIGH (ref 83–108)
pO2, Arterial: 449 mmHg — ABNORMAL HIGH (ref 83–108)
pO2, Arterial: 458 mmHg — ABNORMAL HIGH (ref 83–108)
pO2, Arterial: 438 mmHg — ABNORMAL HIGH (ref 83–108)
pO2, Arterial: 363 mmHg — ABNORMAL HIGH (ref 83–108)
pO2, Arterial: 333 mmHg — ABNORMAL HIGH (ref 83–108)
pO2, Arterial: 123 mmHg — ABNORMAL HIGH (ref 83–108)
pO2, Arterial: 119 mmHg — ABNORMAL HIGH (ref 83–108)
pO2, Arterial: 79 mmHg — ABNORMAL LOW (ref 83–108)

## 2022-04-23 LAB — CBC
HCT: 37.6 % — ABNORMAL LOW (ref 39.0–52.0)
HCT: 38 % — ABNORMAL LOW (ref 39.0–52.0)
Hemoglobin: 12.8 g/dL — ABNORMAL LOW (ref 13.0–17.0)
Hemoglobin: 12.5 g/dL — ABNORMAL LOW (ref 13.0–17.0)
MCH: 27.5 pg (ref 26.0–34.0)
MCH: 27.1 pg (ref 26.0–34.0)
MCHC: 34 g/dL (ref 30.0–36.0)
MCHC: 32.9 g/dL (ref 30.0–36.0)
MCV: 80.9 fL (ref 80.0–100.0)
MCV: 82.4 fL (ref 80.0–100.0)
Platelets: 92 10*3/uL — ABNORMAL LOW (ref 150–400)
Platelets: 121 10*3/uL — ABNORMAL LOW (ref 150–400)
RBC: 4.65 MIL/uL (ref 4.22–5.81)
RBC: 4.61 MIL/uL (ref 4.22–5.81)
RDW: 12.8 % (ref 11.5–15.5)
RDW: 12.8 % (ref 11.5–15.5)
WBC: 16.8 10*3/uL — ABNORMAL HIGH (ref 4.0–10.5)
WBC: 17.5 10*3/uL — ABNORMAL HIGH (ref 4.0–10.5)
nRBC: 0 % (ref 0.0–0.2)
nRBC: 0 % (ref 0.0–0.2)

## 2022-04-23 LAB — BASIC METABOLIC PANEL
Anion gap: 12 (ref 5–15)
BUN: 11 mg/dL (ref 8–23)
CO2: 21 mmol/L — ABNORMAL LOW (ref 22–32)
Calcium: 8.6 mg/dL — ABNORMAL LOW (ref 8.9–10.3)
Chloride: 106 mmol/L (ref 98–111)
Creatinine, Ser: 0.98 mg/dL (ref 0.61–1.24)
GFR, Estimated: >60 mL/min (ref >60)
Glucose, Bld: 147 mg/dL — ABNORMAL HIGH (ref 70–99)
Potassium: 3.4 mmol/L — ABNORMAL LOW (ref 3.5–5.1)
Sodium: 139 mmol/L (ref 135–145)

## 2022-04-23 LAB — PLATELET COUNT: Platelets: 103 10*3/uL — ABNORMAL LOW (ref 150–400)

## 2022-04-23 LAB — GLUCOSE, CAPILLARY
Glucose-Capillary: 121 mg/dL — ABNORMAL HIGH (ref 70–99)
Glucose-Capillary: 96 mg/dL (ref 70–99)
Glucose-Capillary: 114 mg/dL — ABNORMAL HIGH (ref 70–99)
Glucose-Capillary: 130 mg/dL — ABNORMAL HIGH (ref 70–99)
Glucose-Capillary: 143 mg/dL — ABNORMAL HIGH (ref 70–99)
Glucose-Capillary: 143 mg/dL — ABNORMAL HIGH (ref 70–99)
Glucose-Capillary: 147 mg/dL — ABNORMAL HIGH (ref 70–99)
Glucose-Capillary: 133 mg/dL — ABNORMAL HIGH (ref 70–99)
Glucose-Capillary: 174 mg/dL — ABNORMAL HIGH (ref 70–99)
Glucose-Capillary: 179 mg/dL — ABNORMAL HIGH (ref 70–99)
Glucose-Capillary: 168 mg/dL — ABNORMAL HIGH (ref 70–99)

## 2022-04-23 LAB — PROTIME-INR
INR: 1.3 — ABNORMAL HIGH (ref 0.8–1.2)
Prothrombin Time: 16.2 seconds — ABNORMAL HIGH (ref 11.4–15.2)

## 2022-04-23 LAB — HEMOGLOBIN AND HEMATOCRIT, BLOOD
HCT: 29.7 % — ABNORMAL LOW (ref 39.0–52.0)
Hemoglobin: 10.2 g/dL — ABNORMAL LOW (ref 13.0–17.0)

## 2022-04-23 LAB — APTT: aPTT: 29 seconds (ref 24–36)

## 2022-04-23 SURGERY — REPAIR, MITRAL VALVE
Anesthesia: General | Site: Chest

## 2022-04-23 MED ORDER — BISACODYL 10 MG RE SUPP: 10.0000 mg | Freq: Every day | RECTAL | Status: DC

## 2022-04-23 MED ORDER — ASPIRIN 81 MG PO CHEW: 324.0000 mg | CHEWABLE_TABLET | Freq: Every day | ORAL | Status: DC

## 2022-04-23 MED ORDER — 0.9 % SODIUM CHLORIDE (POUR BTL) OPTIME
TOPICAL | Status: DC | PRN
  Administered 2022-04-23: 5000 mL

## 2022-04-23 MED ORDER — ROCURONIUM BROMIDE 10 MG/ML (PF) SYRINGE
PREFILLED_SYRINGE | INTRAVENOUS | Status: AC
  Filled 2022-04-23: qty 10

## 2022-04-23 MED ORDER — VANCOMYCIN HCL 1000 MG IV SOLR
INTRAVENOUS | Status: AC
  Administered 2022-04-23: 1000 mL
  Filled 2022-04-23: qty 20

## 2022-04-23 MED ORDER — FENTANYL CITRATE (PF) 250 MCG/5ML IJ SOLN
INTRAMUSCULAR | Status: AC
  Filled 2022-04-23: qty 5

## 2022-04-23 MED ORDER — DOCUSATE SODIUM 100 MG PO CAPS
200.0000 mg | ORAL_CAPSULE | Freq: Every day | ORAL | Status: DC
  Administered 2022-04-24 – 2022-04-25 (×2): 200 mg via ORAL
  Filled 2022-04-23 (×2): qty 2

## 2022-04-23 MED ORDER — EPINEPHRINE HCL 5 MG/250ML IV SOLN IN NS
INTRAVENOUS | Status: AC
  Filled 2022-04-23: qty 250

## 2022-04-23 MED ORDER — CHLORHEXIDINE GLUCONATE 4 % EX LIQD: 30.0000 mL | CUTANEOUS | Status: DC

## 2022-04-23 MED ORDER — ARTIFICIAL TEARS OPHTHALMIC OINT
TOPICAL_OINTMENT | OPHTHALMIC | Status: DC | PRN
  Administered 2022-04-23: 1 via OPHTHALMIC

## 2022-04-23 MED ORDER — LACTATED RINGERS IV SOLN: INTRAVENOUS | Status: DC

## 2022-04-23 MED ORDER — POTASSIUM CHLORIDE 10 MEQ/50ML IV SOLN
10.0000 meq | INTRAVENOUS | Status: AC
  Administered 2022-04-23 – 2022-04-24 (×3): 10 meq via INTRAVENOUS
  Filled 2022-04-23 (×3): qty 50

## 2022-04-23 MED ORDER — ORAL CARE MOUTH RINSE
15.0000 mL | OROMUCOSAL | Status: DC
  Administered 2022-04-23 (×3): 15 mL via OROMUCOSAL

## 2022-04-23 MED ORDER — PROTAMINE SULFATE 10 MG/ML IV SOLN
INTRAVENOUS | Status: DC | PRN
  Administered 2022-04-23: 30 mg via INTRAVENOUS
  Administered 2022-04-23: 270 mg via INTRAVENOUS

## 2022-04-23 MED ORDER — AMIODARONE LOAD VIA INFUSION
150.0000 mg | Freq: Once | INTRAVENOUS | Status: AC
  Administered 2022-04-23: 150 mg via INTRAVENOUS
  Filled 2022-04-23: qty 83.34

## 2022-04-23 MED ORDER — SODIUM CHLORIDE 0.9% FLUSH
3.0000 mL | Freq: Two times a day (BID) | INTRAVENOUS | Status: DC
  Administered 2022-04-24: 3 mL via INTRAVENOUS

## 2022-04-23 MED ORDER — EPINEPHRINE HCL 5 MG/250ML IV SOLN IN NS
0.5000 ug/min | INTRAVENOUS | Status: DC
  Administered 2022-04-23: 0.5 ug/min via INTRAVENOUS

## 2022-04-23 MED ORDER — SODIUM CHLORIDE 0.45 % IV SOLN: INTRAVENOUS | Status: DC | PRN

## 2022-04-23 MED ORDER — MAGNESIUM SULFATE 4 GM/100ML IV SOLN
4.0000 g | Freq: Once | INTRAVENOUS | Status: AC
  Administered 2022-04-23: 4 g via INTRAVENOUS
  Filled 2022-04-23: qty 100

## 2022-04-23 MED ORDER — FENTANYL CITRATE (PF) 250 MCG/5ML IJ SOLN
INTRAMUSCULAR | Status: DC | PRN
  Administered 2022-04-23: 200 ug via INTRAVENOUS
  Administered 2022-04-23: 100 ug via INTRAVENOUS
  Administered 2022-04-23 (×2): 50 ug via INTRAVENOUS
  Administered 2022-04-23: 100 ug via INTRAVENOUS
  Administered 2022-04-23: 50 ug via INTRAVENOUS
  Administered 2022-04-23: 250 ug via INTRAVENOUS
  Administered 2022-04-23 (×3): 100 ug via INTRAVENOUS

## 2022-04-23 MED ORDER — METOPROLOL TARTRATE 12.5 MG HALF TABLET
ORAL_TABLET | ORAL | Status: AC
  Filled 2022-04-23: qty 1

## 2022-04-23 MED ORDER — CHLORHEXIDINE GLUCONATE 0.12 % MT SOLN
15.0000 mL | OROMUCOSAL | Status: AC
  Filled 2022-04-23: qty 15

## 2022-04-23 MED ORDER — LIDOCAINE 2% (20 MG/ML) 5 ML SYRINGE
INTRAMUSCULAR | Status: AC
  Filled 2022-04-23: qty 5

## 2022-04-23 MED ORDER — SODIUM CHLORIDE (PF) 0.9 % IJ SOLN
INTRAMUSCULAR | Status: AC
  Filled 2022-04-23: qty 10

## 2022-04-23 MED ORDER — SODIUM CHLORIDE 0.9 % IV SOLN: 250.0000 mL | INTRAVENOUS | Status: DC

## 2022-04-23 MED ORDER — HEPARIN SODIUM (PORCINE) 1000 UNIT/ML IJ SOLN
INTRAMUSCULAR | Status: AC
  Filled 2022-04-23: qty 1

## 2022-04-23 MED ORDER — ORAL CARE MOUTH RINSE: 15.0000 mL | OROMUCOSAL | Status: DC | PRN

## 2022-04-23 MED ORDER — ACETAMINOPHEN 500 MG PO TABS
1000.0000 mg | ORAL_TABLET | Freq: Four times a day (QID) | ORAL | Status: AC
  Administered 2022-04-24 – 2022-04-25 (×6): 1000 mg via ORAL
  Filled 2022-04-23 (×10): qty 2

## 2022-04-23 MED ORDER — SODIUM CHLORIDE 0.9% FLUSH: 3.0000 mL | INTRAVENOUS | Status: DC | PRN

## 2022-04-23 MED ORDER — CHLORHEXIDINE GLUCONATE 0.12 % MT SOLN
OROMUCOSAL | Status: AC
  Administered 2022-04-23: 15 mL via OROMUCOSAL
  Filled 2022-04-23: qty 15

## 2022-04-23 MED ORDER — PHENYLEPHRINE 80 MCG/ML (10ML) SYRINGE FOR IV PUSH (FOR BLOOD PRESSURE SUPPORT)
PREFILLED_SYRINGE | INTRAVENOUS | Status: AC
  Filled 2022-04-23: qty 10

## 2022-04-23 MED ORDER — SODIUM CHLORIDE 0.9% FLUSH: 10.0000 mL | INTRAVENOUS | Status: DC | PRN

## 2022-04-23 MED ORDER — LACTATED RINGERS IV SOLN: INTRAVENOUS | Status: DC | PRN

## 2022-04-23 MED ORDER — OXYCODONE HCL 5 MG PO TABS
5.0000 mg | ORAL_TABLET | ORAL | Status: DC | PRN
  Administered 2022-04-24 – 2022-04-29 (×16): 10 mg via ORAL
  Filled 2022-04-23 (×16): qty 2

## 2022-04-23 MED ORDER — ARTIFICIAL TEARS OPHTHALMIC OINT
TOPICAL_OINTMENT | OPHTHALMIC | Status: AC
  Filled 2022-04-23: qty 7

## 2022-04-23 MED ORDER — ROCURONIUM BROMIDE 10 MG/ML (PF) SYRINGE
PREFILLED_SYRINGE | INTRAVENOUS | Status: DC | PRN
  Administered 2022-04-23 (×3): 50 mg via INTRAVENOUS
  Administered 2022-04-23: 100 mg via INTRAVENOUS

## 2022-04-23 MED ORDER — CHLORHEXIDINE GLUCONATE CLOTH 2 % EX PADS
6.0000 | MEDICATED_PAD | Freq: Every day | CUTANEOUS | Status: DC
  Administered 2022-04-23 – 2022-04-24 (×2): 6 via TOPICAL

## 2022-04-23 MED ORDER — ACETAMINOPHEN 160 MG/5ML PO SOLN
1000.0000 mg | Freq: Four times a day (QID) | ORAL | Status: AC
  Administered 2022-04-25 – 2022-04-27 (×5): 1000 mg
  Filled 2022-04-23 (×5): qty 40.6

## 2022-04-23 MED ORDER — PROPOFOL 10 MG/ML IV BOLUS
INTRAVENOUS | Status: DC | PRN
  Administered 2022-04-23: 140 mg via INTRAVENOUS

## 2022-04-23 MED ORDER — ONDANSETRON HCL 4 MG/2ML IJ SOLN
4.0000 mg | Freq: Four times a day (QID) | INTRAMUSCULAR | Status: DC | PRN
  Administered 2022-04-24 – 2022-04-25 (×2): 4 mg via INTRAVENOUS
  Filled 2022-04-23 (×2): qty 2

## 2022-04-23 MED ORDER — POTASSIUM CHLORIDE 10 MEQ/50ML IV SOLN
10.0000 meq | INTRAVENOUS | Status: AC
  Administered 2022-04-23 (×3): 10 meq via INTRAVENOUS

## 2022-04-23 MED ORDER — VANCOMYCIN HCL IN DEXTROSE 1-5 GM/200ML-% IV SOLN
1000.0000 mg | Freq: Once | INTRAVENOUS | Status: AC
  Administered 2022-04-23: 1000 mg via INTRAVENOUS
  Filled 2022-04-23: qty 200

## 2022-04-23 MED ORDER — NITROGLYCERIN IN D5W 200-5 MCG/ML-% IV SOLN: 0.0000 ug/min | INTRAVENOUS | Status: DC

## 2022-04-23 MED ORDER — TRAMADOL HCL 50 MG PO TABS
50.0000 mg | ORAL_TABLET | ORAL | Status: DC | PRN
  Administered 2022-04-24 (×4): 100 mg via ORAL
  Filled 2022-04-23 (×4): qty 2

## 2022-04-23 MED ORDER — PROPOFOL 10 MG/ML IV BOLUS
INTRAVENOUS | Status: AC
  Filled 2022-04-23: qty 20

## 2022-04-23 MED ORDER — MORPHINE SULFATE (PF) 2 MG/ML IV SOLN
1.0000 mg | INTRAVENOUS | Status: DC | PRN
  Administered 2022-04-23: 4 mg via INTRAVENOUS
  Administered 2022-04-23: 2 mg via INTRAVENOUS
  Administered 2022-04-23: 4 mg via INTRAVENOUS
  Administered 2022-04-24: 2 mg via INTRAVENOUS
  Administered 2022-04-24 (×3): 4 mg via INTRAVENOUS
  Administered 2022-04-24: 2 mg via INTRAVENOUS
  Filled 2022-04-23: qty 2
  Filled 2022-04-23: qty 1
  Filled 2022-04-23 (×7): qty 2

## 2022-04-23 MED ORDER — ~~LOC~~ CARDIAC SURGERY, PATIENT & FAMILY EDUCATION
Freq: Once | Status: DC
  Filled 2022-04-23: qty 1

## 2022-04-23 MED ORDER — CEFAZOLIN SODIUM-DEXTROSE 2-4 GM/100ML-% IV SOLN
2.0000 g | Freq: Three times a day (TID) | INTRAVENOUS | Status: AC
  Administered 2022-04-23 – 2022-04-25 (×6): 2 g via INTRAVENOUS
  Filled 2022-04-23 (×6): qty 100

## 2022-04-23 MED ORDER — METOPROLOL TARTRATE 25 MG/10 ML ORAL SUSPENSION: 12.5000 mg | Freq: Two times a day (BID) | ORAL | Status: DC

## 2022-04-23 MED ORDER — CHLORHEXIDINE GLUCONATE 0.12 % MT SOLN: 15.0000 mL | Freq: Once | OROMUCOSAL | Status: DC

## 2022-04-23 MED ORDER — METOPROLOL TARTRATE 5 MG/5ML IV SOLN: 2.5000 mg | INTRAVENOUS | Status: DC | PRN

## 2022-04-23 MED ORDER — MIDAZOLAM HCL (PF) 10 MG/2ML IJ SOLN
INTRAMUSCULAR | Status: AC
  Filled 2022-04-23: qty 2

## 2022-04-23 MED ORDER — SODIUM CHLORIDE 0.9 % IV SOLN: INTRAVENOUS | Status: DC

## 2022-04-23 MED ORDER — AMIODARONE HCL IN DEXTROSE 360-4.14 MG/200ML-% IV SOLN
30.0000 mg/h | INTRAVENOUS | Status: DC
  Administered 2022-04-24: 30 mg/h via INTRAVENOUS

## 2022-04-23 MED ORDER — SODIUM CHLORIDE 0.9% FLUSH
10.0000 mL | Freq: Two times a day (BID) | INTRAVENOUS | Status: DC
  Administered 2022-04-23: 20 mL
  Administered 2022-04-23 – 2022-04-25 (×3): 10 mL

## 2022-04-23 MED ORDER — ACETAMINOPHEN 160 MG/5ML PO SOLN: 650.0000 mg | Freq: Once | ORAL | Status: AC

## 2022-04-23 MED ORDER — DEXTROSE 50 % IV SOLN: 0.0000 mL | INTRAVENOUS | Status: DC | PRN

## 2022-04-23 MED ORDER — HEPARIN SODIUM (PORCINE) 1000 UNIT/ML IJ SOLN
INTRAMUSCULAR | Status: DC | PRN
  Administered 2022-04-23: 40000 [IU] via INTRAVENOUS

## 2022-04-23 MED ORDER — FAMOTIDINE IN NACL 20-0.9 MG/50ML-% IV SOLN
20.0000 mg | Freq: Two times a day (BID) | INTRAVENOUS | Status: DC
  Administered 2022-04-23: 20 mg via INTRAVENOUS
  Filled 2022-04-23: qty 50

## 2022-04-23 MED ORDER — ACETAMINOPHEN 650 MG RE SUPP
650.0000 mg | Freq: Once | RECTAL | Status: AC
  Administered 2022-04-23: 650 mg via RECTAL

## 2022-04-23 MED ORDER — LIDOCAINE 2% (20 MG/ML) 5 ML SYRINGE
INTRAMUSCULAR | Status: DC | PRN
  Administered 2022-04-23: 100 mg via INTRAVENOUS

## 2022-04-23 MED ORDER — PHENYLEPHRINE HCL-NACL 20-0.9 MG/250ML-% IV SOLN: 0.0000 ug/min | INTRAVENOUS | Status: DC

## 2022-04-23 MED ORDER — DEXMEDETOMIDINE HCL IN NACL 400 MCG/100ML IV SOLN
INTRAVENOUS | Status: DC | PRN
  Administered 2022-04-23: .7 ug/kg/h via INTRAVENOUS

## 2022-04-23 MED ORDER — INSULIN REGULAR(HUMAN) IN NACL 100-0.9 UT/100ML-% IV SOLN
INTRAVENOUS | Status: DC | PRN
  Administered 2022-04-23: 1.1 [IU]/h via INTRAVENOUS

## 2022-04-23 MED ORDER — ALBUMIN HUMAN 5 % IV SOLN
250.0000 mL | INTRAVENOUS | Status: AC | PRN
  Administered 2022-04-23 (×4): 12.5 g via INTRAVENOUS
  Filled 2022-04-23: qty 250

## 2022-04-23 MED ORDER — TRANEXAMIC ACID 1000 MG/10ML IV SOLN
INTRAVENOUS | Status: DC | PRN
  Administered 2022-04-23: 1.5 mg/kg/h via INTRAVENOUS

## 2022-04-23 MED ORDER — MIDAZOLAM HCL 5 MG/5ML IJ SOLN
INTRAMUSCULAR | Status: DC | PRN
  Administered 2022-04-23: 1 mg via INTRAVENOUS
  Administered 2022-04-23 (×2): 2 mg via INTRAVENOUS
  Administered 2022-04-23 (×3): 1 mg via INTRAVENOUS

## 2022-04-23 MED ORDER — PLASMA-LYTE A IV SOLN
INTRAVENOUS | Status: DC | PRN
  Administered 2022-04-23: 500 mL via INTRAVASCULAR

## 2022-04-23 MED ORDER — METOPROLOL TARTRATE 12.5 MG HALF TABLET: 12.5000 mg | ORAL_TABLET | Freq: Two times a day (BID) | ORAL | Status: DC

## 2022-04-23 MED ORDER — PROTAMINE SULFATE 10 MG/ML IV SOLN
INTRAVENOUS | Status: AC
  Filled 2022-04-23: qty 50

## 2022-04-23 MED ORDER — HEPARIN SODIUM (PORCINE) 1000 UNIT/ML IJ SOLN
INTRAMUSCULAR | Status: AC
  Filled 2022-04-23: qty 10

## 2022-04-23 MED ORDER — AMIODARONE HCL IN DEXTROSE 360-4.14 MG/200ML-% IV SOLN
30.0000 mg/h | INTRAVENOUS | Status: AC
  Administered 2022-04-23: 30 mg/h via INTRAVENOUS
  Filled 2022-04-23 (×3): qty 200

## 2022-04-23 MED ORDER — DEXMEDETOMIDINE HCL IN NACL 400 MCG/100ML IV SOLN
0.0000 ug/kg/h | INTRAVENOUS | Status: DC
  Administered 2022-04-23: 0.7 ug/kg/h via INTRAVENOUS
  Filled 2022-04-23: qty 100

## 2022-04-23 MED ORDER — BISACODYL 5 MG PO TBEC
10.0000 mg | DELAYED_RELEASE_TABLET | Freq: Every day | ORAL | Status: DC
  Administered 2022-04-24 – 2022-04-25 (×2): 10 mg via ORAL
  Filled 2022-04-23 (×2): qty 2

## 2022-04-23 MED ORDER — PROTAMINE SULFATE 10 MG/ML IV SOLN
INTRAVENOUS | Status: AC
  Filled 2022-04-23: qty 5

## 2022-04-23 MED ORDER — MIDAZOLAM HCL 2 MG/2ML IJ SOLN: 2.0000 mg | INTRAMUSCULAR | Status: DC | PRN

## 2022-04-23 MED ORDER — LACTATED RINGERS IV SOLN
500.0000 mL | Freq: Once | INTRAVENOUS | Status: AC | PRN
  Administered 2022-04-23: 500 mL via INTRAVENOUS

## 2022-04-23 MED ORDER — ASPIRIN 325 MG PO TBEC
325.0000 mg | DELAYED_RELEASE_TABLET | Freq: Every day | ORAL | Status: DC
  Administered 2022-04-24 – 2022-04-25 (×2): 325 mg via ORAL
  Filled 2022-04-23 (×2): qty 1

## 2022-04-23 MED ORDER — PANTOPRAZOLE SODIUM 40 MG PO TBEC
40.0000 mg | DELAYED_RELEASE_TABLET | Freq: Every day | ORAL | Status: DC
  Administered 2022-04-25 – 2022-04-29 (×5): 40 mg via ORAL
  Filled 2022-04-23 (×5): qty 1

## 2022-04-23 MED ORDER — ASPIRIN 325 MG PO TABS: 325.0000 mg | ORAL_TABLET | Freq: Once | ORAL | Status: DC

## 2022-04-23 MED ORDER — INSULIN REGULAR(HUMAN) IN NACL 100-0.9 UT/100ML-% IV SOLN: INTRAVENOUS | Status: DC

## 2022-04-23 MED ORDER — METOPROLOL TARTRATE 12.5 MG HALF TABLET: 12.5000 mg | ORAL_TABLET | Freq: Once | ORAL | Status: DC

## 2022-04-23 SURGICAL SUPPLY — 87 items
ADAPTER CARDIO PERF ANTE/RETRO (ADAPTER) IMPLANT
ADAPTER MULTI PERFUSION 15 (ADAPTER) IMPLANT
BAG DECANTER FOR FLEXI CONT (MISCELLANEOUS) ×2 IMPLANT
BLADE CLIPPER SURG (BLADE) ×2 IMPLANT
BLADE STERNUM SYSTEM 6 (BLADE) ×2 IMPLANT
BOOT SUTURE AID YELLOW STND (SUTURE) ×2 IMPLANT
CANISTER SUCT 3000ML PPV (MISCELLANEOUS) ×2 IMPLANT
CANN PRFSN 3/8XCNCT ST RT ANG (MISCELLANEOUS) ×2
CANNULA EZ GLIDE AORTIC 21FR (CANNULA) IMPLANT
CANNULA NON VENT 22FR 12 (CANNULA) ×2 IMPLANT
CANNULA PRFSN 3/8XCNCT RT ANG (MISCELLANEOUS) IMPLANT
CANNULA SUMP PERICARDIAL (CANNULA) ×2 IMPLANT
CANNULA VEN MTL TIP RT (MISCELLANEOUS) ×2
CANNULA VRC MALB SNGL STG 36FR (MISCELLANEOUS) IMPLANT
CATH CPB KIT GERHARDT (MISCELLANEOUS) ×2 IMPLANT
CATH HEART VENT LEFT (CATHETERS) ×2 IMPLANT
CATH RETROPLEGIA CORONARY 14FR (CATHETERS) IMPLANT
CATH ROBINSON RED A/P 18FR (CATHETERS) ×6 IMPLANT
CATH THOR STR 32F SOFT 20 RADI (CATHETERS) ×2 IMPLANT
CATH THORACIC 28FR RT ANG (CATHETERS) ×2 IMPLANT
CLAMP ISOLATOR SYNERGY LG (MISCELLANEOUS) IMPLANT
CLIP VESOCCLUDE MED 24/CT (CLIP) IMPLANT
CLIP VESOCCLUDE SM WIDE 24/CT (CLIP) IMPLANT
CONNECTOR 1/2X3/8X1/2 3 WAY (MISCELLANEOUS) ×2
CONNECTOR 1/2X3/8X1/2 3WAY (MISCELLANEOUS) IMPLANT
CONTAINER PROTECT SURGISLUSH (MISCELLANEOUS) ×8 IMPLANT
DEFOGGER ANTIFOG KIT (MISCELLANEOUS) ×2 IMPLANT
DEVICE SUT CK QUICK LOAD MINI (Prosthesis & Implant Heart) IMPLANT
DRAPE CV SPLIT W-CLR ANES SCRN (DRAPES) ×2 IMPLANT
DRAPE INCISE IOBAN 66X45 STRL (DRAPES) ×2 IMPLANT
DRAPE PERI GROIN 82X75IN TIB (DRAPES) ×2 IMPLANT
DRAPE WARM FLUID 44X44 (DRAPES) ×2 IMPLANT
DRSG AQUACEL AG ADV 3.5X 6 (GAUZE/BANDAGES/DRESSINGS) IMPLANT
DRSG AQUACEL AG ADV 3.5X14 (GAUZE/BANDAGES/DRESSINGS) ×2 IMPLANT
ELECT CAUTERY BLADE 6.4 (BLADE) ×2 IMPLANT
ELECT REM PT RETURN 9FT ADLT (ELECTROSURGICAL) ×4
ELECTRODE REM PT RTRN 9FT ADLT (ELECTROSURGICAL) ×4 IMPLANT
FELT TEFLON 1X6 (MISCELLANEOUS) ×2 IMPLANT
GAUZE SPONGE 4X4 12PLY STRL (GAUZE/BANDAGES/DRESSINGS) ×2 IMPLANT
GAUZE SPONGE 4X4 12PLY STRL LF (GAUZE/BANDAGES/DRESSINGS) IMPLANT
GLOVE ECLIPSE 7.5 STRL STRAW (GLOVE) IMPLANT
GLOVE SURG SS PI 7.5 STRL IVOR (GLOVE) IMPLANT
GOWN STRL REUS W/ TWL LRG LVL3 (GOWN DISPOSABLE) ×12 IMPLANT
GOWN STRL REUS W/TWL LRG LVL3 (GOWN DISPOSABLE) ×12
INSERT FOGARTY 61MM (MISCELLANEOUS) IMPLANT
INSERT FOGARTY XLG (MISCELLANEOUS) IMPLANT
KIT BASIN OR (CUSTOM PROCEDURE TRAY) ×2 IMPLANT
KIT SUT CK MINI COMBO 4X17 (Prosthesis & Implant Heart) IMPLANT
KIT TURNOVER KIT B (KITS) ×2 IMPLANT
LINE VENT (MISCELLANEOUS) IMPLANT
NS IRRIG 1000ML POUR BTL (IV SOLUTION) ×12 IMPLANT
ORGANIZER SUTURE GABBAY-FRATER (MISCELLANEOUS) IMPLANT
PACK E OPEN HEART (SUTURE) ×2 IMPLANT
PACK OPEN HEART (CUSTOM PROCEDURE TRAY) ×2 IMPLANT
PAD ARMBOARD 7.5X6 YLW CONV (MISCELLANEOUS) ×4 IMPLANT
PAD ELECT DEFIB RADIOL ZOLL (MISCELLANEOUS) ×2 IMPLANT
PENCIL BUTTON HOLSTER BLD 10FT (ELECTRODE) ×2 IMPLANT
POSITIONER HEAD DONUT 9IN (MISCELLANEOUS) ×2 IMPLANT
PROBE CRYO2-ABLATION MALLABLE (MISCELLANEOUS) IMPLANT
RING ANLPLS SIMUFORM 34 (Prosthesis & Implant Heart) IMPLANT
RING ANNULOPLASTY SIMUFORM 34 (Prosthesis & Implant Heart) ×2 IMPLANT
SET MPS 3-ND DEL (MISCELLANEOUS) IMPLANT
SUPPORT HEART JANKE-BARRON (MISCELLANEOUS) ×2 IMPLANT
SUT ETHIBOND 3 0 SH 1 (SUTURE) IMPLANT
SUT GORETEX CV-5 PH-17 (SUTURE) IMPLANT
SUT MNCRL AB 4-0 PS2 18 (SUTURE) ×4 IMPLANT
SUT PROLENE 4 0 RB 1 (SUTURE) ×8
SUT PROLENE 4 0 SH DA (SUTURE) ×6 IMPLANT
SUT PROLENE 4-0 RB1 .5 CRCL 36 (SUTURE) ×4 IMPLANT
SUT PROLENE 5 0 RB 2 (SUTURE) IMPLANT
SUT STEEL 6MS V (SUTURE) ×2 IMPLANT
SUT STEEL SZ 6 DBL 3X14 BALL (SUTURE) ×4 IMPLANT
SUT VIC AB 0 CTX 36 (SUTURE) ×4
SUT VIC AB 0 CTX36XBRD ANTBCTR (SUTURE) ×4 IMPLANT
SUT VIC AB 2-0 CT1 27 (SUTURE) ×4
SUT VIC AB 2-0 CT1 TAPERPNT 27 (SUTURE) ×4 IMPLANT
SYSTEM SAHARA CHEST DRAIN ATS (WOUND CARE) ×2 IMPLANT
TAPE CLOTH SURG 4X10 WHT LF (GAUZE/BANDAGES/DRESSINGS) IMPLANT
TOWEL GREEN STERILE (TOWEL DISPOSABLE) ×2 IMPLANT
TOWEL GREEN STERILE FF (TOWEL DISPOSABLE) ×2 IMPLANT
TRAY FOLEY SLVR 16FR TEMP STAT (SET/KITS/TRAYS/PACK) ×2 IMPLANT
TUBE CONNECTING 20X1/4 (TUBING) IMPLANT
UNDERPAD 30X36 HEAVY ABSORB (UNDERPADS AND DIAPERS) ×2 IMPLANT
VENT LEFT HEART 12002 (CATHETERS) ×4
VRC MALLEABLE SINGLE STG 36FR (MISCELLANEOUS) ×2
WATER STERILE IRR 1000ML POUR (IV SOLUTION) ×4 IMPLANT
YANKAUER SUCT BULB TIP NO VENT (SUCTIONS) IMPLANT

## 2022-04-23 NOTE — Brief Op Note (Signed)
04/23/2022  3:13 PM  PATIENT:  Douglas Waters  71 y.o. male  PRE-OPERATIVE DIAGNOSIS:  SEVERE MR AFIB  POST-OPERATIVE DIAGNOSIS:  SEVERE MR AFIB  PROCEDURE:  MITRAL VALVE REPAIR (MVR) using 53m Simuform ring (N/A) MAZE (N/A) TRANSESOPHAGEAL ECHOCARDIOGRAM (TEE) (N/A)  SURGEON:  Surgeon(s) and Role:  WCoralie Common MD - Primary  PHYSICIAN ASSISTANT: BWynelle BeckmannPA-C  ASSISTANTS: DAlcide EvenerRNFA   ANESTHESIA:   general  EBL:  1080 mL   BLOOD ADMINISTERED:none  DRAINS:  Mediastinal chest tubes    LOCAL MEDICATIONS USED:  NONE  SPECIMEN:  No Specimen  DISPOSITION OF SPECIMEN:  N/A  COUNTS CORRECT:  YES  DICTATION: .Dragon Dictation  PLAN OF CARE: Admit to inpatient   PATIENT DISPOSITION:  ICU - intubated and hemodynamically stable.   Delay start of Pharmacological VTE agent (>24hrs) due to surgical blood loss or risk of bleeding: yes

## 2022-04-23 NOTE — Anesthesia Procedure Notes (Addendum)
Procedure Name: Intubation Date/Time: 04/23/2022 7:55 AM  Performed by: Bryson Corona, CRNAPre-anesthesia Checklist: Patient identified, Emergency Drugs available, Suction available and Patient being monitored Patient Re-evaluated:Patient Re-evaluated prior to induction Oxygen Delivery Method: Circle System Utilized Preoxygenation: Pre-oxygenation with 100% oxygen Induction Type: IV induction Ventilation: Mask ventilation without difficulty Laryngoscope Size: Mac and 4 Grade View: Grade II Tube type: Oral Tube size: 8.0 mm Number of attempts: 1 Airway Equipment and Method: Stylet and Oral airway Placement Confirmation: ETT inserted through vocal cords under direct vision, positive ETCO2 and breath sounds checked- equal and bilateral Secured at: 22 cm Tube secured with: Tape Dental Injury: Teeth and Oropharynx as per pre-operative assessment  Comments: Grade 2a view.

## 2022-04-23 NOTE — Anesthesia Procedure Notes (Signed)
Arterial Line Insertion Start/End11/03/2022 7:43 AM, 04/23/2022 7:53 AM Performed by: Oleta Mouse, MD, anesthesiologist  Patient location: Pre-op. Preanesthetic checklist: patient identified, IV checked, risks and benefits discussed, surgical consent, monitors and equipment checked, pre-op evaluation and anesthesia consent Left, radial was placed Catheter size: 20 G Hand hygiene performed  and maximum sterile barriers used   Attempts: 1 Procedure performed without using ultrasound guided technique. Following insertion, dressing applied and Biopatch. Post procedure assessment: normal and unchanged  Patient tolerated the procedure well with no immediate complications.

## 2022-04-23 NOTE — Progress Notes (Signed)
Vent weaning started

## 2022-04-23 NOTE — Transfer of Care (Signed)
Immediate Anesthesia Transfer of Care Note  Patient: Douglas Waters  Procedure(s) Performed: MITRAL VALVE REPAIR (MVR) using 46m Simuform ring (Chest) MAZE TRANSESOPHAGEAL ECHOCARDIOGRAM (TEE)  Patient Location: ICU  Anesthesia Type:General  Level of Consciousness: Patient remains intubated per anesthesia plan  Airway & Oxygen Therapy: Patient remains intubated per anesthesia plan and Patient placed on Ventilator (see vital sign flow sheet for setting)  Post-op Assessment: Report given to RN and Post -op Vital signs reviewed and stable  Post vital signs: Reviewed and stable  Last Vitals:  Vitals Value Taken Time  BP 110/60   Temp    Pulse 80 04/23/22 1250  Resp 12 04/23/22 1250  SpO2 98 % 04/23/22 1250  Vitals shown include unvalidated device data.  Last Pain:  Vitals:   04/23/22 0621  TempSrc:   PainSc: 6          Complications: No notable events documented.

## 2022-04-23 NOTE — Progress Notes (Signed)
Patient placed on C/PS

## 2022-04-23 NOTE — TOC CM/SW Note (Signed)
.   Transition of Care [facility]) Screening Note   Patient Details  Name: Douglas Waters Date of Birth: [birth year]   Transition of Care [facility]) CM/SW Contact:    Erenest Rasher, RN Phone Number: [phone] 04/23/2022, 3:28 PM    Transition of Care Department [facility]) has reviewed patient. We will continue to monitor patient advancement through interdisciplinary progression rounds. Spoke to wife at bedside. Will continue to follow for dc needs. Daisy, Heart Failure TOC CM [phone]

## 2022-04-23 NOTE — Interval H&P Note (Signed)
History and Physical Interval Note:  04/23/2022 6:26 AM  Douglas Waters  has presented today for surgery, with the diagnosis of SEVERE MR AFIB.  The various methods of treatment have been discussed with the patient and family. After consideration of risks, benefits and other options for treatment, the patient has consented to  Procedure(s): MITRAL VALVE REPAIR or replacement (MVR) (N/A) MAZE (N/A) TRANSESOPHAGEAL ECHOCARDIOGRAM (TEE) (N/A) as a surgical intervention.  The patient's history has been reviewed, patient examined, no change in status, stable for surgery.  I have reviewed the patient's chart and labs.  Questions were answered to the patient's satisfaction.     Coralie Common

## 2022-04-23 NOTE — Progress Notes (Signed)
Patient ID: Douglas Waters, male   DOB: [birth year], 71 y.o.   MRN: [id number]  TCTS Evening Rounds:   Hemodynamically stable  CI = 2.9  Has started to wake up on vent.  Weaning in progress  Urine output good  CT output low  CBC    Component Value Date/Time   WBC 17.5 (H) 04/23/2022 1828   RBC 4.61 04/23/2022 1828   HGB 12.5 (L) 04/23/2022 1828   HGB 14.6 02/19/2022 1134   HGB 13.6 09/19/2013 1105   HCT 38.0 (L) 04/23/2022 1828   HCT 44.1 02/19/2022 1134   HCT 41.1 09/19/2013 1105   PLT 121 (L) 04/23/2022 1828   PLT 227 02/19/2022 1134   MCV 82.4 04/23/2022 1828   MCV 83 02/19/2022 1134   MCV 80.7 09/19/2013 1105   MCH 27.1 04/23/2022 1828   MCHC 32.9 04/23/2022 1828   RDW 12.8 04/23/2022 1828   RDW 13.1 02/19/2022 1134   RDW 13.9 09/19/2013 1105   LYMPHSABS 984 03/22/2022 0926   LYMPHSABS 0.7 (L) 09/19/2013 1105   MONOABS 0.6 04/27/2019 1342   MONOABS 0.4 09/19/2013 1105   EOSABS 684 (H) 03/22/2022 0926   EOSABS 0.2 09/19/2013 1105   BASOSABS 48 03/22/2022 0926   BASOSABS 0.0 09/19/2013 1105     BMET    Component Value Date/Time   NA 139 04/23/2022 1312   NA 141 02/19/2022 1134   NA 140 09/19/2013 1105   K 3.5 04/23/2022 1312   K 3.9 09/19/2013 1105   CL 99 04/23/2022 1148   CL 103 09/20/2012 0953   CO2 23 04/21/2022 1430   CO2 29 09/19/2013 1105   GLUCOSE 155 (H) 04/23/2022 1148   GLUCOSE 83 09/19/2013 1105   GLUCOSE 103 (H) 09/20/2012 0953   BUN 12 04/23/2022 1148   BUN 15 02/19/2022 1134   BUN 11.2 09/19/2013 1105   CREATININE 0.50 (L) 04/23/2022 1148   CREATININE 0.79 03/22/2022 0926   CREATININE 1.0 09/19/2013 1105   CALCIUM 10.0 04/21/2022 1430   CALCIUM 9.7 09/19/2013 1105   EGFR 94 03/22/2022 0926   EGFR 91 02/19/2022 1134   GFRNONAA >60 04/21/2022 1430   GFRNONAA 85 08/17/2019 1041     A/P:  Stable postop course. Continue current plans

## 2022-04-23 NOTE — Op Note (Signed)
CARDIOVASCULAR SURGERY OPERATIVE NOTE  04/23/2022 Douglas Waters [id number]  Surgeon:  Everlena Cooper, MD  First Assistant: Wynelle Beckmann [facility]                               An experienced assistant was required given the complexity of this surgery and the standard of surgical care. The assistant was needed for exposure, dissection, suctioning, retraction of delicate tissues and sutures, instrument exchange and for overall help during this procedure.     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    Complex mitral valve repair using a 34 mm Simuform annuloplasty ring (SN [id number]) 4.   Full Left sided MAZE procedure with pulmonary vein, floor, ceiling, atrial appendage and mitral annular lesions Anesthesia:  General Endotracheal   Clinical History/Surgical Indication:   Severe MR and with normal LV function Pt with NYHA class II symptoms and a class I indication for MV surgery secondary to his symptoms and severity of his regurgitation and it being due to primary MR. He has what appears to primarily Type II regurgitation however he also has Type I and should be repairable.   Preparation:  The patient was seen in the preoperative holding area and the correct patient, correct operation were confirmed with the patient after reviewing the medical record and catheterization. The consent was signed by me. Preoperative antibiotics were given. A pulmonary arterial line and radial arterial line were placed by the anesthesia team. The patient was taken back to the operating room and positioned supine on the operating room table. After being placed under general endotracheal anesthesia by the anesthesia team a foley catheter was placed. The neck, chest, abdomen, and both legs were prepped with betadine soap and  solution and draped in the usual sterile manner. A surgical time-out was taken and the correct patient and operative procedure were confirmed with the nursing and anesthesia staff.   Pre-bypass TEE:   Complete TEE assessment was performed by Dr. Laurie Panda. Findings of moderate MR with some posterior prolapse, normal LV function    Post-bypass TEE: Normal LV function with mild MR central with 88mHg mean gradient  Findings   Media Information     Operation: The patient was brought to the operating room theater and placed on the table in supine position.  After general anesthesia was obtained with use of an endotracheal tube the chest abdomen and legs were prepped and draped in a sterile fashion. Median sternotomy incision was then created the sternum divided with a sternal saw.  Pericardial well was developed and heparin delivered.  The aorta was cannulated with a 23French aortic cannula and a 32French straight cannula was placed in the right atrial appendage and directed towards the inferior vena cava.  With adequate confirmation of anticoagulation cardiopulmonary bypass was instituted.  An additional 28 right angle metal tipped cannula was placed in the superior vena cava for additional venous drainage. At this time the right pulmonary veins were isolated with the AtriCure radiofrequency clamp and 3 sets of lesions were created.  An antegrade cardioplegia  catheter was placed in ascending aorta and with adequate bypass aortic cross-clamp was placed and [place] blood cardioplegia was delivered for 1500 cc.  Reanimation dose was delivered just prior to cross-clamp removal. The left pulmonary veins were then isolated with the AtriCure clamp and 3 sets of lesions were created on the left pulmonary veins.  The left atrial appendage was opened and the clamp advanced down and across the left pulmonary vein and 2 sets of lesions were created with radiofrequency.  The left atrial appendage opening  was controlled with a 4-0 Prolene suture. At this time the left atrium was opened and the atrial groove and adequate exposure of the mitral valve was obtained.  The ceiling and roof lesions of the maze procedure were performed with cryo probe and the mitral annular lesion was also performed with the cryoprobe.  The left atrial appendage was then oversewn from the inside with a running row of 4-0 Prolene sutures. The mitral valve was examined and appeared to have bileaflet prolapse and a series of 3-0 Ethibond sutures were placed circle manually in anticipation of a full ring.  This was measured to a 34 mm ring and the semiformed ring was chosen.  At this time to needle cords of 5 oh cortex were placed in the base of the posterior papillary muscle head and brought out through the field.  The 34 mm annuloplasty ring was then secured with the core knot system. The needle cords were brought out first through the middle of P2 and through the middle of A2 and were adjusted to length.  With saline load test there was good coaptation although there was some still commissural prolapse was controlled with 5-0 Prolene sutures.  This was in the Magic suture fashion.  The saline load test there was excellent coaptation of the leaflets. Left atrium was then closed over ventricular sump patient was placed in a headdown position and the aortic cross-clamp removed.  Ventricular and atrial pacing wires were brought out inferior stab wounds and secured.  With adequate de-airing the left ventricular sump was removed and the patient was weaned from cardiopulmonary bypass on no inotropic support.  With adequate function of the mitral valve protamine was delivered and the patient was decannulated and sites oversewn were necessary.  Should be noted the patient was in atrially ventricularly paced rhythm with good capture of the atrium.  Chest tubes were brought out through inferior stab wounds and secured with adequate hemostasis the  sternum was reapproximated with interrupted stainless steel wire and the presternal subcutaneous tissue and skin were closed multiple absorbable suture.  Was present through 100% of the procedure.

## 2022-04-23 NOTE — Progress Notes (Signed)
NIF -20 VC 1.0 L  Patient extubated per SICU rapid wean protocol.  Patient placed on 4L nasal cannula and tolerating well at this time.  RT will continue to monitor.

## 2022-04-23 NOTE — Hospital Course (Addendum)
History of Present Illness:     Pt is a 71 yo wm who has known SVT and now permanent Atrial fibrillation about the past 2 years who has been feeling more fatigued and with DOE worsening over the past year.He has become more sedentary and has gained over 10lbs. He has no PND nor lower ext edema.  Pt was found to have afib with significant pauses on home monitor felt to possibly need PPM in the future but was also noted to have MR which was felt to be bileaflet prolapse by TTE but on TEE maybe more posterior leaflet related with two jets: one central and one anteriorly directed. Pt has a dilated LA and no other valve pathology. He has normal LV function and on cath has no PHTN nor CAD. He was felt secondary to his symptoms and the severity of his afib to need MV surgery. Pt has had tonlilar cancer treated with RT and has had esophagus dilated in past but was able to have TEE without issues   Hospital Course: Dr. Lavonna Monarch reviewed the patient's chart, labs and diagnostic studies and it was determined surgical intervention would provide the patient the best long term outcome. Dr. Lavonna Monarch reviewed the patient's treatment options as well as the risks and benefits of surgery. Mr. Vasques was agreeable to proceed with mitral valve repair surgery.   Mr. Talent arrived at [facility] and was brought to the operating room on 04/23/22. He underwent a mitral valve repair utilizing a 47m Simuform ring as well as a Maze procedure. He tolerated the procedure well and was transferred to the SICU in stable condition.

## 2022-04-23 NOTE — Anesthesia Procedure Notes (Signed)
Central Venous Catheter Insertion Performed by: Oleta Mouse, MD, anesthesiologist Start/End11/03/2022 7:57 AM, 04/23/2022 8:07 AM Patient location: OR. Preanesthetic checklist: patient identified, IV checked, risks and benefits discussed, surgical consent, monitors and equipment checked, pre-op evaluation, timeout performed and anesthesia consent Hand hygiene performed  and maximum sterile barriers used  PA cath was placed.Swan type:thermodilution Procedure performed without using ultrasound guided technique. Attempts: 1 Patient tolerated the procedure well with no immediate complications.

## 2022-04-23 NOTE — Consult Note (Signed)
NAME:  Douglas Waters, MRN:  [id number], DOB:  05/14/1951, LOS: 0 ADMISSION DATE:  04/23/2022, CONSULTATION DATE:  11/10 REFERRING MD:  Lavonna Monarch, CHIEF COMPLAINT:  post op critical care    History of Present Illness:  71 year old w/ hx as outlined below. Presented to [facility] w/ hx of worsening exertional fatigue over about 1 yr time in setting of severe mitral regurg. Baseline NYHA class II sxs. Went to OR 11/10 where he underwent complex MVR  w/ simeform annuloplasty ring and full left sided MAZE procedure. She returned to the ICU on vent. PCCM asked to assist w/ post op rx   Pertinent  Medical History  Severe MR, atrial fib (was on xarelto), chronic back pain, depression, SVT  Significant Hospital Events: Including procedures, antibiotic start and stop dates in addition to other pertinent events   11/10: Median Sternotomy; Extracorporeal circulation,   Complex mitral valve repair using a 34 mm Simuform annuloplasty ring (SN [id number]). Full Left sided MAZE procedure with pulmonary vein, floor, ceiling, atrial appendage and mitral annular lesions  Interim History / Subjective:  Sedated   Objective   Blood pressure (Abnormal) 153/86, pulse (Abnormal) 56, temperature 98.2 F (36.8 C), temperature source Oral, resp. rate 17, height '5\' 10"'$  (1.778 m), weight 99.8 kg, SpO2 99 %.    Vent Mode: SIMV FiO2 (%):  [50 %] 50 % Set Rate:  [12 bmp] 12 bmp Vt Set:  [580 mL] 580 mL PEEP:  [5 cmH20] 5 cmH20 Pressure Support:  [10 cmH20] 10 cmH20 Plateau Pressure:  [16 cmH20] 16 cmH20   Intake/Output Summary (Last 24 hours) at 04/23/2022 1345 Last data filed at 04/23/2022 1231 Gross per 24 hour  Intake 3302 ml  Output 1450 ml  Net 1852 ml   Filed Weights   04/23/22 0600  Weight: 99.8 kg    Examination: General: Average build HENT: Orally intubated Lungs: Chest clear Cardiovascular: Chest tube output extremities warm. Abdomen: Soft nontender Extremities: No edema warm. Neuro: Sedated with no  response to pain GU: Foley catheter in place  Ancillary tests personally reviewed  Lines and tubes in place. pH 7.3/42/123  Assessment & Plan:  S/p MVR w/ Simuform annuloplasty ring and MAZE procedure post-op day 0 Plan Wean inotropic and vasopressor support per post heart protocol, epinephrine added for low cardiac index.  Volume loading. Tele  Post op care as directed by cardiothoracic surgery  Post op asa Tight glycemic control; weaning insulin gtt when able to take POs PRN pain rx  Need for mechanical ventilation s/p MVR Plan Rapid vent wean protocol  VAP bundle Weaning sedation per protocol   H/o afib now s/p MAZE Currently ventricular paced  Plan Tele  Amiodarone infusion  Lopressor PRn  Holding AC  Chronic pain Plan Cont home rx Was on fent patch and PRN Norco  Chronic anxiety Plan PRN rx  Best Practice (right click and "Reselect all SmartList Selections" daily)   Diet/type: NPO DVT prophylaxis: SCD GI prophylaxis: H2B Lines: Central line and Arterial Line Foley:  Yes, and it is still needed Code Status:  full code Last date of multidisciplinary goals of care discussion [family updated at the bedside]  CRITICAL CARE Performed by: Kipp Brood   Total critical care time: 35 minutes  Critical care time was exclusive of separately billable procedures and treating other patients.  Critical care was necessary to treat or prevent imminent or life-threatening deterioration.  Critical care was time spent personally by me on the following activities:  development of treatment plan with patient and/or surrogate as well as nursing, discussions with consultants, evaluation of patient's response to treatment, examination of patient, obtaining history from patient or surrogate, ordering and performing treatments and interventions, ordering and review of laboratory studies, ordering and review of radiographic studies, pulse oximetry, re-evaluation of patient's  condition and participation in multidisciplinary rounds.  Kipp Brood, MD [facility] ICU Physician [place]  Pager: [phone] Mobile: [phone] After hours: [phone].

## 2022-04-23 NOTE — Discharge Instructions (Signed)
Discharge Instructions:  1. You may shower, please wash incisions daily with soap and water and keep dry.  If you wish to cover wounds with dressing you may do so but please keep clean and change daily.  No tub baths or swimming until incisions have completely healed.  If your incisions become red or develop any drainage please call our office at [phone]  2. No Driving until cleared by Dr. Nicholos Johns office and you are no longer using narcotic pain medications  3. Monitor your weight daily.. Please use the same scale and weigh at same time... If you gain 5-10 lbs in 48 hours with associated lower extremity swelling, please contact our office at [phone]  4. Fever of 101.5 for at least 24 hours with no source, please contact our office at [phone]  5. Activity- up as tolerated, please walk at least 3 times per day.  Avoid strenuous activity, no lifting, pushing, or pulling with your arms over 8-10 lbs for a minimum of 6 weeks  6. If any questions or concerns arise, please do not hesitate to contact our office at [phone]   Information on my medicine - Coumadin   (Warfarin)  Why was Coumadin prescribed for you? Coumadin was prescribed for you because you have a blood clot or a medical condition that can cause an increased risk of forming blood clots. Blood clots can cause serious health problems by blocking the flow of blood to the heart, lung, or brain. Coumadin can prevent harmful blood clots from forming. As a reminder your indication for Coumadin is:  Blood Clot Prevention after Heart Valve Surgery  What test will check on my response to Coumadin? While on Coumadin (warfarin) you will need to have an INR test regularly to ensure that your dose is keeping you in the desired range. The INR (international normalized ratio) number is calculated from the result of the laboratory test called prothrombin time (PT).  If an INR APPOINTMENT HAS NOT ALREADY BEEN MADE FOR YOU please  schedule an appointment to have this lab work done by your health care provider within 7 days. Your INR goal is usually a number between:  2 to 3 or your provider may give you a more narrow range like 2-2.5.  Ask your health care provider during an office visit what your goal INR is.  What  do you need to  know  About  COUMADIN? Take Coumadin (warfarin) exactly as prescribed by your healthcare provider about the same time each day.  DO NOT stop taking without talking to the doctor who prescribed the medication.  Stopping without other blood clot prevention medication to take the place of Coumadin may increase your risk of developing a new clot or stroke.  Get refills before you run out.  What do you do if you miss a dose? If you miss a dose, take it as soon as you remember on the same day then continue your regularly scheduled regimen the next day.  Do not take two doses of Coumadin at the same time.  Important Safety Information A possible side effect of Coumadin (Warfarin) is an increased risk of bleeding. You should call your healthcare provider right away if you experience any of the following: Bleeding from an injury or your nose that does not stop. Unusual colored urine (red or dark brown) or unusual colored stools (red or black). Unusual bruising for unknown reasons. A serious fall or if you hit your head (even if there is no bleeding).  Some foods or medicines interact with Coumadin (warfarin) and might alter your response to warfarin. To help avoid this: Eat a balanced diet, maintaining a consistent amount of Vitamin K. Notify your provider about major diet changes you plan to make. Avoid alcohol or limit your intake to 1 drink for women and 2 drinks for men per day. (1 drink is 5 oz. wine, 12 oz. beer, or 1.5 oz. liquor.)  Make sure that ANY health care provider who prescribes medication for you knows that you are taking Coumadin (warfarin).  Also make sure the healthcare provider  who is monitoring your Coumadin knows when you have started a new medication including herbals and non-prescription products.  Coumadin (Warfarin)  Major Drug Interactions  Increased Warfarin Effect Decreased Warfarin Effect  Alcohol (large quantities) Antibiotics (esp. Septra/Bactrim, Flagyl, Cipro) Amiodarone (Cordarone) Aspirin (ASA) Cimetidine (Tagamet) Megestrol (Megace) NSAIDs (ibuprofen, naproxen, etc.) Piroxicam (Feldene) Propafenone (Rythmol SR) Propranolol (Inderal) Isoniazid (INH) Posaconazole (Noxafil) Barbiturates (Phenobarbital) Carbamazepine (Tegretol) Chlordiazepoxide (Librium) Cholestyramine (Questran) Griseofulvin Oral Contraceptives Rifampin Sucralfate (Carafate) Vitamin K   Coumadin (Warfarin) Major Herbal Interactions  Increased Warfarin Effect Decreased Warfarin Effect  Garlic Ginseng Ginkgo biloba Coenzyme Q10 Green tea St. John's wort    Coumadin (Warfarin) FOOD Interactions  Eat a consistent number of servings per week of foods HIGH in Vitamin K (1 serving =  cup)  Collards (cooked, or boiled & drained) Kale (cooked, or boiled & drained) Mustard greens (cooked, or boiled & drained) Parsley *serving size only =  cup Spinach (cooked, or boiled & drained) Swiss chard (cooked, or boiled & drained) Turnip greens (cooked, or boiled & drained)  Eat a consistent number of servings per week of foods MEDIUM-HIGH in Vitamin K (1 serving = 1 cup)  Asparagus (cooked, or boiled & drained) Broccoli (cooked, boiled & drained, or raw & chopped) Brussel sprouts (cooked, or boiled & drained) *serving size only =  cup Lettuce, raw (green leaf, endive, romaine) Spinach, raw Turnip greens, raw & chopped   These websites have more information on Coumadin (warfarin):  FailFactory.se; VeganReport.com.au;

## 2022-04-23 NOTE — Anesthesia Preprocedure Evaluation (Addendum)
Anesthesia Evaluation  Patient identified by MRN, date of birth, ID band Patient awake    Reviewed: Allergy & Precautions, NPO status , Patient's Chart, lab work & pertinent test results  History of Anesthesia Complications Negative for: history of anesthetic complications  Airway Mallampati: III  TM Distance: >3 FB Neck ROM: Limited    Dental  (+) Dental Advisory Given   Pulmonary shortness of breath, sleep apnea , neg COPD, neg PE   breath sounds clear to auscultation       Cardiovascular hypertension, Pt. on medications (-) angina (-) Past MI + Valvular Problems/Murmurs MR  Rhythm:Regular Rate:Normal  1. Left ventricular ejection fraction, by estimation, is 60 to 65%. The  left ventricle has normal function. There is severe left ventricular  hypertrophy.   2. Right ventricular systolic function is normal. The right ventricular  size is normal.   3. Left atrial size was severely dilated. No left atrial/left atrial  appendage thrombus was detected.   4. There may be a flail subvalvular cord to the posterior mitral leaflet  with prolaps of the P2 segment. MR radius is 0.7 cm - MR EROA is 0.35 cm2  with MR volume of 43 ml (suboptimal angle of incidence due to splay). The  mitral valve is degenerative.  Severe mitral valve regurgitation. There is moderate late systolic  prolapse of the middle scallop of the posterior leaflet of the mitral  valve.   5. The aortic valve is tricuspid. Aortic valve regurgitation is not  visualized.   1. Mild luminal irregularities noted in the LAD and RCA.  No obstructive coronary artery disease is present. 2. Normal left and right heart filling pressures. 3. Mild pulmonary hypertension with prominent V-waves and PCWP tracing. 4. Normal Fick cardiac output/index.   Recommendations: 1. Continue workup of mitral regurgitation per Dr. Lovena Le and cardiac surgery.   Nelva Bush, MD     Neuro/Psych  PSYCHIATRIC DISORDERS Anxiety Depression    negative neurological ROS     GI/Hepatic negative GI ROS, Neg liver ROS,,,  Endo/Other  negative endocrine ROS    Renal/GU negative Renal ROS     Musculoskeletal  (+) Arthritis ,    Abdominal   Peds  Hematology negative hematology ROS (+) Lab Results      Component                Value               Date                      WBC                      7.0                 04/21/2022                HGB                      14.8                04/21/2022                HCT                      45.7                04/21/2022  MCV                      82.8                04/21/2022                PLT                      206                 04/21/2022              Anesthesia Other Findings  History of radiation therapy 03/02/10-04/16/10 left tonsil, has dry mouth can not taste anymore  Reproductive/Obstetrics                             Anesthesia Physical Anesthesia Plan  ASA: 4  Anesthesia Plan: General   Post-op Pain Management:    Induction: Intravenous  PONV Risk Score and Plan: 2 and Treatment may vary due to age or medical condition and Ondansetron  Airway Management Planned: Oral ETT  Additional Equipment: Arterial line, CVP, PA Cath, TEE, Ultrasound Guidance Line Placement and 3D TEE  Intra-op Plan:   Post-operative Plan: Post-operative intubation/ventilation  Informed Consent: I have reviewed the patients History and Physical, chart, labs and discussed the procedure including the risks, benefits and alternatives for the proposed anesthesia with the patient or authorized representative who has indicated his/her understanding and acceptance.     Dental advisory given  Plan Discussed with: CRNA  Anesthesia Plan Comments:         Anesthesia Quick Evaluation

## 2022-04-23 NOTE — Anesthesia Procedure Notes (Signed)
Central Venous Catheter Insertion Performed by: Oleta Mouse, MD, anesthesiologist Start/End11/03/2022 7:57 AM, 04/23/2022 8:07 AM Patient location: OR. Preanesthetic checklist: patient identified, IV checked, risks and benefits discussed, surgical consent, monitors and equipment checked, pre-op evaluation, timeout performed and anesthesia consent Position: supine Patient sedated Hand hygiene performed  and maximum sterile barriers used  Catheter size: 8.5 Fr Sheath introducer Procedure performed using ultrasound guided technique. Ultrasound Notes:anatomy identified, needle tip was noted to be adjacent to the nerve/plexus identified, no ultrasound evidence of intravascular and/or intraneural injection and image(s) printed for medical record Attempts: 1 Following insertion, line sutured, dressing applied and Biopatch. Post procedure assessment: blood return through all ports, free fluid flow and no air  Patient tolerated the procedure well with no immediate complications.

## 2022-04-24 ENCOUNTER — Inpatient Hospital Stay (HOSPITAL_COMMUNITY): Payer: Medicare Other

## 2022-04-24 LAB — BASIC METABOLIC PANEL
Anion gap: 6 (ref 5–15)
Anion gap: 9 (ref 5–15)
BUN: 12 mg/dL (ref 8–23)
BUN: 13 mg/dL (ref 8–23)
CO2: 24 mmol/L (ref 22–32)
CO2: 27 mmol/L (ref 22–32)
Calcium: 8.5 mg/dL — ABNORMAL LOW (ref 8.9–10.3)
Calcium: 8.9 mg/dL (ref 8.9–10.3)
Chloride: 105 mmol/L (ref 98–111)
Chloride: 98 mmol/L (ref 98–111)
Creatinine, Ser: 0.72 mg/dL (ref 0.61–1.24)
Creatinine, Ser: 0.83 mg/dL (ref 0.61–1.24)
GFR, Estimated: >60 mL/min (ref >60)
GFR, Estimated: >60 mL/min (ref >60)
Glucose, Bld: 106 mg/dL — ABNORMAL HIGH (ref 70–99)
Glucose, Bld: 129 mg/dL — ABNORMAL HIGH (ref 70–99)
Potassium: 3.7 mmol/L (ref 3.5–5.1)
Potassium: 3.8 mmol/L (ref 3.5–5.1)
Sodium: 135 mmol/L (ref 135–145)
Sodium: 134 mmol/L — ABNORMAL LOW (ref 135–145)

## 2022-04-24 LAB — GLUCOSE, CAPILLARY
Glucose-Capillary: 103 mg/dL — ABNORMAL HIGH (ref 70–99)
Glucose-Capillary: 107 mg/dL — ABNORMAL HIGH (ref 70–99)
Glucose-Capillary: 115 mg/dL — ABNORMAL HIGH (ref 70–99)
Glucose-Capillary: 118 mg/dL — ABNORMAL HIGH (ref 70–99)
Glucose-Capillary: 118 mg/dL — ABNORMAL HIGH (ref 70–99)
Glucose-Capillary: 122 mg/dL — ABNORMAL HIGH (ref 70–99)
Glucose-Capillary: 125 mg/dL — ABNORMAL HIGH (ref 70–99)
Glucose-Capillary: 137 mg/dL — ABNORMAL HIGH (ref 70–99)
Glucose-Capillary: 140 mg/dL — ABNORMAL HIGH (ref 70–99)

## 2022-04-24 LAB — CBC
HCT: 33.4 % — ABNORMAL LOW (ref 39.0–52.0)
HCT: 36.8 % — ABNORMAL LOW (ref 39.0–52.0)
Hemoglobin: 11.4 g/dL — ABNORMAL LOW (ref 13.0–17.0)
Hemoglobin: 12.2 g/dL — ABNORMAL LOW (ref 13.0–17.0)
MCH: 27.6 pg (ref 26.0–34.0)
MCH: 27.2 pg (ref 26.0–34.0)
MCHC: 34.1 g/dL (ref 30.0–36.0)
MCHC: 33.2 g/dL (ref 30.0–36.0)
MCV: 80.9 fL (ref 80.0–100.0)
MCV: 82 fL (ref 80.0–100.0)
Platelets: 86 10*3/uL — ABNORMAL LOW (ref 150–400)
Platelets: 115 10*3/uL — ABNORMAL LOW (ref 150–400)
RBC: 4.13 MIL/uL — ABNORMAL LOW (ref 4.22–5.81)
RBC: 4.49 MIL/uL (ref 4.22–5.81)
RDW: 13.2 % (ref 11.5–15.5)
RDW: 13.3 % (ref 11.5–15.5)
WBC: 13.4 10*3/uL — ABNORMAL HIGH (ref 4.0–10.5)
WBC: 22.4 10*3/uL — ABNORMAL HIGH (ref 4.0–10.5)
nRBC: 0 % (ref 0.0–0.2)
nRBC: 0 % (ref 0.0–0.2)

## 2022-04-24 LAB — MAGNESIUM
Magnesium: 1.9 mg/dL (ref 1.7–2.4)
Magnesium: 1.8 mg/dL (ref 1.7–2.4)

## 2022-04-24 MED ORDER — WARFARIN SODIUM 2.5 MG PO TABS
2.5000 mg | ORAL_TABLET | Freq: Every day | ORAL | Status: DC
  Administered 2022-04-24: 2.5 mg via ORAL
  Filled 2022-04-24: qty 1

## 2022-04-24 MED ORDER — METOLAZONE 2.5 MG PO TABS
2.5000 mg | ORAL_TABLET | Freq: Once | ORAL | Status: AC
  Administered 2022-04-24: 2.5 mg via ORAL
  Filled 2022-04-24: qty 1

## 2022-04-24 MED ORDER — POTASSIUM CHLORIDE CRYS ER 20 MEQ PO TBCR
20.0000 meq | EXTENDED_RELEASE_TABLET | ORAL | Status: AC
  Administered 2022-04-24 (×3): 20 meq via ORAL
  Filled 2022-04-24 (×3): qty 1

## 2022-04-24 MED ORDER — POTASSIUM CHLORIDE CRYS ER 20 MEQ PO TBCR
40.0000 meq | EXTENDED_RELEASE_TABLET | Freq: Once | ORAL | Status: AC
  Administered 2022-04-24: 40 meq via ORAL
  Filled 2022-04-24: qty 2

## 2022-04-24 MED ORDER — FUROSEMIDE 10 MG/ML IJ SOLN
40.0000 mg | Freq: Two times a day (BID) | INTRAMUSCULAR | Status: AC
  Administered 2022-04-24 – 2022-04-25 (×2): 40 mg via INTRAVENOUS
  Filled 2022-04-24 (×2): qty 4

## 2022-04-24 MED ORDER — WARFARIN - PHYSICIAN DOSING INPATIENT
Freq: Every day | Status: DC
  Administered 2022-04-24: 1

## 2022-04-24 NOTE — Anesthesia Postprocedure Evaluation (Signed)
Anesthesia Post Note  Patient: Jeovany Huitron Tangonan  Procedure(s) Performed: MITRAL VALVE REPAIR (MVR) using 51m Simuform ring (Chest) MAZE TRANSESOPHAGEAL ECHOCARDIOGRAM (TEE)     Patient location during evaluation: SICU Anesthesia Type: General Level of consciousness: sedated Pain management: pain level controlled Vital Signs Assessment: post-procedure vital signs reviewed and stable Respiratory status: patient remains intubated per anesthesia plan Cardiovascular status: stable Postop Assessment: no apparent nausea or vomiting Anesthetic complications: no   No notable events documented.  Last Vitals:  Vitals:   04/24/22 0900 04/24/22 0909  BP: (!) 145/75   Pulse: 84 84  Resp: (!) 21 17  Temp: 37 C 37 C  SpO2: 96% 96%    Last Pain:  Vitals:   04/24/22 0909  TempSrc:   PainSc: 8                  Johncarlo Maalouf

## 2022-04-24 NOTE — Progress Notes (Addendum)
1 Day Post-Op Procedure(s) (LRB): MITRAL VALVE REPAIR (MVR) using 54m Simuform ring (N/A) MAZE (N/A) TRANSESOPHAGEAL ECHOCARDIOGRAM (TEE) (N/A) Subjective: No complaints.  Objective: Vital signs in last 24 hours: Temp:  [95.2 F (35.1 C)-99.7 F (37.6 C)] 98.6 F (37 C) (11/11 0909) Pulse Rate:  [80-115] 84 (11/11 0909) Cardiac Rhythm: A-V Sequential paced (11/11 0800) Resp:  [12-32] 17 (11/11 0909) BP: (97-145)/(52-80) 145/75 (11/11 0900) SpO2:  [91 %-100 %] 96 % (11/11 0909) Arterial Line BP: (85-163)/(42-72) 163/68 (11/11 0909) FiO2 (%):  [40 %-50 %] 40 % (11/10 1928) Weight:  [103.6 kg] 103.6 kg (11/11 0500)  Hemodynamic parameters for last 24 hours: PAP: (22-51)/(8-24) 43/14 CO:  [3.3 L/min-7.3 L/min] 4.9 L/min CI:  [1.5 L/min/m2-3.4 L/min/m2] 2.3 L/min/m2  Intake/Output from previous day: 11/10 0701 - 11/11 0700 In: 6168.1 [I.V.:3982.8; Blood:702; IV Piggyback:1483.3] Out: 37408[Urine:1407; Emesis/NG output:10; Blood:1080; Chest Tube:800] Intake/Output this shift: Total I/O In: 103.8 [I.V.:103.8] Out: 80 [Chest Tube:80]  General appearance: alert and cooperative Neurologic: intact Heart: regular rate and rhythm, S1, S2 normal, no murmur Lungs: diminished breath sounds bibasilar Extremities: edema mild Wound: dressing dry  Lab Results: Recent Labs    04/23/22 1828 04/23/22 1951 04/23/22 2105 04/24/22 0453  WBC 17.5*  --   --  13.4*  HGB 12.5*   < > 12.2* 11.4*  HCT 38.0*   < > 36.0* 33.4*  PLT 121*  --   --  86*   < > = values in this interval not displayed.   BMET:  Recent Labs    04/23/22 1828 04/23/22 1951 04/23/22 2105 04/24/22 0453  NA 139   < > 139 135  K 3.4*   < > 3.9 3.7  CL 106  --   --  105  CO2 21*  --   --  24  GLUCOSE 147*  --   --  106*  BUN 11  --   --  12  CREATININE 0.98  --   --  0.72  CALCIUM 8.6*  --   --  8.5*   < > = values in this interval not displayed.    PT/INR:  Recent Labs    04/23/22 1302  LABPROT 16.2*   INR 1.3*   ABG    Component Value Date/Time   PHART 7.360 04/23/2022 2105   HCO3 19.0 (L) 04/23/2022 2105   TCO2 20 (L) 04/23/2022 2105   ACIDBASEDEF 6.0 (H) 04/23/2022 2105   O2SAT 95 04/23/2022 2105   CBG (last 3)  Recent Labs    04/24/22 0555 04/24/22 0655 04/24/22 0832  GLUCAP 107* 118* 118*   CXR: left basilar atelectasis  ECG: sinus with AV dissociation and vent rate 45. CHB.  Assessment/Plan: S/P Procedure(s) (LRB): MITRAL VALVE REPAIR (MVR) using 327mSimuform ring (N/A) MAZE (N/A) TRANSESOPHAGEAL ECHOCARDIOGRAM (TEE) (N/A)  POD 1 Hemodynamically stable. Rhythm is CHB with vent rate 45 on IV amio. Will discuss whether to continue amio for now and pace or stop it and follow rhythm. No Lopressor.  Chest tube output 410 over night and 80 since 7 am. It is thin serosanguinous. Will keep tubes in this morning and probably remove later today.   DC swan, arterial line.  Volume excess: wt is 8 lbs over preop. Start diuresis.  Glucose under good control with no hx of DM. Stop insulin.  Thrombocytopenia: Hold off on Lovenox.  Start Coumadin slowly today for MV repair and MAZE/atrial fib.  IS, OOB, mobilize.   LOS: 1 day  Gaye Pollack 04/24/2022  Addendum: discussed with PWW  and we will DC amio and follow rhythm.

## 2022-04-24 NOTE — Progress Notes (Signed)
Patient ID: Douglas Waters, male   DOB: [birth year], 71 y.o.   MRN: [id number] TCTS Evening Round:  Hemodynamically stable in sinus rhythm.  Chest tube output low. Will remove.  Did not diurese much with 40 mg lasix IV. Will give a dose of metolazone this evening with lasix. Creat normal.  BMET    Component Value Date/Time   NA 134 (L) 04/24/2022 1652   NA 141 02/19/2022 1134   NA 140 09/19/2013 1105   K 3.8 04/24/2022 1652   K 3.9 09/19/2013 1105   CL 98 04/24/2022 1652   CL 103 09/20/2012 0953   CO2 27 04/24/2022 1652   CO2 29 09/19/2013 1105   GLUCOSE 129 (H) 04/24/2022 1652   GLUCOSE 83 09/19/2013 1105   GLUCOSE 103 (H) 09/20/2012 0953   BUN 13 04/24/2022 1652   BUN 15 02/19/2022 1134   BUN 11.2 09/19/2013 1105   CREATININE 0.83 04/24/2022 1652   CREATININE 0.79 03/22/2022 0926   CREATININE 1.0 09/19/2013 1105   CALCIUM 8.9 04/24/2022 1652   CALCIUM 9.7 09/19/2013 1105   EGFR 94 03/22/2022 0926   EGFR 91 02/19/2022 1134   GFRNONAA >60 04/24/2022 1652   GFRNONAA 85 08/17/2019 1041

## 2022-04-25 ENCOUNTER — Inpatient Hospital Stay (HOSPITAL_COMMUNITY): Payer: Medicare Other

## 2022-04-25 LAB — PROTIME-INR
INR: 1.3 — ABNORMAL HIGH (ref 0.8–1.2)
Prothrombin Time: 15.8 seconds — ABNORMAL HIGH (ref 11.4–15.2)

## 2022-04-25 LAB — CBC
HCT: 34.8 % — ABNORMAL LOW (ref 39.0–52.0)
Hemoglobin: 11.7 g/dL — ABNORMAL LOW (ref 13.0–17.0)
MCH: 27.3 pg (ref 26.0–34.0)
MCHC: 33.6 g/dL (ref 30.0–36.0)
MCV: 81.3 fL (ref 80.0–100.0)
Platelets: 90 10*3/uL — ABNORMAL LOW (ref 150–400)
RBC: 4.28 MIL/uL (ref 4.22–5.81)
RDW: 13.4 % (ref 11.5–15.5)
WBC: 17.8 10*3/uL — ABNORMAL HIGH (ref 4.0–10.5)
nRBC: 0 % (ref 0.0–0.2)

## 2022-04-25 LAB — BASIC METABOLIC PANEL
Anion gap: 10 (ref 5–15)
BUN: 11 mg/dL (ref 8–23)
CO2: 32 mmol/L (ref 22–32)
Calcium: 9.3 mg/dL (ref 8.9–10.3)
Chloride: 91 mmol/L — ABNORMAL LOW (ref 98–111)
Creatinine, Ser: 0.8 mg/dL (ref 0.61–1.24)
GFR, Estimated: >60 mL/min (ref >60)
Glucose, Bld: 115 mg/dL — ABNORMAL HIGH (ref 70–99)
Potassium: 3.4 mmol/L — ABNORMAL LOW (ref 3.5–5.1)
Sodium: 133 mmol/L — ABNORMAL LOW (ref 135–145)

## 2022-04-25 LAB — GLUCOSE, CAPILLARY: Glucose-Capillary: 100 mg/dL — ABNORMAL HIGH (ref 70–99)

## 2022-04-25 LAB — ECHO INTRAOPERATIVE TEE
Height: 70 in
Weight: 3520 oz

## 2022-04-25 MED ORDER — SODIUM CHLORIDE 0.9 % IV SOLN: 250.0000 mL | INTRAVENOUS | Status: DC | PRN

## 2022-04-25 MED ORDER — METOLAZONE 2.5 MG PO TABS
2.5000 mg | ORAL_TABLET | Freq: Once | ORAL | Status: AC
  Administered 2022-04-25: 2.5 mg via ORAL
  Filled 2022-04-25: qty 1

## 2022-04-25 MED ORDER — SODIUM CHLORIDE 0.9% FLUSH
3.0000 mL | Freq: Two times a day (BID) | INTRAVENOUS | Status: DC
  Administered 2022-04-25 – 2022-04-29 (×9): 3 mL via INTRAVENOUS

## 2022-04-25 MED ORDER — FUROSEMIDE 40 MG PO TABS
40.0000 mg | ORAL_TABLET | Freq: Once | ORAL | Status: AC
  Administered 2022-04-25: 40 mg via ORAL
  Filled 2022-04-25: qty 1

## 2022-04-25 MED ORDER — POTASSIUM CHLORIDE CRYS ER 20 MEQ PO TBCR
20.0000 meq | EXTENDED_RELEASE_TABLET | Freq: Once | ORAL | Status: AC
  Administered 2022-04-25: 20 meq via ORAL
  Filled 2022-04-25: qty 1

## 2022-04-25 MED ORDER — CLONAZEPAM 0.5 MG PO TABS
0.5000 mg | ORAL_TABLET | Freq: Two times a day (BID) | ORAL | Status: DC | PRN
  Administered 2022-04-27 – 2022-04-29 (×2): 0.5 mg via ORAL
  Filled 2022-04-25 (×2): qty 1

## 2022-04-25 MED ORDER — ASPIRIN 81 MG PO TBEC
81.0000 mg | DELAYED_RELEASE_TABLET | Freq: Every day | ORAL | Status: DC
  Administered 2022-04-26 – 2022-04-29 (×4): 81 mg via ORAL
  Filled 2022-04-25 (×4): qty 1

## 2022-04-25 MED ORDER — SENNOSIDES-DOCUSATE SODIUM 8.6-50 MG PO TABS
1.0000 | ORAL_TABLET | Freq: Two times a day (BID) | ORAL | Status: DC | PRN
  Administered 2022-04-27 – 2022-04-28 (×2): 1 via ORAL
  Filled 2022-04-25 (×2): qty 1

## 2022-04-25 MED ORDER — SODIUM CHLORIDE 0.9% FLUSH: 3.0000 mL | INTRAVENOUS | Status: DC | PRN

## 2022-04-25 MED ORDER — POTASSIUM CHLORIDE CRYS ER 20 MEQ PO TBCR
40.0000 meq | EXTENDED_RELEASE_TABLET | Freq: Once | ORAL | Status: AC
  Administered 2022-04-25: 40 meq via ORAL
  Filled 2022-04-25: qty 2

## 2022-04-25 MED ORDER — ~~LOC~~ CARDIAC SURGERY, PATIENT & FAMILY EDUCATION: Freq: Once | Status: AC

## 2022-04-25 MED ORDER — WARFARIN SODIUM 5 MG PO TABS
5.0000 mg | ORAL_TABLET | Freq: Once | ORAL | Status: AC
  Administered 2022-04-25: 5 mg via ORAL
  Filled 2022-04-25: qty 1

## 2022-04-25 MED ORDER — FENTANYL 25 MCG/HR TD PT72
1.0000 | MEDICATED_PATCH | TRANSDERMAL | Status: DC
  Administered 2022-04-25 – 2022-04-28 (×2): 1 via TRANSDERMAL
  Filled 2022-04-25 (×2): qty 1

## 2022-04-25 NOTE — Progress Notes (Signed)
Patient arrived at the unit,CCMD notified,vitals checked.patient oriented at the unit,Denies pain.care continues.

## 2022-04-25 NOTE — Progress Notes (Signed)
2 Days Post-Op Procedure(s) (LRB): MITRAL VALVE REPAIR (MVR) using 26m Simuform ring (N/A) MAZE (N/A) TRANSESOPHAGEAL ECHOCARDIOGRAM (TEE) (N/A) Subjective: Feels better today. Ambulated around the ICU 3 laps.  Objective: Vital signs in last 24 hours: Temp:  [97.6 F (36.4 C)-100.2 F (37.9 C)] 99 F (37.2 C) (11/12 1000) Pulse Rate:  [73-86] 82 (11/12 1000) Cardiac Rhythm: A-V Sequential paced (11/12 0854) Resp:  [15-29] 15 (11/12 1000) BP: (108-164)/(58-107) 145/74 (11/12 1000) SpO2:  [92 %-98 %] 94 % (11/12 1000) Arterial Line BP: (171)/(75) 171/75 (11/11 1049)  Hemodynamic parameters for last 24 hours: PAP: (49)/(23) 49/23  Intake/Output from previous day: 11/11 0701 - 11/12 0700 In: 736.7 [P.O.:360; I.V.:276.6; IV Piggyback:100.1] Out: 4330 [Urine:4250; Chest Tube:80] Intake/Output this shift: Total I/O In: 598.8 [P.O.:120; I.V.:278.8; IV Piggyback:200] Out: 500 [Urine:500]  General appearance: alert and cooperative Neurologic: intact Heart: regular rate and rhythm, S1, S2 normal, no murmur Lungs: clear to auscultation bilaterally Extremities: edema minimal Wound: dressing dry  Lab Results: Recent Labs    04/24/22 1652 04/25/22 0457  WBC 22.4* 17.8*  HGB 12.2* 11.7*  HCT 36.8* 34.8*  PLT 115* 90*   BMET:  Recent Labs    04/24/22 1652 04/25/22 0457  NA 134* 133*  K 3.8 3.4*  CL 98 91*  CO2 27 32  GLUCOSE 129* 115*  BUN 13 11  CREATININE 0.83 0.80  CALCIUM 8.9 9.3    PT/INR:  Recent Labs    04/25/22 0457  LABPROT 15.8*  INR 1.3*   ABG    Component Value Date/Time   PHART 7.360 04/23/2022 2105   HCO3 19.0 (L) 04/23/2022 2105   TCO2 20 (L) 04/23/2022 2105   ACIDBASEDEF 6.0 (H) 04/23/2022 2105   O2SAT 95 04/23/2022 2105   CBG (last 3)  Recent Labs    04/24/22 0832 04/24/22 1151 04/24/22 1655  GLUCAP 118* 115* 140*   CXR: small left pleural effusion and left basilar atelectasis  Assessment/Plan: S/P Procedure(s) (LRB): MITRAL  VALVE REPAIR (MVR) using 357mSimuform ring (N/A) MAZE (N/A) TRANSESOPHAGEAL ECHOCARDIOGRAM (TEE) (N/A)  POD 2 Hemodynamically stable.  Rhythm under pacer looks like sinus with CHB with rate 40's to 50. Amio stopped yesterday. Will continue AV pacing and observe. He will not atrial pace. May need PPM. Repeat ECG tomorrow. No beta blocker.  Volume excess: -3600 cc yesterday. No wt yet today. Will give a dose of diuretic today and KCL.  INR did not bump after 2.5 of Coumadin yesterday. Will give 5 mg today. Keep INR< 2 in case PPM needed.  Remove sleeve. Transfer to 4E and continue mobilization, IS  Pain management: seems better today. He was on fentanyl patch and oral narcotics at home for back according to wife.  LOS: 2 days    Douglas Pollack1/05/2022

## 2022-04-26 ENCOUNTER — Encounter (HOSPITAL_COMMUNITY): Payer: Self-pay | Admitting: Thoracic Surgery (Cardiothoracic Vascular Surgery)

## 2022-04-26 ENCOUNTER — Inpatient Hospital Stay (HOSPITAL_COMMUNITY): Payer: Medicare Other

## 2022-04-26 DIAGNOSIS — Z9889 Other specified postprocedural states: Secondary | ICD-10-CM

## 2022-04-26 DIAGNOSIS — I4891 Unspecified atrial fibrillation: Secondary | ICD-10-CM

## 2022-04-26 LAB — CBC
HCT: 36.2 % — ABNORMAL LOW (ref 39.0–52.0)
Hemoglobin: 12.6 g/dL — ABNORMAL LOW (ref 13.0–17.0)
MCH: 27.9 pg (ref 26.0–34.0)
MCHC: 34.8 g/dL (ref 30.0–36.0)
MCV: 80.1 fL (ref 80.0–100.0)
Platelets: 102 10*3/uL — ABNORMAL LOW (ref 150–400)
RBC: 4.52 MIL/uL (ref 4.22–5.81)
RDW: 13 % (ref 11.5–15.5)
WBC: 12.4 10*3/uL — ABNORMAL HIGH (ref 4.0–10.5)
nRBC: 0 % (ref 0.0–0.2)

## 2022-04-26 LAB — PROTIME-INR
INR: 1.6 — ABNORMAL HIGH (ref 0.8–1.2)
Prothrombin Time: 19.2 seconds — ABNORMAL HIGH (ref 11.4–15.2)

## 2022-04-26 LAB — BASIC METABOLIC PANEL
Anion gap: 11 (ref 5–15)
BUN: 14 mg/dL (ref 8–23)
CO2: 37 mmol/L — ABNORMAL HIGH (ref 22–32)
Calcium: 9.5 mg/dL (ref 8.9–10.3)
Chloride: 87 mmol/L — ABNORMAL LOW (ref 98–111)
Creatinine, Ser: 0.99 mg/dL (ref 0.61–1.24)
GFR, Estimated: >60 mL/min (ref >60)
Glucose, Bld: 107 mg/dL — ABNORMAL HIGH (ref 70–99)
Potassium: 2.9 mmol/L — ABNORMAL LOW (ref 3.5–5.1)
Sodium: 135 mmol/L (ref 135–145)

## 2022-04-26 LAB — GLUCOSE, CAPILLARY
Glucose-Capillary: 117 mg/dL — ABNORMAL HIGH (ref 70–99)
Glucose-Capillary: 127 mg/dL — ABNORMAL HIGH (ref 70–99)
Glucose-Capillary: 148 mg/dL — ABNORMAL HIGH (ref 70–99)
Glucose-Capillary: 118 mg/dL — ABNORMAL HIGH (ref 70–99)

## 2022-04-26 LAB — MAGNESIUM: Magnesium: 1.7 mg/dL (ref 1.7–2.4)

## 2022-04-26 MED ORDER — BUMETANIDE 2 MG PO TABS
2.0000 mg | ORAL_TABLET | Freq: Two times a day (BID) | ORAL | Status: DC
  Administered 2022-04-26 – 2022-04-27 (×3): 2 mg via ORAL
  Filled 2022-04-26 (×3): qty 1

## 2022-04-26 MED ORDER — POTASSIUM CHLORIDE CRYS ER 20 MEQ PO TBCR
20.0000 meq | EXTENDED_RELEASE_TABLET | Freq: Two times a day (BID) | ORAL | Status: DC
  Administered 2022-04-26 – 2022-04-27 (×4): 20 meq via ORAL
  Filled 2022-04-26 (×4): qty 1

## 2022-04-26 MED ORDER — MAGNESIUM SULFATE 2 GM/50ML IV SOLN
2.0000 g | Freq: Once | INTRAVENOUS | Status: AC
  Administered 2022-04-26: 2 g via INTRAVENOUS
  Filled 2022-04-26: qty 50

## 2022-04-26 MED ORDER — WARFARIN SODIUM 5 MG PO TABS
5.0000 mg | ORAL_TABLET | Freq: Once | ORAL | Status: AC
  Administered 2022-04-26: 5 mg via ORAL
  Filled 2022-04-26: qty 1

## 2022-04-26 MED ORDER — POTASSIUM CHLORIDE CRYS ER 20 MEQ PO TBCR
40.0000 meq | EXTENDED_RELEASE_TABLET | Freq: Once | ORAL | Status: AC
  Administered 2022-04-26: 40 meq via ORAL
  Filled 2022-04-26: qty 2

## 2022-04-26 NOTE — Care Management Important Message (Signed)
Important Message  Patient Details  Name: Douglas Waters MRN: [id number] Date of Birth: 06-22-1950   Medicare Important Message Given:        Orbie Pyo 04/26/2022, 3:36 PM

## 2022-04-26 NOTE — Progress Notes (Signed)
Patient presented to IR for possible left thoracentesis. Limited US evaluation revealed only a small pocket of fluid. Patient is asymptomatic. Patient given the option to have IR drain what it could or to continue with conservative management.   Patient elected to continue with conservative management. IR recommends repeat imaging if patient shows signs of increasing effusion.   Images saved in Epic. Dr. Dwaine Gale and Enid Cutter, PA-C made aware.   Douglas Waters, Douglas Waters [phone] 04/26/2022, 2:44 PM

## 2022-04-26 NOTE — Care Management Important Message (Signed)
Important Message  Patient Details  Name: Douglas Waters MRN: [id number] Date of Birth: Nov 28, 1950   Medicare Important Message Given:  Yes     Gerrie Castiglia Montine Circle 04/26/2022, 3:36 PM
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
## 2022-04-26 NOTE — Care Management Important Message (Signed)
Important Message  Patient Details  Name: Douglas Waters MRN: [id number] Date of Birth: 1951/05/25   Medicare Important Message Given:  Yes     Memory Argue 04/26/2022, 3:44 PM

## 2022-04-26 NOTE — Progress Notes (Signed)
CARDIAC REHAB PHASE I     Pt in bed resting, feeling well this morning. Ambulated with mobility tolerating well with c/o of fatigue.  Encouraged continued ambulation, oob to chair and IS use. Pain is tolerable today. Discussed move in the tun and sternal precautions. Will return later today for walk as time permits. Will continue to follow.   [phone]  Vanessa Barbara, RN BSN 04/26/2022 10:31 AM

## 2022-04-26 NOTE — Progress Notes (Signed)
Mobility Specialist Progress Note:   04/26/22 0926  Mobility  Activity Ambulated with assistance in hallway  Level of Assistance Standby assist, set-up cues, supervision of patient - no hands on  Assistive Device Front wheel walker  Distance Ambulated (ft) 70 ft  Activity Response Tolerated well  Mobility Referral Yes  $Mobility charge 1 Mobility   Pt received in bathroom willing to participate in mobility. No complaints of pain. Left in bed with call bell in reach and all needs met.  [employer] [place] [employer] [place] [employer], mining Chat only

## 2022-04-26 NOTE — Progress Notes (Signed)
      [address]       [place] [address]             [phone]      3 Days Post-Op Procedure(s) (LRB): MITRAL VALVE REPAIR (MVR) using 65m Simuform ring (N/A) MAZE (N/A) TRANSESOPHAGEAL ECHOCARDIOGRAM (TEE) (N/A) Subjective: Patient feels well today, states he has no pain just some chest soreness. No new complaints.  Objective: Vital signs in last 24 hours: Temp:  [98.4 F (36.9 C)-100 F (37.8 C)] 98.5 F (36.9 C) (11/13 0345) Pulse Rate:  [80-88] 81 (11/13 0345) Cardiac Rhythm: A-V Sequential paced (11/12 2100) Resp:  [15-21] 18 (11/13 0345) BP: (117-145)/(63-107) 118/71 (11/13 0345) SpO2:  [88 %-98 %] 94 % (11/13 0345) Weight:  [101.2 kg] 101.2 kg (11/13 0500)  Hemodynamic parameters for last 24 hours:    Intake/Output from previous day: 11/12 0701 - 11/13 0700 In: 781.8 [P.O.:300; I.V.:281.8; IV Piggyback:200] Out: 1200 [Urine:1200] Intake/Output this shift: No intake/output data recorded.  General appearance: alert, cooperative, and no distress Neurologic: intact Heart: AV paced, no murmur, rub or gallop Lungs: clear to auscultation bilaterally Abdomen: soft, non-tender; bowel sounds normal; no masses,  no organomegaly Extremities: extremities normal, atraumatic, no cyanosis or edema Wound: Clean and dry dressing  Lab Results: Recent Labs    04/25/22 0457 04/26/22 0109  WBC 17.8* 12.4*  HGB 11.7* 12.6*  HCT 34.8* 36.2*  PLT 90* 102*   BMET:  Recent Labs    04/25/22 0457 04/26/22 0109  NA 133* 135  K 3.4* 2.9*  CL 91* 87*  CO2 32 37*  GLUCOSE 115* 107*  BUN 11 14  CREATININE 0.80 0.99  CALCIUM 9.3 9.5    PT/INR:  Recent Labs    04/26/22 0109  LABPROT 19.2*  INR 1.6*   ABG    Component Value Date/Time   PHART 7.360 04/23/2022 2105   HCO3 19.0 (L) 04/23/2022 2105   TCO2 20 (L) 04/23/2022 2105   ACIDBASEDEF 6.0 (H) 04/23/2022 2105   O2SAT 95 04/23/2022 2105   CBG (last 3)  Recent Labs    04/24/22 1655  04/25/22 2138 04/26/22 0609  GLUCAP 140* 100* 117*    Assessment/Plan: S/P Procedure(s) (LRB): MITRAL VALVE REPAIR (MVR) using 33mSimuform ring (N/A) MAZE (N/A) TRANSESOPHAGEAL ECHOCARDIOGRAM (TEE) (N/A)  CV: AV paced, rate 81. ECG ordered, awaiting results. Hx of CHB, possible PPM. No beta blocker. SBP 118.   Pulm: CXR yesterday showed a small left pleural effusion. Saturating 94% on RA. Walked once yesterday. Continue IS and ambulation.   GI: -BM. Appetite and nausea improving. Passing gas. Good bowel sounds.   Endo: Nondiabetic, will d/c cbgs  Renal: Diuresed well yesterday. K 2.9. +3lbs from preop weight. Will continue diuresis and K supplement. Cr 0.99.  ID: Leukocytosis much improved. 12.4 today  Expected postop ABLA: improved 12.6/36.2  INR: 1.6 today after '5mg'$  coumadin yesterday. Keep under 2.0 for possible PPM. Will continue '5mg'$  of coumadin.   Dispo: Progressing well, will discuss with surgeon possible PPM. Discharge planning next 48 hours depending on need for PPM.    LOS: 3 days    BaMagdalene RiverPA-C 04/26/2022

## 2022-04-26 NOTE — Plan of Care (Signed)
  Problem: Education: Goal: Knowledge of General Education information will improve Description Including pain rating scale, medication(s)/side effects and non-pharmacologic comfort measures Outcome: Progressing   Problem: Health Behavior/Discharge Planning: Goal: Ability to manage health-related needs will improve Outcome: Progressing   
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
## 2022-04-27 ENCOUNTER — Other Ambulatory Visit: Payer: Self-pay | Admitting: Cardiology

## 2022-04-27 DIAGNOSIS — Z9889 Other specified postprocedural states: Secondary | ICD-10-CM

## 2022-04-27 LAB — BASIC METABOLIC PANEL
Anion gap: 14 (ref 5–15)
BUN: 23 mg/dL (ref 8–23)
CO2: 40 mmol/L — ABNORMAL HIGH (ref 22–32)
Calcium: 9.6 mg/dL (ref 8.9–10.3)
Chloride: 83 mmol/L — ABNORMAL LOW (ref 98–111)
Creatinine, Ser: 1.03 mg/dL (ref 0.61–1.24)
GFR, Estimated: >60 mL/min (ref >60)
Glucose, Bld: 110 mg/dL — ABNORMAL HIGH (ref 70–99)
Potassium: 2.4 mmol/L — CL (ref 3.5–5.1)
Sodium: 137 mmol/L (ref 135–145)

## 2022-04-27 LAB — PROTIME-INR
INR: 2.4 — ABNORMAL HIGH (ref 0.8–1.2)
Prothrombin Time: 25.6 seconds — ABNORMAL HIGH (ref 11.4–15.2)

## 2022-04-27 LAB — CBC
HCT: 37.4 % — ABNORMAL LOW (ref 39.0–52.0)
Hemoglobin: 12.4 g/dL — ABNORMAL LOW (ref 13.0–17.0)
MCH: 27.1 pg (ref 26.0–34.0)
MCHC: 33.2 g/dL (ref 30.0–36.0)
MCV: 81.7 fL (ref 80.0–100.0)
Platelets: 141 10*3/uL — ABNORMAL LOW (ref 150–400)
RBC: 4.58 MIL/uL (ref 4.22–5.81)
RDW: 12.9 % (ref 11.5–15.5)
WBC: 11.1 10*3/uL — ABNORMAL HIGH (ref 4.0–10.5)
nRBC: 0 % (ref 0.0–0.2)

## 2022-04-27 LAB — POTASSIUM: Potassium: 3.1 mmol/L — ABNORMAL LOW (ref 3.5–5.1)

## 2022-04-27 LAB — GLUCOSE, CAPILLARY: Glucose-Capillary: 118 mg/dL — ABNORMAL HIGH (ref 70–99)

## 2022-04-27 MED ORDER — POTASSIUM CHLORIDE CRYS ER 20 MEQ PO TBCR
40.0000 meq | EXTENDED_RELEASE_TABLET | Freq: Once | ORAL | Status: AC
  Administered 2022-04-27: 40 meq via ORAL
  Filled 2022-04-27: qty 2

## 2022-04-27 MED ORDER — AMLODIPINE BESYLATE 5 MG PO TABS
5.0000 mg | ORAL_TABLET | Freq: Every day | ORAL | Status: DC
  Administered 2022-04-27 – 2022-04-29 (×3): 5 mg via ORAL
  Filled 2022-04-27 (×3): qty 1

## 2022-04-27 MED ORDER — FUROSEMIDE 40 MG PO TABS
40.0000 mg | ORAL_TABLET | Freq: Every day | ORAL | Status: DC
  Administered 2022-04-27 – 2022-04-29 (×3): 40 mg via ORAL
  Filled 2022-04-27 (×3): qty 1

## 2022-04-27 NOTE — Progress Notes (Signed)
CARDIAC REHAB PHASE I   PRE:  Rate/Rhythm: 62 SR  BP:  Supine:   Sitting: 119/67  Standing:    SaO2: 955 RA  MODE:  Ambulation: 400 ft   POST:  Rate/Rhythm: 67 SR  BP:  Supine:   Sitting: 149/82  Standing:    SaO2: 99% RA  [id number] Patient tolerated ambulation fair with assist x1 and pushing rolling walker. Fast, steady gait, c/o fatigue. Two standing rest breaks taken, leaning on walker with head in hands. Patient states he hasn't rested well, hasn't eaten well since hospitalization, and hasn't had a bowel movement yet, all of which he feels is contributory to his fatigue. Denies pain. To bed after ambulation, external pacer intact, call bell within reach.  Sol Passer, MS, ACSM CEP

## 2022-04-27 NOTE — Progress Notes (Signed)
  Telemetry reviewed.  HRs remains stable 50-60s with no significant bradycardia.   Will plan 14 day Zio at discharge, with close follow up with Dr. Lovena Le to discuss whether or not pacing is indicated based on results.   Please call with any significant or symptomatic bradycardia.    Legrand Como [address]" [place], PA-C  04/27/2022 7:17 AM

## 2022-04-27 NOTE — Progress Notes (Addendum)
[address]       [place] [address]             [phone]      4 Days Post-Op Procedure(s) (LRB): MITRAL VALVE REPAIR (MVR) using 44m Simuform ring (N/A) MAZE (N/A) TRANSESOPHAGEAL ECHOCARDIOGRAM (TEE) (N/A) Subjective: Patient states he feels well today, has some chest soreness. No new complaints.  Objective: Vital signs in last 24 hours: Temp:  [97.7 F (36.5 C)-99.5 F (37.5 C)] 98.6 F (37 C) (11/14 0341) Pulse Rate:  [55-65] 55 (11/14 0341) Cardiac Rhythm: Ventricular paced (11/13 2015) Resp:  [16-22] 20 (11/14 0341) BP: (108-150)/(47-70) 150/68 (11/14 0341) SpO2:  [90 %-94 %] 90 % (11/14 0341) Weight:  [93.2 kg] 93.2 kg (11/14 0341)  Hemodynamic parameters for last 24 hours:    Intake/Output from previous day: 11/13 0701 - 11/14 0700 In: 266.4 [P.O.:240; IV Piggyback:26.4] Out: 600 [Urine:600] Intake/Output this shift: No intake/output data recorded.  General appearance: alert, cooperative, and no distress Neurologic: intact Heart: regular rate and rhythm, S1, S2 normal, no murmur, click, rub or gallop Lungs: Slightly diminished bibasilar Abdomen: soft, non-tender; bowel sounds normal; no masses,  no organomegaly Extremities: extremities normal, atraumatic, no cyanosis or edema Wound: clean, dry, intact  Lab Results: Recent Labs    04/26/22 0109 04/27/22 0105  WBC 12.4* 11.1*  HGB 12.6* 12.4*  HCT 36.2* 37.4*  PLT 102* 141*   BMET:  Recent Labs    04/26/22 0109 04/27/22 0105  NA 135 137  K 2.9* 2.4*  CL 87* 83*  CO2 37* 40*  GLUCOSE 107* 110*  BUN 14 23  CREATININE 0.99 1.03  CALCIUM 9.5 9.6    PT/INR:  Recent Labs    04/27/22 0105  LABPROT 25.6*  INR 2.4*   ABG    Component Value Date/Time   PHART 7.360 04/23/2022 2105   HCO3 19.0 (L) 04/23/2022 2105   TCO2 20 (L) 04/23/2022 2105   ACIDBASEDEF 6.0 (H) 04/23/2022 2105   O2SAT 95 04/23/2022 2105   CBG (last 3)  Recent Labs    04/26/22 1720  04/26/22 2126 04/27/22 0610  GLUCAP 148* 118* 118*    Assessment/Plan: S/P Procedure(s) (LRB): MITRAL VALVE REPAIR (MVR) using 368mSimuform ring (N/A) MAZE (N/A) TRANSESOPHAGEAL ECHOCARDIOGRAM (TEE) (N/A)  CV: Hx of CHB/junctional rhythm. NSR, rate 61 this AM. No longer pacing. Will hold coumadin today and d/c EPW tomorrow. EP consulted and recommended watchful waiting and discharge with 14 day zio monitor. Suggested no AV blocking agents. Appreciate the assistance. SBP 150 this AM. Discussed with surgeon, will restart home norvasc.   Pulm: Small left pleural effusion. Yesterday consulted IR for left thoracentesis, limited ultrasound showed small pocket of fluid. Did not proceed with thoracentesis. Saturating 90% on RA. Ambulated once yesterday. Continue IS and ambulation.    GI: Small BM yesterday. Appetite improving.   Endo: Nondiabetic, CBGs 148/118/118.    Renal: On Bumex '2mg'$  BID. -18lbs today? Unsure if correct. UO 600 last shift. No UO recorded for previous shift. Hypokalemia, K 2.4 today after '80mg'$  K supplement yesterday. Will resend in case of error and continue to supplement. Cr 1.03.    ID: Leukocytosis improving. 11.1 today   Expected postop ABLA: improved 12.4/37.4  Thrombocytopenia: Improved, 141,000 today.    INR: 2.4 today after '5mg'$  coumadin yesterday. Will hold coumadin today to pull EPW tomorrow.    Dispo: D/C EPW tomorrow. Discharge planning next 48 hours. Will discharge with 14 day  zio monitor per EP.    LOS: 4 days    Magdalene River, PA-C 04/27/2022 Agree with above. Will reduce diuretic to lasix once a day Hopefully home tomorrow

## 2022-04-28 LAB — BASIC METABOLIC PANEL
Anion gap: 14 (ref 5–15)
BUN: 24 mg/dL — ABNORMAL HIGH (ref 8–23)
CO2: 41 mmol/L — ABNORMAL HIGH (ref 22–32)
Calcium: 9.6 mg/dL (ref 8.9–10.3)
Chloride: 81 mmol/L — ABNORMAL LOW (ref 98–111)
Creatinine, Ser: 1.05 mg/dL (ref 0.61–1.24)
GFR, Estimated: >60 mL/min (ref >60)
Glucose, Bld: 114 mg/dL — ABNORMAL HIGH (ref 70–99)
Potassium: 2.6 mmol/L — CL (ref 3.5–5.1)
Sodium: 136 mmol/L (ref 135–145)

## 2022-04-28 LAB — POTASSIUM: Potassium: 2.6 mmol/L — CL (ref 3.5–5.1)

## 2022-04-28 LAB — PROTIME-INR
INR: 1.8 — ABNORMAL HIGH (ref 0.8–1.2)
Prothrombin Time: 20.5 seconds — ABNORMAL HIGH (ref 11.4–15.2)

## 2022-04-28 MED ORDER — POTASSIUM CHLORIDE CRYS ER 20 MEQ PO TBCR: 40.0000 meq | EXTENDED_RELEASE_TABLET | Freq: Two times a day (BID) | ORAL | Status: DC

## 2022-04-28 MED ORDER — WARFARIN SODIUM 5 MG PO TABS
5.0000 mg | ORAL_TABLET | Freq: Once | ORAL | Status: AC
  Administered 2022-04-28: 5 mg via ORAL
  Filled 2022-04-28: qty 1

## 2022-04-28 MED ORDER — POTASSIUM CHLORIDE CRYS ER 20 MEQ PO TBCR
40.0000 meq | EXTENDED_RELEASE_TABLET | Freq: Four times a day (QID) | ORAL | Status: AC
  Administered 2022-04-28 (×4): 40 meq via ORAL
  Filled 2022-04-28 (×4): qty 2

## 2022-04-28 MED ORDER — MAGNESIUM SULFATE 2 GM/50ML IV SOLN
2.0000 g | Freq: Once | INTRAVENOUS | Status: AC
  Administered 2022-04-28: 2 g via INTRAVENOUS
  Filled 2022-04-28: qty 50

## 2022-04-28 NOTE — Progress Notes (Addendum)
[address]       [place] [address]             [phone]      5 Days Post-Op Procedure(s) (LRB): MITRAL VALVE REPAIR (MVR) using 64m Simuform ring (N/A) MAZE (N/A) TRANSESOPHAGEAL ECHOCARDIOGRAM (TEE) (N/A) Subjective: Patient states he is irritable because he is uncomfortable and not sleeping well. States he is feeling better since he has received pain medication.   Objective: Vital signs in last 24 hours: Temp:  [97.6 F (36.4 C)-99.7 F (37.6 C)] 98.5 F (36.9 C) (11/14 2338) Pulse Rate:  [61-64] 61 (11/15 0441) Cardiac Rhythm: Heart block (11/14 1900) Resp:  [14-20] 20 (11/15 0441) BP: (96-149)/(48-85) 142/67 (11/15 0441) SpO2:  [91 %-100 %] 92 % (11/15 0441) Weight:  [95.5 kg] 95.5 kg (11/15 0441)  Hemodynamic parameters for last 24 hours:    Intake/Output from previous day: 11/14 0701 - 11/15 0700 In: 480 [P.O.:480] Out: 510 [Urine:510] Intake/Output this shift: No intake/output data recorded.  General appearance: alert, cooperative, and no distress Neurologic: intact Heart: CHB, no murmur, rub, gallop Lungs: clear to auscultation bilaterally Abdomen: soft, non-tender; bowel sounds normal; no masses,  no organomegaly Extremities: extremities normal, atraumatic, no cyanosis or edema Wound: Inflammation at superior portion of sternal incision, otherwise clean, dry and intact.  Lab Results: Recent Labs    04/26/22 0109 04/27/22 0105  WBC 12.4* 11.1*  HGB 12.6* 12.4*  HCT 36.2* 37.4*  PLT 102* 141*   BMET:  Recent Labs    04/27/22 0105 04/27/22 0809 04/28/22 0124  NA 137  --  136  K 2.4* 3.1* 2.6*  CL 83*  --  81*  CO2 40*  --  41*  GLUCOSE 110*  --  114*  BUN 23  --  24*  CREATININE 1.03  --  1.05  CALCIUM 9.6  --  9.6    PT/INR:  Recent Labs    04/28/22 0124  LABPROT 20.5*  INR 1.8*   ABG    Component Value Date/Time   PHART 7.360 04/23/2022 2105   HCO3 19.0 (L) 04/23/2022 2105   TCO2 20 (L) 04/23/2022  2105   ACIDBASEDEF 6.0 (H) 04/23/2022 2105   O2SAT 95 04/23/2022 2105   CBG (last 3)  Recent Labs    04/26/22 1720 04/26/22 2126 04/27/22 0610  GLUCAP 148* 118* 118*    Assessment/Plan: S/P Procedure(s) (LRB): MITRAL VALVE REPAIR (MVR) using 373mSimuform ring (N/A) MAZE (N/A) TRANSESOPHAGEAL ECHOCARDIOGRAM (TEE) (N/A)  CV: Hx of CHB/junctional rhythm. In CHB this AM, rate 61. No longer pacing. EP consulted and recommended watchful waiting and discharge with 14 day zio monitor. Will reach back out to EP now that back in CHB. Possibly d/c EPW later today.    HTN: Labile BP. SBP 96-142 this AM. On norvasc '5mg'$ , will monitor   Pulm: Saturating 92% on RA. Ambulated a few times yesterday. Continue IS and ambulation.    GI: +BM this AM. Appetite improving.   Endo: Nondiabetic, CBGs 148/118/118.    Renal: On lasix '40mg'$  QD. Hypokalemia, K 2.6 today after '80mg'$  K supplement yesterday. Cr 1.05. As discussed with surgeon will continue diuresis and supplement potassium and magnesium today.   INR: 1.8 today after holding coumadin yesterday to remove EPW today. Will consult with EP before d/c EPW and then will restart coumadin.    Dispo: Possibly d/c EPW today. Discharge planning.    LOS: 5 days    BaPricilla Larsson  Stehler, PA-C 04/28/2022  Agree with above Will DC pw today Appreciate EP following along Will make arrangements for monitor to be able to be discharged tomorrow

## 2022-04-28 NOTE — Progress Notes (Signed)
CARDIAC REHAB PHASE I   PRE:  Rate/Rhythm: 68 CHB   BP:  Sitting: 112/53      SaO2: 98 RA   MODE:  Ambulation: 470 ft   POST:  Rate/Rhythm: 64 CHB   BP:  Sitting: 141/81      SaO2: 96 RA   Pt ambulated in hall using front wheel walker standby assist only. Tolerated well with no pain or sob. Returned to room to bed with call bell and bedside table in reach. Post OHS education including site care, restrictions, heart healthy diet, risk factors, sternal precautions, move in the tub, home needs at discharge, IS use at home, exercise guidelines and CRP2 reviewed with pt and wife. All questions and concerns addressed. Will refer to AP for CRP2. Will continue to follow.   [phone]  Vanessa Barbara, RN BSN 04/28/2022 10:49 AM
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
## 2022-04-28 NOTE — Progress Notes (Signed)
Pacing wires removed per order 

## 2022-04-28 NOTE — Progress Notes (Signed)
  Reviewed telemetry.   Pt remains in CHB but with narrow, stable escape. Mostly 60s but occasionally up into 70s. His temp PM is set at a lower rate and he is not pacing.   Continue watchful waiting. We recommend that the patient wear a 14 day zio monitor at discharge. Avoid AV nodal blocking drugs.   Close follow up has been arranged  Legrand Como "Oda Kilts, PA-C  04/28/2022 8:00 AM

## 2022-04-29 ENCOUNTER — Inpatient Hospital Stay (HOSPITAL_BASED_OUTPATIENT_CLINIC_OR_DEPARTMENT_OTHER)
Admit: 2022-04-29 | Discharge: 2022-04-29 | Disposition: A | Discharge status: Home / Self Care | Payer: Medicare Other | Attending: Student | Admitting: Student

## 2022-04-29 ENCOUNTER — Telehealth: Payer: Self-pay | Admitting: Internal Medicine

## 2022-04-29 DIAGNOSIS — R001 Bradycardia, unspecified: Secondary | ICD-10-CM

## 2022-04-29 LAB — BASIC METABOLIC PANEL
Anion gap: 13 (ref 5–15)
BUN: 21 mg/dL (ref 8–23)
CO2: 37 mmol/L — ABNORMAL HIGH (ref 22–32)
Calcium: 9.2 mg/dL (ref 8.9–10.3)
Chloride: 85 mmol/L — ABNORMAL LOW (ref 98–111)
Creatinine, Ser: 0.98 mg/dL (ref 0.61–1.24)
GFR, Estimated: >60 mL/min (ref >60)
Glucose, Bld: 113 mg/dL — ABNORMAL HIGH (ref 70–99)
Potassium: 3.1 mmol/L — ABNORMAL LOW (ref 3.5–5.1)
Sodium: 135 mmol/L (ref 135–145)

## 2022-04-29 LAB — PROTIME-INR
INR: 1.7 — ABNORMAL HIGH (ref 0.8–1.2)
Prothrombin Time: 19.4 seconds — ABNORMAL HIGH (ref 11.4–15.2)

## 2022-04-29 LAB — MAGNESIUM: Magnesium: 2 mg/dL (ref 1.7–2.4)

## 2022-04-29 MED ORDER — FUROSEMIDE 40 MG PO TABS: 40.0000 mg | ORAL_TABLET | Freq: Every day | ORAL | 0 refills | Status: DC

## 2022-04-29 MED ORDER — POTASSIUM CHLORIDE CRYS ER 20 MEQ PO TBCR: 20.0000 meq | EXTENDED_RELEASE_TABLET | Freq: Two times a day (BID) | ORAL | 0 refills | Status: DC

## 2022-04-29 MED ORDER — WARFARIN SODIUM 5 MG PO TABS: 5.0000 mg | ORAL_TABLET | Freq: Every day | ORAL | 1 refills | Status: DC

## 2022-04-29 MED ORDER — ASPIRIN 81 MG PO TBEC: 81.0000 mg | DELAYED_RELEASE_TABLET | Freq: Every day | ORAL | 12 refills | Status: DC

## 2022-04-29 NOTE — Telephone Encounter (Signed)
Wife stated the patient is not feeling up to attending his Coumadin appointment tomorrow and wants to know if she could attend the appointment and get the information on his behalf.

## 2022-04-29 NOTE — Plan of Care (Signed)
  Problem: Education: Goal: Knowledge of General Education information will improve Description: Including pain rating scale, medication(s)/side effects and non-pharmacologic comfort measures Outcome: Adequate for Discharge   Problem: Health Behavior/Discharge Planning: Goal: Ability to manage health-related needs will improve Outcome: Adequate for Discharge   Problem: Clinical Measurements: Goal: Ability to maintain clinical measurements within normal limits will improve Outcome: Adequate for Discharge Goal: Will remain free from infection Outcome: Adequate for Discharge Goal: Diagnostic test results will improve Outcome: Adequate for Discharge Goal: Respiratory complications will improve Outcome: Adequate for Discharge Goal: Cardiovascular complication will be avoided Outcome: Adequate for Discharge   Problem: Cardiac: Goal: Will achieve and/or maintain hemodynamic stability Outcome: Adequate for Discharge   Problem: Clinical Measurements: Goal: Postoperative complications will be avoided or minimized Outcome: Adequate for Discharge   Problem: Respiratory: Goal: Respiratory status will improve Outcome: Adequate for Discharge   Problem: Skin Integrity: Goal: Wound healing without signs and symptoms of infection Outcome: Adequate for Discharge Goal: Risk for impaired skin integrity will decrease Outcome: Adequate for Discharge

## 2022-04-29 NOTE — Care Management Important Message (Signed)
Important Message  Patient Details  Name: Douglas Waters MRN: [id number] Date of Birth: [birth year]   Medicare Important Message Given:  Yes     Shelda Altes 04/29/2022, 9:02 AM

## 2022-04-29 NOTE — Progress Notes (Signed)
CARDIAC REHAB PHASE I   Pt feeling well this morning and ready to get home. Getting dressed and packed up. Reviewed education provided yesterday. All concerns and questions addressed.   [phone]  Vanessa Barbara, RN BSN 04/29/2022 8:27 AM

## 2022-04-29 NOTE — Progress Notes (Signed)
      [address]       [place] [address]             [phone]      6 Days Post-Op Procedure(s) (LRB): MITRAL VALVE REPAIR (MVR) using 48m Simuform ring (N/A) MAZE (N/A) TRANSESOPHAGEAL ECHOCARDIOGRAM (TEE) (N/A) Subjective: Patient feels well today, has chest soreness with certain movements. No new complaints.  Objective: Vital signs in last 24 hours: Temp:  [98.2 F (36.8 C)-99.3 F (37.4 C)] 98.9 F (37.2 C) (11/16 0413) Pulse Rate:  [56-74] 74 (11/16 0413) Cardiac Rhythm: Normal sinus rhythm (11/16 0350) Resp:  [15-23] 20 (11/16 0413) BP: (118-135)/(45-73) 121/69 (11/16 0413) SpO2:  [91 %-99 %] 98 % (11/16 0413) Weight:  [95.5 kg] 95.5 kg (11/16 0607)  Hemodynamic parameters for last 24 hours:    Intake/Output from previous day: 11/15 0701 - 11/16 0700 In: -  Out: 300 [Urine:300] Intake/Output this shift: No intake/output data recorded.  General appearance: alert, cooperative, and no distress Neurologic: intact Heart: CHB, no murmur, rub or gallop Lungs: clear to auscultation bilaterally Abdomen: soft, non-tender; bowel sounds normal; no masses,  no organomegaly Extremities: extremities normal, atraumatic, no cyanosis or edema Wound: Clean, dry, intact  Lab Results: Recent Labs    04/27/22 0105  WBC 11.1*  HGB 12.4*  HCT 37.4*  PLT 141*   BMET:  Recent Labs    04/28/22 0124 04/28/22 1448 04/29/22 0105  NA 136  --  135  K 2.6* 2.6* 3.1*  CL 81*  --  85*  CO2 41*  --  37*  GLUCOSE 114*  --  113*  BUN 24*  --  21  CREATININE 1.05  --  0.98  CALCIUM 9.6  --  9.2    PT/INR:  Recent Labs    04/29/22 0105  LABPROT 19.4*  INR 1.7*   ABG    Component Value Date/Time   PHART 7.360 04/23/2022 2105   HCO3 19.0 (L) 04/23/2022 2105   TCO2 20 (L) 04/23/2022 2105   ACIDBASEDEF 6.0 (H) 04/23/2022 2105   O2SAT 95 04/23/2022 2105   CBG (last 3)  Recent Labs    04/26/22 1720 04/26/22 2126 04/27/22 0610  GLUCAP 148*  118* 118*    Assessment/Plan: S/P Procedure(s) (LRB): MITRAL VALVE REPAIR (MVR) using 344mSimuform ring (N/A) MAZE (N/A) TRANSESOPHAGEAL ECHOCARDIOGRAM (TEE) (N/A)  CV: Hx of CHB/junctional rhythm. CHB and atrial fibrillation this AM, rate in the 70's. EP following, recommended discharge with 14 day zio monitor and close follow up. Appreciate their assistance. SBP 121, on Norvasc '5mg'$ .    Pulm: Saturating 98% on RA. Continue IS and ambulation.    GI: +BM, Good appetite   Renal: Volume overload, on lasix '40mg'$  QD. Hypokalemia improved, K 3.1 today. Stable Cr 0.98. As discussed with surgeon will discharge on Lasix and Potassium.   INR: 1.7 today after '5mg'$  of coumadin yesterday. Will discharge on '5mg'$  coumadin daily.    Dispo: Discharge to home today, rolling walker ordered.        LOS: 6 days    BaMagdalene RiverPA-C 04/29/2022

## 2022-04-29 NOTE — Telephone Encounter (Signed)
Called pt's wife and rescheduled coumadin clinic appt for 05/04/22 in [place]. Confirmed with Edrick Oh, RN. Pt's wife verbalized understanding.

## 2022-04-29 NOTE — TOC Transition Note (Signed)
Transition of Care (TOC) - CM/SW Discharge Note Marvetta Gibbons RN, BSN Transitions of Care Unit 4E- RN Case Manager See Treatment Team for direct phone #   Patient Details  Name: Douglas Waters MRN: [id number] Date of Birth: 1951-01-11  Transition of Care [facility]) CM/SW Contact:  Dawayne Patricia, RN Phone Number: 04/29/2022, 12:27 PM   Clinical Narrative:    Pt stable for transition home, DME- RW has been arranged- No further TOC needs noted.  Wife to transport home.    Final next level of care: Home/Self Care Barriers to Discharge: Barriers Resolved   Patient Goals and CMS Choice Patient states their goals for this hospitalization and ongoing recovery are:: return home CMS Medicare.gov Compare Post Acute Care list provided to:: Patient Represenative (must comment) Choice offered to / list presented to : Spouse  Discharge Placement                 Home      Discharge Plan and Services   Discharge Planning Services: CM Consult Post Acute Care Choice: Durable Medical Equipment          DME Arranged: Walker rolling DME Agency: [employer] Date DME Agency Contacted: 04/28/22 Time DME Agency Contacted: [phone] Representative spoke with at DME Agency: [employer] [facility] HH Arranged: NA [place] Agency: NA        Social Determinants of Health (SDOH) Interventions     Readmission Risk Interventions     No data to display

## 2022-04-30 ENCOUNTER — Observation Stay: Payer: Medicare Other

## 2022-04-30 ENCOUNTER — Other Ambulatory Visit: Payer: Self-pay | Admitting: Thoracic Surgery (Cardiothoracic Vascular Surgery)

## 2022-05-03 ENCOUNTER — Ambulatory Visit (INDEPENDENT_AMBULATORY_CARE_PROVIDER_SITE_OTHER): Payer: Self-pay | Admitting: Physician Assistant

## 2022-05-03 ENCOUNTER — Ambulatory Visit
Admission: RE | Admit: 2022-05-03 | Discharge: 2022-05-03 | Disposition: A | Discharge status: Home / Self Care | Payer: Medicare Other | Source: Ambulatory Visit | Attending: Thoracic Surgery (Cardiothoracic Vascular Surgery) | Admitting: Thoracic Surgery (Cardiothoracic Vascular Surgery)

## 2022-05-03 ENCOUNTER — Telehealth: Payer: Self-pay | Admitting: *Deleted

## 2022-05-03 ENCOUNTER — Other Ambulatory Visit: Payer: Self-pay | Admitting: Thoracic Surgery (Cardiothoracic Vascular Surgery)

## 2022-05-03 VITALS — BP 133/75 | HR 84 | Resp 20 | Ht 70.0 in | Wt 219.0 lb

## 2022-05-03 DIAGNOSIS — Z9889 Other specified postprocedural states: Secondary | ICD-10-CM

## 2022-05-03 NOTE — Patient Instructions (Signed)
You may stop the Lasix and potassium after today's dosages.  Continue to observe sternal precautions with no lifting, pushing, or pulling more than 10 pounds  Follow-up with Dr. Tommi Rumps in 3 weeks and as needed

## 2022-05-03 NOTE — Telephone Encounter (Signed)
Call was placed to answering service over the weekend regarding drainage from surgical incision. Per wife, Dr. Kipp Brood returned patient's call. Patient has appt scheduled for today to see a PA. Advised we will take a look at the incision during this appt.

## 2022-05-03 NOTE — Progress Notes (Signed)
HPI: [name] is a 71 year old male with a past history of coronary artery disease, persistent atrial fibrillation, depression and anxiety, hypertension, and stage III tonsillar cancer diagnosed in 2015.  He was recently found to have severe symptomatic mitral insufficiency.  On Dr. Nicholos Johns recommendation, he was admitted to the hospital for elective surgery on 04/24/2022 where the mitral valve was repaired and a Maze procedure was performed.  Postoperatively he made a progressive and satisfactory recovery.  He was noted to have complete heart block after surgery and was followed by the EP service.  By the time of discharge, he remained in complete heart block with a consistent narrow complex junctional escape rhythm mostly in the 60 to 70 bpm range.  The EP service felt it was safe for Douglas Waters to discharge to home with a patch monitor in place.  All antiarrhythmic and beta blocking agents were held after he was discovered to have complete heart block. Douglas Waters was also noted to have moderate left pleural effusion after surgery.  This was evaluated by the interventional radiology team and was was not felt to represent sufficient amount of fluid to warrant thoracentesis.  Douglas Waters was discharged home in stable condition on postop day 6.  He returns today for scheduled follow-up.  Since hospital discharge the patient reports continued gradual improvement.  He denies having any chest pain or palpitations.  He is walking with the assistance of a cane.  Douglas Waters has not yet had his PT/INR tested since his discharge on 11/ 16.  He is scheduled to be seen in the [facility] Coumadin clinic tomorrow.  Current Outpatient Medications  Medication Sig Dispense Refill   amLODipine (NORVASC) 5 MG tablet TAKE 1 TABLET ONCE DAILY. 90 tablet 3   aspirin EC 81 MG tablet Take 1 tablet (81 mg total) by mouth daily. Swallow whole. 30 tablet 12   clonazePAM (KLONOPIN) 0.5 MG tablet Take 0.5 mg by mouth 2 (two)  times daily as needed for anxiety.     fentaNYL (DURAGESIC - DOSED MCG/HR) 25 MCG/HR Place 1 patch onto the skin every 3 (three) days.       furosemide (LASIX) 40 MG tablet Take 1 tablet (40 mg total) by mouth daily. 7 tablet 0   HYDROcodone-acetaminophen (NORCO) 10-325 MG tablet Take 1 tablet by mouth every 6 (six) hours as needed for moderate pain.     LORazepam (ATIVAN) 0.5 MG tablet Take 1 tablet (0.5 mg total) by mouth 2 (two) times daily as needed for anxiety. (Patient taking differently: Take 0.5 mg by mouth 2 (two) times daily as needed (nausea).) 60 tablet 1   OVER THE COUNTER MEDICATION Take 1 capsule by mouth in the morning and at bedtime. Prostagenix supplement     polyethylene glycol (MIRALAX / GLYCOLAX) packet Take 17 g by mouth every other day.     potassium chloride SA (KLOR-CON M) 20 MEQ tablet Take 1 tablet (20 mEq total) by mouth 2 (two) times daily. 14 tablet 0   warfarin (COUMADIN) 5 MG tablet Take 1 tablet (5 mg total) by mouth daily. 30 tablet 1   No current facility-administered medications for this visit.    Physical Exam:  Vital signs BP 133/75 Pulse 84 Respirations 20 SPO2 96% on room air  General: Douglas Waters is awake and alert and in good spirits.  Has no plaints or concerns except some shortness of breath that he believes is gradually improving. Heart: Irregular rhythm.  Heart rate is improved  to the mid 80s.  He has a Zio patch monitor in place on the left side of his chest.  There is no murmur Chest: Breath sounds are full, equal, and clear to auscultation.  The chest x-ray shows resolution of the left basilar volume loss currently due to atelectasis. Extremities: There is no peripheral edema.  Diagnostic Tests: Narrative & Impression  CLINICAL DATA:  Status post MVR shortness of breath   EXAM: CHEST - 2 VIEW   COMPARISON:  04/25/2022   FINDINGS: Status post median sternotomy with mitral valve prosthesis. Interval resolution of previously seen left  pleural effusion and diffuse interstitial opacity. Bandlike scarring or atelectasis persisting at the left lung base. Disc degenerative disease of the thoracic spine.   IMPRESSION: 1. Interval resolution of previously seen left pleural effusion and diffuse interstitial opacity. 2. Bandlike scarring or atelectasis persisting at the left lung base.     Electronically Signed   By: Delanna Ahmadi M.D.   On: 05/03/2022 12:30    Impression / Plan: Douglas Waters appears to be making progressive recovery following mitral valve repair and maze procedure for persistent atrial fibrillation.  Postoperatively, he had complete heart block with junctional escape rhythm in the mid 60s.  His heart rate has improved to the mid 80s.  He has a Pharmacist, community and is being followed by the electrophysiology team.  He is on Coumadin for the chronic atrial fibrillation and would like to transition back to Xarelto whenever feasible.  His first INR is scheduled for tomorrow. The left basilar atelectasis and pleural effusion have resolved.  He has no peripheral edema.  Plan to stop the Lasix and potassium after today's dose.  Follow-up with Dr. Lavonna Monarch in 3 weeks and as needed.   Antony Odea, PA-C [facility] [phone]

## 2022-05-04 ENCOUNTER — Ambulatory Visit: Payer: Medicare Other | Attending: Cardiology | Admitting: *Deleted

## 2022-05-04 DIAGNOSIS — I341 Nonrheumatic mitral (valve) prolapse: Secondary | ICD-10-CM

## 2022-05-04 DIAGNOSIS — Z5181 Encounter for therapeutic drug level monitoring: Secondary | ICD-10-CM | POA: Diagnosis not present

## 2022-05-04 DIAGNOSIS — Z9889 Other specified postprocedural states: Secondary | ICD-10-CM

## 2022-05-04 LAB — POCT INR: INR: 2.9 (ref 2.0–3.0)

## 2022-05-04 MED ORDER — FUROSEMIDE 40 MG PO TABS: 40.0000 mg | ORAL_TABLET | ORAL | 0 refills | Status: DC | PRN

## 2022-05-04 MED ORDER — POTASSIUM CHLORIDE CRYS ER 20 MEQ PO TBCR: 20.0000 meq | EXTENDED_RELEASE_TABLET | ORAL | 0 refills | Status: DC | PRN

## 2022-05-04 MED FILL — Heparin Sodium (Porcine) Inj 1000 Unit/ML: INTRAMUSCULAR | Qty: 20 | Status: AC

## 2022-05-04 MED FILL — Sodium Bicarbonate IV Soln 8.4%: INTRAVENOUS | Qty: 50 | Status: AC

## 2022-05-04 MED FILL — Potassium Chloride Inj 2 mEq/ML: INTRAVENOUS | Qty: 40 | Status: AC

## 2022-05-04 MED FILL — Sodium Chloride IV Soln 0.9%: INTRAVENOUS | Qty: 3000 | Status: AC

## 2022-05-04 MED FILL — Electrolyte-R (PH 7.4) Solution: INTRAVENOUS | Qty: 4000 | Status: AC

## 2022-05-04 MED FILL — Heparin Sodium (Porcine) Inj 1000 Unit/ML: Qty: 1000 | Status: AC

## 2022-05-04 MED FILL — Lidocaine HCl Local Preservative Free (PF) Inj 2%: INTRAMUSCULAR | Qty: 14 | Status: AC

## 2022-05-04 NOTE — Patient Instructions (Signed)
D/C INR 1.7   D/C on warfarin '5mg'$  daily Continue warfarin 1 tablet ('5mg'$ ) daily Recheck in 1 week

## 2022-05-11 ENCOUNTER — Ambulatory Visit: Payer: Medicare Other | Attending: Internal Medicine | Admitting: *Deleted

## 2022-05-11 DIAGNOSIS — I341 Nonrheumatic mitral (valve) prolapse: Secondary | ICD-10-CM

## 2022-05-11 DIAGNOSIS — Z5181 Encounter for therapeutic drug level monitoring: Secondary | ICD-10-CM | POA: Diagnosis not present

## 2022-05-11 DIAGNOSIS — Z9889 Other specified postprocedural states: Secondary | ICD-10-CM

## 2022-05-11 LAB — POCT INR: INR: 2.5 (ref 2.0–3.0)

## 2022-05-11 NOTE — Patient Instructions (Signed)
Continue warfarin 1 tablet ('5mg'$ ) daily Recheck in 1 week

## 2022-05-16 ENCOUNTER — Other Ambulatory Visit: Payer: Self-pay | Admitting: Internal Medicine

## 2022-05-20 ENCOUNTER — Ambulatory Visit: Payer: Medicare Other | Attending: Internal Medicine | Admitting: *Deleted

## 2022-05-20 DIAGNOSIS — I341 Nonrheumatic mitral (valve) prolapse: Secondary | ICD-10-CM

## 2022-05-20 DIAGNOSIS — Z5181 Encounter for therapeutic drug level monitoring: Secondary | ICD-10-CM

## 2022-05-20 DIAGNOSIS — Z9889 Other specified postprocedural states: Secondary | ICD-10-CM | POA: Diagnosis not present

## 2022-05-20 LAB — POCT INR: INR: 2.5 (ref 2.0–3.0)

## 2022-05-20 NOTE — Patient Instructions (Signed)
Continue warfarin 1 tablet ('5mg'$ ) daily Recheck in 1 week

## 2022-05-24 ENCOUNTER — Encounter: Payer: Self-pay | Admitting: Thoracic Surgery (Cardiothoracic Vascular Surgery)

## 2022-05-24 ENCOUNTER — Ambulatory Visit (INDEPENDENT_AMBULATORY_CARE_PROVIDER_SITE_OTHER): Payer: Self-pay | Admitting: Thoracic Surgery (Cardiothoracic Vascular Surgery)

## 2022-05-24 ENCOUNTER — Other Ambulatory Visit: Payer: Self-pay | Admitting: *Deleted

## 2022-05-24 VITALS — BP 151/75 | HR 89 | Resp 20 | Ht 70.0 in | Wt 222.0 lb

## 2022-05-24 DIAGNOSIS — Z9889 Other specified postprocedural states: Secondary | ICD-10-CM

## 2022-05-24 NOTE — Progress Notes (Signed)
Amb referral placed for Cardiac Rehab at [facility] per Dr. Lavonna Monarch.
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
## 2022-05-25 ENCOUNTER — Telehealth: Payer: Self-pay | Admitting: Internal Medicine

## 2022-05-25 MED ORDER — RIVAROXABAN 20 MG PO TABS: 20.0000 mg | ORAL_TABLET | Freq: Every day | ORAL | 5 refills | Status: DC

## 2022-05-25 NOTE — Telephone Encounter (Signed)
Pt saw Dr Lavonna Monarch yesterday.  Was told to stop warfarin and restart Xarelto '20mg'$  daily.  Rx sent to pharmacy.

## 2022-05-25 NOTE — Addendum Note (Signed)
Encounter addended by: Gerarda Gunther on: 05/25/2022 4:38 PM  Actions taken: Imaging Exam ended

## 2022-05-25 NOTE — Addendum Note (Signed)
Encounter addended by: Gerarda Gunther on: 05/25/2022 4:40 PM  Actions taken: Imaging Exam ended

## 2022-05-25 NOTE — Telephone Encounter (Signed)
*  STAT* If patient is at the pharmacy, call can be transferred to refill team.   1. Which medications need to be refilled? (please list name of each medication and dose if known)   rivaroxaban (XARELTO) 10 MG TABS tablet   2. Which pharmacy/location (including street and city if local pharmacy) is medication to be sent to?  [place], [place]    3. Do they need a 30 day or 90 day supply?   90 day  Wife stated the patient is completely out of this medication.

## 2022-05-28 ENCOUNTER — Ambulatory Visit: Payer: Medicare Other | Attending: Internal Medicine | Admitting: Internal Medicine

## 2022-05-28 ENCOUNTER — Encounter: Payer: Self-pay | Admitting: Internal Medicine

## 2022-05-28 VITALS — BP 118/58 | HR 85 | Ht 72.0 in | Wt 226.0 lb

## 2022-05-28 DIAGNOSIS — I4891 Unspecified atrial fibrillation: Secondary | ICD-10-CM | POA: Diagnosis not present

## 2022-05-28 DIAGNOSIS — R0602 Shortness of breath: Secondary | ICD-10-CM

## 2022-05-28 DIAGNOSIS — I10 Essential (primary) hypertension: Secondary | ICD-10-CM | POA: Diagnosis not present
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
## 2022-05-28 NOTE — Patient Instructions (Signed)

## 2022-06-02 NOTE — Progress Notes (Signed)
No, you're right! 1 year is fine.

## 2022-06-04 ENCOUNTER — Encounter (HOSPITAL_COMMUNITY): Admission: RE | Admit: 2022-06-04 | Payer: Medicare Other | Source: Ambulatory Visit

## 2022-06-10 ENCOUNTER — Ambulatory Visit (HOSPITAL_COMMUNITY): Payer: Medicare Other | Attending: Cardiology

## 2022-06-10 DIAGNOSIS — I4891 Unspecified atrial fibrillation: Secondary | ICD-10-CM | POA: Diagnosis not present

## 2022-06-10 DIAGNOSIS — Z9889 Other specified postprocedural states: Secondary | ICD-10-CM

## 2022-06-10 DIAGNOSIS — R0602 Shortness of breath: Secondary | ICD-10-CM | POA: Diagnosis not present

## 2022-06-10 DIAGNOSIS — I361 Nonrheumatic tricuspid (valve) insufficiency: Secondary | ICD-10-CM | POA: Diagnosis not present

## 2022-06-10 DIAGNOSIS — I10 Essential (primary) hypertension: Secondary | ICD-10-CM | POA: Diagnosis not present

## 2022-06-10 LAB — ECHOCARDIOGRAM COMPLETE
Area-P 1/2: 3.34 cm2
MV VTI: 1.65 cm2
S' Lateral: 3.4 cm

## 2022-07-20 ENCOUNTER — Encounter: Payer: Self-pay | Admitting: Vascular Surgery

## 2022-07-20 ENCOUNTER — Ambulatory Visit (INDEPENDENT_AMBULATORY_CARE_PROVIDER_SITE_OTHER): Payer: Medicare Other | Admitting: Vascular Surgery

## 2022-07-20 VITALS — BP 125/75 | HR 79 | Temp 98.0°F | Resp 18 | Ht 72.0 in | Wt 227.0 lb

## 2022-07-20 DIAGNOSIS — I6521 Occlusion and stenosis of right carotid artery: Secondary | ICD-10-CM
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
## 2022-07-21 ENCOUNTER — Other Ambulatory Visit: Payer: Self-pay

## 2022-07-21 DIAGNOSIS — I6521 Occlusion and stenosis of right carotid artery: Secondary | ICD-10-CM

## 2022-08-12 ENCOUNTER — Ambulatory Visit (HOSPITAL_COMMUNITY)
Admission: RE | Admit: 2022-08-12 | Discharge: 2022-08-12 | Disposition: A | Discharge status: Home / Self Care | Payer: Medicare Other | Source: Ambulatory Visit | Attending: Vascular Surgery | Admitting: Vascular Surgery

## 2022-08-12 DIAGNOSIS — I6521 Occlusion and stenosis of right carotid artery: Secondary | ICD-10-CM | POA: Insufficient documentation

## 2022-08-12 MED ORDER — IOHEXOL 350 MG/ML SOLN
75.0000 mL | Freq: Once | INTRAVENOUS | Status: AC | PRN
  Administered 2022-08-12: 75 mL via INTRAVENOUS

## 2022-08-17 ENCOUNTER — Telehealth: Payer: Self-pay

## 2022-08-17 ENCOUNTER — Ambulatory Visit (INDEPENDENT_AMBULATORY_CARE_PROVIDER_SITE_OTHER): Payer: Medicare Other | Admitting: Vascular Surgery

## 2022-08-17 ENCOUNTER — Encounter: Payer: Self-pay | Admitting: Vascular Surgery

## 2022-08-17 ENCOUNTER — Other Ambulatory Visit: Payer: Self-pay

## 2022-08-17 VITALS — BP 119/78 | HR 73 | Temp 97.9°F | Resp 16 | Ht 72.0 in | Wt 226.0 lb

## 2022-08-17 DIAGNOSIS — I6521 Occlusion and stenosis of right carotid artery: Secondary | ICD-10-CM

## 2022-08-17 MED ORDER — ATORVASTATIN CALCIUM 40 MG PO TABS: 40.0000 mg | ORAL_TABLET | Freq: Every day | ORAL | 11 refills | Status: DC

## 2022-08-17 MED ORDER — CLOPIDOGREL BISULFATE 75 MG PO TABS: 75.0000 mg | ORAL_TABLET | Freq: Every day | ORAL | 6 refills | Status: DC

## 2022-08-17 NOTE — Telephone Encounter (Signed)
Called pt to schedule his surgery, LVM for him to return our call.
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
## 2022-08-19 NOTE — Pre-Procedure Instructions (Signed)
Surgical Instructions    Your procedure is scheduled on Monday, March 11.  Report to [facility] Main Entrance "A" at 7:40 A.M., then check in with the Admitting office.  Call this number if you have problems the morning of surgery:  [phone]   If you have any questions prior to your surgery date call [phone]: Open Monday-Friday 8am-4pm If you experience any cold or flu symptoms such as cough, fever, chills, shortness of breath, etc. between now and your scheduled surgery, please notify Douglas Waters at the above number     Remember:  Do not eat or drink after midnight the night before your surgery    Take these medicines the morning of surgery with A SIP OF WATER:  amLODipine (NORVASC)  atorvastatin (LIPITOR)   IF Needed: clonazePAM (KLONOPIN)  OR LORazepam (ATIVAN)  HYDROcodone-acetaminophen ([employer])   Follow your surgeon's instructions on when to stop Clopidogrel (Plavix) and Rivaroxaban (Xarelto).  If no instructions were given by your surgeon then you will need to call the office to get those instructions.    As of today, STOP taking any Aspirin (unless otherwise instructed by your surgeon) Aleve, Naproxen, Ibuprofen, Motrin, Advil, Goody's, BC's, all herbal medications, fish oil, and all vitamins.            [facility] is not responsible for any belongings or valuables.    Do NOT Smoke (Tobacco/Vaping)  24 hours prior to your procedure  If you use a CPAP at night, you may bring your mask for your overnight stay.   Contacts, glasses, hearing aids, dentures or partials may not be worn into surgery, please bring cases for these belongings   For patients admitted to the hospital, discharge time will be determined by your treatment team.   Patients discharged the day of surgery will not be allowed to drive home, and someone needs to stay with them for 24 hours.   SURGICAL WAITING ROOM VISITATION Patients having surgery or a procedure may have no more than 2 support people  in the waiting area - these visitors may rotate.   Children under the age of 31 must have an adult with them who is not the patient. If the patient needs to stay at the hospital during part of their recovery, the visitor guidelines for inpatient rooms apply. Pre-op nurse will coordinate an appropriate time for 1 support person to accompany patient in pre-op.  This support person may not rotate.   Please refer to [email or web address] for the visitor guidelines for Inpatients (after your surgery is over and you are in a regular room).    Special instructions:    Oral Hygiene is also important to reduce your risk of infection.  Remember - BRUSH YOUR TEETH THE MORNING OF SURGERY WITH YOUR REGULAR TOOTHPASTE   [facility] [place]- Preparing For Surgery  Before surgery, you can play an important role. Because skin is not sterile, your skin needs to be as free of germs as possible. You can reduce the number of germs on your skin by washing with CHG (chlorahexidine gluconate) Soap before surgery.  CHG is an antiseptic cleaner which kills germs and bonds with the skin to continue killing germs even after washing.     Please do not use if you have an allergy to CHG or antibacterial soaps. If your skin becomes reddened/irritated stop using the CHG.  Do not shave (including legs and underarms) for at least 48 hours prior to first CHG shower. It is OK to shave your face.  Please follow these instructions carefully.     Shower the NIGHT BEFORE SURGERY and the MORNING OF SURGERY with CHG Soap.   If you chose to wash your hair, wash your hair first as usual with your normal shampoo. After you shampoo, rinse your hair and body thoroughly to remove the shampoo.  Then [employer] and genitals (private parts) with your normal soap and rinse thoroughly to remove soap.  After that Use CHG Soap as you would any other liquid soap. You can apply CHG directly to the skin and  wash gently with a scrungie or a clean washcloth.   Apply the CHG Soap to your body ONLY FROM THE NECK DOWN.  Do not use on open wounds or open sores. Avoid contact with your eyes, ears, mouth and genitals (private parts). Wash Face and genitals (private parts)  with your normal soap.   Wash thoroughly, paying special attention to the area where your surgery will be performed.  Thoroughly rinse your body with warm water from the neck down.  DO NOT shower/wash with your normal soap after using and rinsing off the CHG Soap.  Pat yourself dry with a CLEAN TOWEL.  Wear CLEAN PAJAMAS to bed the night before surgery  Place CLEAN SHEETS on your bed the night before your surgery  DO NOT SLEEP WITH PETS.   Day of Surgery:  Take a shower with CHG soap. Wear Clean/Comfortable clothing the morning of surgery  Do not wear jewelry or makeup. Do not wear lotions, powders, perfumes/cologne or deodorant. Do not shave 48 hours prior to surgery.  Men may shave face and neck. Do not bring valuables to the hospital. Do not wear nail polish, gel polish, artificial nails, or any other type of covering on natural nails (fingers and toes) If you have artificial nails or gel coating that need to be removed by a nail salon, please have this removed prior to surgery. Artificial nails or gel coating may interfere with anesthesia's ability to adequately monitor your vital signs. Remember to brush your teeth WITH YOUR REGULAR TOOTHPASTE.    If you received a COVID test during your pre-op visit, it is requested that you wear a mask when out in public, stay away from anyone that may not be feeling well, and notify your surgeon if you develop symptoms. If you have been in contact with anyone that has tested positive in the last 10 days, please notify your surgeon.    Please read over the following fact sheets that you were given.

## 2022-08-20 ENCOUNTER — Encounter (HOSPITAL_COMMUNITY)
Admission: RE | Admit: 2022-08-20 | Discharge: 2022-08-20 | Disposition: A | Discharge status: Home / Self Care | Payer: Medicare Other | Source: Ambulatory Visit | Attending: Vascular Surgery | Admitting: Vascular Surgery

## 2022-08-20 ENCOUNTER — Encounter (HOSPITAL_COMMUNITY): Payer: Self-pay

## 2022-08-20 ENCOUNTER — Other Ambulatory Visit: Payer: Self-pay

## 2022-08-20 VITALS — BP 155/78 | HR 81 | Temp 98.1°F | Resp 17 | Ht 72.0 in | Wt 227.0 lb

## 2022-08-20 DIAGNOSIS — I6521 Occlusion and stenosis of right carotid artery: Secondary | ICD-10-CM | POA: Insufficient documentation

## 2022-08-20 DIAGNOSIS — Z01812 Encounter for preprocedural laboratory examination: Secondary | ICD-10-CM | POA: Insufficient documentation

## 2022-08-20 DIAGNOSIS — R011 Cardiac murmur, unspecified: Secondary | ICD-10-CM | POA: Insufficient documentation

## 2022-08-20 DIAGNOSIS — I499 Cardiac arrhythmia, unspecified: Secondary | ICD-10-CM | POA: Insufficient documentation

## 2022-08-20 DIAGNOSIS — G8929 Other chronic pain: Secondary | ICD-10-CM | POA: Insufficient documentation

## 2022-08-20 DIAGNOSIS — Z01818 Encounter for other preprocedural examination: Secondary | ICD-10-CM

## 2022-08-20 DIAGNOSIS — Z9049 Acquired absence of other specified parts of digestive tract: Secondary | ICD-10-CM | POA: Insufficient documentation

## 2022-08-20 DIAGNOSIS — I10 Essential (primary) hypertension: Secondary | ICD-10-CM | POA: Insufficient documentation

## 2022-08-20 DIAGNOSIS — I341 Nonrheumatic mitral (valve) prolapse: Secondary | ICD-10-CM | POA: Insufficient documentation

## 2022-08-20 HISTORY — DX: Occlusion and stenosis of unspecified carotid artery: I65.29

## 2022-08-20 HISTORY — DX: Paroxysmal atrial fibrillation: I48.0

## 2022-08-20 LAB — TYPE AND SCREEN
ABO/RH(D): O POS
Antibody Screen: NEGATIVE

## 2022-08-20 LAB — COMPREHENSIVE METABOLIC PANEL
ALT: 14 U/L (ref 0–44)
AST: 20 U/L (ref 15–41)
Albumin: 3.9 g/dL (ref 3.5–5.0)
Alkaline Phosphatase: 88 U/L (ref 38–126)
Anion gap: 6 (ref 5–15)
BUN: 13 mg/dL (ref 8–23)
CO2: 30 mmol/L (ref 22–32)
Calcium: 9.6 mg/dL (ref 8.9–10.3)
Chloride: 102 mmol/L (ref 98–111)
Creatinine, Ser: 0.75 mg/dL (ref 0.61–1.24)
GFR, Estimated: >60 mL/min (ref >60)
Glucose, Bld: 119 mg/dL — ABNORMAL HIGH (ref 70–99)
Potassium: 4.2 mmol/L (ref 3.5–5.1)
Sodium: 138 mmol/L (ref 135–145)
Total Bilirubin: 1.5 mg/dL — ABNORMAL HIGH (ref 0.3–1.2)
Total Protein: 7 g/dL (ref 6.5–8.1)

## 2022-08-20 LAB — SURGICAL PCR SCREEN
MRSA, PCR: NEGATIVE
Staphylococcus aureus: NEGATIVE

## 2022-08-20 LAB — URINALYSIS, ROUTINE W REFLEX MICROSCOPIC
Bilirubin Urine: NEGATIVE
Glucose, UA: NEGATIVE mg/dL
Hgb urine dipstick: NEGATIVE
Ketones, ur: NEGATIVE mg/dL
Leukocytes,Ua: NEGATIVE
Nitrite: NEGATIVE
Protein, ur: NEGATIVE mg/dL
Specific Gravity, Urine: 1.016 (ref 1.005–1.030)
pH: 6 (ref 5.0–8.0)

## 2022-08-20 LAB — PROTIME-INR
INR: 1.1 (ref 0.8–1.2)
Prothrombin Time: 14 seconds (ref 11.4–15.2)

## 2022-08-20 LAB — CBC
HCT: 40.9 % (ref 39.0–52.0)
Hemoglobin: 13 g/dL (ref 13.0–17.0)
MCH: 25.4 pg — ABNORMAL LOW (ref 26.0–34.0)
MCHC: 31.8 g/dL (ref 30.0–36.0)
MCV: 79.9 fL — ABNORMAL LOW (ref 80.0–100.0)
Platelets: 188 10*3/uL (ref 150–400)
RBC: 5.12 MIL/uL (ref 4.22–5.81)
RDW: 13.6 % (ref 11.5–15.5)
WBC: 6.4 10*3/uL (ref 4.0–10.5)
nRBC: 0 % (ref 0.0–0.2)

## 2022-08-20 LAB — APTT: aPTT: 34 seconds (ref 24–36)

## 2022-08-20 NOTE — Anesthesia Preprocedure Evaluation (Signed)
Anesthesia Evaluation  Patient identified by MRN, date of birth, ID band Patient awake    Reviewed: Allergy & Precautions, NPO status , Patient's Chart, lab work & pertinent test results  Airway Mallampati: II  TM Distance: >3 FB Neck ROM: Full    Dental  (+) Dental Advisory Given, Edentulous Upper, Edentulous Lower   Pulmonary sleep apnea    Pulmonary exam normal breath sounds clear to auscultation       Cardiovascular hypertension, Pt. on medications + CAD and + Peripheral Vascular Disease (Right Carotid Artery Stenosis)  Normal cardiovascular exam+ dysrhythmias (s/p ablation) Atrial Fibrillation and Supra Ventricular Tachycardia + Valvular Problems/Murmurs (s/p complex MV repair using 34 mm Simuform annuloplasty ring 04/23/22)  Rhythm:Regular Rate:Normal     Neuro/Psych  PSYCHIATRIC DISORDERS Anxiety Depression    negative neurological ROS     GI/Hepatic negative GI ROS, Neg liver ROS,,,  Endo/Other  negative endocrine ROS    Renal/GU negative Renal ROS     Musculoskeletal  (+) Arthritis ,    Abdominal   Peds  Hematology  (+) Blood dyscrasia (Plavix, Xarelto)   Anesthesia Other Findings Day of surgery medications reviewed with the patient.  Tonsillar cancer s/p radiation   Reproductive/Obstetrics                             Anesthesia Physical Anesthesia Plan  ASA: 3  Anesthesia Plan: General   Post-op Pain Management: Tylenol PO (pre-op)*   Induction: Intravenous  PONV Risk Score and Plan: 2 and Dexamethasone and Ondansetron  Airway Management Planned: Oral ETT  Additional Equipment: Arterial line  Intra-op Plan:   Post-operative Plan: Extubation in OR  Informed Consent: I have reviewed the patients History and Physical, chart, labs and discussed the procedure including the risks, benefits and alternatives for the proposed anesthesia with the patient or authorized  representative who has indicated his/her understanding and acceptance.     Dental advisory given  Plan Discussed with: CRNA  Anesthesia Plan Comments: (PAT note written 08/20/2022 by Myra Gianotti, PA-C.  )       Anesthesia Quick Evaluation
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
## 2022-08-20 NOTE — Progress Notes (Signed)
PCP - Dr. Tedra Senegal Cardiologist - Dr. Crissie Sickles  PPM/ICD - Denies  Chest x-ray - N/A EKG - 04/26/22 Stress Test - 06/11/20 ECHO - 06/10/22 Cardiac Cath - 02/22/22  Sleep Study - OSA CPAP - No  Diabetes: Denies  Blood Thinner Instructions: LD Xarelto 08/18/22 Aspirin Instructions: Continue ASA and Plavix  ERAS Protcol - No PRE-SURGERY Ensure or G2- No  COVID TEST- N/A   Anesthesia review: Yes, cardiac hx  Patient denies shortness of breath, fever, cough and chest pain at PAT appointment   All instructions explained to the patient, with a verbal understanding of the material. Patient agrees to go over the instructions while at home for a better understanding. Patient also instructed to self quarantine after being tested for COVID-19. The opportunity to ask questions was provided.

## 2022-08-23 ENCOUNTER — Other Ambulatory Visit: Payer: Self-pay

## 2022-08-23 ENCOUNTER — Inpatient Hospital Stay (HOSPITAL_COMMUNITY): Payer: Medicare Other | Admitting: Anesthesiology

## 2022-08-23 ENCOUNTER — Inpatient Hospital Stay (HOSPITAL_COMMUNITY)
Admission: RE | Admit: 2022-08-23 | Discharge: 2022-08-24 | DRG: 036 | Disposition: A | Discharge status: Home / Self Care | Payer: Medicare Other | Attending: Vascular Surgery | Admitting: Vascular Surgery

## 2022-08-23 ENCOUNTER — Inpatient Hospital Stay (HOSPITAL_COMMUNITY): Payer: Medicare Other | Admitting: Vascular Surgery

## 2022-08-23 ENCOUNTER — Inpatient Hospital Stay (HOSPITAL_COMMUNITY): Payer: Medicare Other

## 2022-08-23 ENCOUNTER — Encounter (HOSPITAL_COMMUNITY): Payer: Self-pay | Admitting: Vascular Surgery

## 2022-08-23 ENCOUNTER — Encounter (HOSPITAL_COMMUNITY)
Admission: RE | Disposition: A | Discharge status: Home / Self Care | Payer: Self-pay | Source: Home / Self Care | Attending: Vascular Surgery

## 2022-08-23 DIAGNOSIS — I480 Paroxysmal atrial fibrillation: Secondary | ICD-10-CM | POA: Diagnosis present

## 2022-08-23 DIAGNOSIS — M549 Dorsalgia, unspecified: Secondary | ICD-10-CM | POA: Diagnosis present

## 2022-08-23 DIAGNOSIS — G4730 Sleep apnea, unspecified: Secondary | ICD-10-CM | POA: Diagnosis present

## 2022-08-23 DIAGNOSIS — Z7982 Long term (current) use of aspirin: Secondary | ICD-10-CM | POA: Diagnosis not present

## 2022-08-23 DIAGNOSIS — Z8042 Family history of malignant neoplasm of prostate: Secondary | ICD-10-CM

## 2022-08-23 DIAGNOSIS — F418 Other specified anxiety disorders: Secondary | ICD-10-CM | POA: Diagnosis not present

## 2022-08-23 DIAGNOSIS — I2510 Atherosclerotic heart disease of native coronary artery without angina pectoris: Secondary | ICD-10-CM | POA: Diagnosis not present

## 2022-08-23 DIAGNOSIS — I6521 Occlusion and stenosis of right carotid artery: Secondary | ICD-10-CM

## 2022-08-23 DIAGNOSIS — G8929 Other chronic pain: Secondary | ICD-10-CM | POA: Diagnosis present

## 2022-08-23 DIAGNOSIS — I739 Peripheral vascular disease, unspecified: Secondary | ICD-10-CM

## 2022-08-23 DIAGNOSIS — Z79899 Other long term (current) drug therapy: Secondary | ICD-10-CM | POA: Diagnosis not present

## 2022-08-23 DIAGNOSIS — Z923 Personal history of irradiation: Secondary | ICD-10-CM

## 2022-08-23 DIAGNOSIS — F419 Anxiety disorder, unspecified: Secondary | ICD-10-CM | POA: Diagnosis present

## 2022-08-23 DIAGNOSIS — Z820 Family history of epilepsy and other diseases of the nervous system: Secondary | ICD-10-CM

## 2022-08-23 DIAGNOSIS — Z91013 Allergy to seafood: Secondary | ICD-10-CM | POA: Diagnosis not present

## 2022-08-23 DIAGNOSIS — Z8249 Family history of ischemic heart disease and other diseases of the circulatory system: Secondary | ICD-10-CM | POA: Diagnosis not present

## 2022-08-23 DIAGNOSIS — Z7901 Long term (current) use of anticoagulants: Secondary | ICD-10-CM | POA: Diagnosis not present

## 2022-08-23 DIAGNOSIS — Z8349 Family history of other endocrine, nutritional and metabolic diseases: Secondary | ICD-10-CM | POA: Diagnosis not present

## 2022-08-23 DIAGNOSIS — Z85818 Personal history of malignant neoplasm of other sites of lip, oral cavity, and pharynx: Secondary | ICD-10-CM

## 2022-08-23 DIAGNOSIS — I10 Essential (primary) hypertension: Secondary | ICD-10-CM | POA: Diagnosis present

## 2022-08-23 HISTORY — PX: TRANSCAROTID ARTERY REVASCULARIZATIONÂ: SHX6778

## 2022-08-23 LAB — CREATININE, SERUM
Creatinine, Ser: 0.78 mg/dL (ref 0.61–1.24)
GFR, Estimated: >60 mL/min (ref >60)

## 2022-08-23 LAB — CBC
HCT: 38.6 % — ABNORMAL LOW (ref 39.0–52.0)
Hemoglobin: 13.1 g/dL (ref 13.0–17.0)
MCH: 26.1 pg (ref 26.0–34.0)
MCHC: 33.9 g/dL (ref 30.0–36.0)
MCV: 76.9 fL — ABNORMAL LOW (ref 80.0–100.0)
Platelets: 184 10*3/uL (ref 150–400)
RBC: 5.02 MIL/uL (ref 4.22–5.81)
RDW: 13.6 % (ref 11.5–15.5)
WBC: 7.5 10*3/uL (ref 4.0–10.5)
nRBC: 0 % (ref 0.0–0.2)

## 2022-08-23 LAB — POCT ACTIVATED CLOTTING TIME: Activated Clotting Time: 255 seconds

## 2022-08-23 SURGERY — TRANSCAROTID ARTERY REVASCULARIZATION (TCAR)
Anesthesia: General | Laterality: Right

## 2022-08-23 MED ORDER — GLYCOPYRROLATE PF 0.2 MG/ML IJ SOSY
PREFILLED_SYRINGE | INTRAMUSCULAR | Status: DC | PRN
  Administered 2022-08-23: .2 mg via INTRAVENOUS

## 2022-08-23 MED ORDER — FENTANYL CITRATE (PF) 100 MCG/2ML IJ SOLN
INTRAMUSCULAR | Status: AC
  Filled 2022-08-23: qty 2

## 2022-08-23 MED ORDER — SODIUM CHLORIDE 0.9 % IV SOLN: 500.0000 mL | Freq: Once | INTRAVENOUS | Status: DC | PRN

## 2022-08-23 MED ORDER — ROCURONIUM BROMIDE 10 MG/ML (PF) SYRINGE
PREFILLED_SYRINGE | INTRAVENOUS | Status: AC
  Filled 2022-08-23: qty 10

## 2022-08-23 MED ORDER — IODIXANOL 320 MG/ML IV SOLN
INTRAVENOUS | Status: DC | PRN
  Administered 2022-08-23: 18 mL via INTRA_ARTERIAL

## 2022-08-23 MED ORDER — ONDANSETRON HCL 4 MG/2ML IJ SOLN
INTRAMUSCULAR | Status: DC | PRN
  Administered 2022-08-23: 4 mg via INTRAVENOUS

## 2022-08-23 MED ORDER — LACTATED RINGERS IV SOLN: INTRAVENOUS | Status: DC

## 2022-08-23 MED ORDER — LIDOCAINE 2% (20 MG/ML) 5 ML SYRINGE
INTRAMUSCULAR | Status: AC
  Filled 2022-08-23: qty 5

## 2022-08-23 MED ORDER — FENTANYL CITRATE (PF) 100 MCG/2ML IJ SOLN
25.0000 ug | INTRAMUSCULAR | Status: DC | PRN
  Administered 2022-08-23 (×3): 50 ug via INTRAVENOUS

## 2022-08-23 MED ORDER — SODIUM CHLORIDE 0.9 % IV SOLN: INTRAVENOUS | Status: DC

## 2022-08-23 MED ORDER — 0.9 % SODIUM CHLORIDE (POUR BTL) OPTIME
TOPICAL | Status: DC | PRN
  Administered 2022-08-23: 1000 mL

## 2022-08-23 MED ORDER — PHENYLEPHRINE HCL-NACL 20-0.9 MG/250ML-% IV SOLN
INTRAVENOUS | Status: DC | PRN
  Administered 2022-08-23: 25 ug/min via INTRAVENOUS

## 2022-08-23 MED ORDER — METOPROLOL TARTRATE 5 MG/5ML IV SOLN: 2.0000 mg | INTRAVENOUS | Status: DC | PRN

## 2022-08-23 MED ORDER — HYDROMORPHONE HCL 1 MG/ML IJ SOLN: 0.5000 mg | INTRAMUSCULAR | Status: DC | PRN

## 2022-08-23 MED ORDER — LIDOCAINE 2% (20 MG/ML) 5 ML SYRINGE
INTRAMUSCULAR | Status: DC | PRN
  Administered 2022-08-23: 80 mg via INTRAVENOUS

## 2022-08-23 MED ORDER — LORAZEPAM 0.5 MG PO TABS: 0.5000 mg | ORAL_TABLET | Freq: Two times a day (BID) | ORAL | Status: DC | PRN

## 2022-08-23 MED ORDER — HEPARIN SODIUM (PORCINE) 5000 UNIT/ML IJ SOLN
5000.0000 [IU] | Freq: Three times a day (TID) | INTRAMUSCULAR | Status: DC
  Administered 2022-08-24: 5000 [IU] via SUBCUTANEOUS
  Filled 2022-08-23 (×2): qty 1

## 2022-08-23 MED ORDER — CLOPIDOGREL BISULFATE 75 MG PO TABS
75.0000 mg | ORAL_TABLET | Freq: Every day | ORAL | Status: DC
  Administered 2022-08-24: 75 mg via ORAL
  Filled 2022-08-23: qty 1

## 2022-08-23 MED ORDER — PHENOL 1.4 % MT LIQD: 1.0000 | OROMUCOSAL | Status: DC | PRN

## 2022-08-23 MED ORDER — CLONAZEPAM 0.5 MG PO TABS
0.5000 mg | ORAL_TABLET | Freq: Two times a day (BID) | ORAL | Status: DC | PRN
  Administered 2022-08-24: 0.5 mg via ORAL
  Filled 2022-08-23: qty 1

## 2022-08-23 MED ORDER — ORAL CARE MOUTH RINSE: 15.0000 mL | Freq: Once | OROMUCOSAL | Status: AC

## 2022-08-23 MED ORDER — EPHEDRINE SULFATE-NACL 50-0.9 MG/10ML-% IV SOSY
PREFILLED_SYRINGE | INTRAVENOUS | Status: DC | PRN
  Administered 2022-08-23: 5 mg via INTRAVENOUS

## 2022-08-23 MED ORDER — ONDANSETRON HCL 4 MG/2ML IJ SOLN
INTRAMUSCULAR | Status: AC
  Filled 2022-08-23: qty 2

## 2022-08-23 MED ORDER — ONDANSETRON HCL 4 MG/2ML IJ SOLN: 4.0000 mg | Freq: Four times a day (QID) | INTRAMUSCULAR | Status: DC | PRN

## 2022-08-23 MED ORDER — ACETAMINOPHEN 650 MG RE SUPP: 325.0000 mg | RECTAL | Status: DC | PRN

## 2022-08-23 MED ORDER — BISACODYL 5 MG PO TBEC: 5.0000 mg | DELAYED_RELEASE_TABLET | Freq: Every day | ORAL | Status: DC | PRN

## 2022-08-23 MED ORDER — MAGNESIUM SULFATE 2 GM/50ML IV SOLN: 2.0000 g | Freq: Every day | INTRAVENOUS | Status: DC | PRN

## 2022-08-23 MED ORDER — GLYCOPYRROLATE PF 0.2 MG/ML IJ SOSY
PREFILLED_SYRINGE | INTRAMUSCULAR | Status: AC
  Filled 2022-08-23: qty 1

## 2022-08-23 MED ORDER — PROTAMINE SULFATE 10 MG/ML IV SOLN
INTRAVENOUS | Status: DC | PRN
  Administered 2022-08-23 (×5): 10 mg via INTRAVENOUS

## 2022-08-23 MED ORDER — HEPARIN SODIUM (PORCINE) 1000 UNIT/ML IJ SOLN
INTRAMUSCULAR | Status: DC | PRN
  Administered 2022-08-23: 10000 [IU] via INTRAVENOUS

## 2022-08-23 MED ORDER — ONDANSETRON HCL 4 MG/2ML IJ SOLN: 4.0000 mg | Freq: Once | INTRAMUSCULAR | Status: DC | PRN

## 2022-08-23 MED ORDER — CLEVIDIPINE BUTYRATE 0.5 MG/ML IV EMUL
INTRAVENOUS | Status: DC | PRN
  Administered 2022-08-23: 2 mg/h via INTRAVENOUS

## 2022-08-23 MED ORDER — FENTANYL CITRATE (PF) 250 MCG/5ML IJ SOLN
INTRAMUSCULAR | Status: DC | PRN
  Administered 2022-08-23: 50 ug via INTRAVENOUS

## 2022-08-23 MED ORDER — ACETAMINOPHEN 325 MG PO TABS: 325.0000 mg | ORAL_TABLET | ORAL | Status: DC | PRN

## 2022-08-23 MED ORDER — DOCUSATE SODIUM 100 MG PO CAPS
100.0000 mg | ORAL_CAPSULE | Freq: Every day | ORAL | Status: DC
  Administered 2022-08-24: 100 mg via ORAL
  Filled 2022-08-23: qty 1

## 2022-08-23 MED ORDER — GUAIFENESIN-DM 100-10 MG/5ML PO SYRP: 15.0000 mL | ORAL_SOLUTION | ORAL | Status: DC | PRN

## 2022-08-23 MED ORDER — AMLODIPINE BESYLATE 5 MG PO TABS
5.0000 mg | ORAL_TABLET | Freq: Every day | ORAL | Status: DC
  Administered 2022-08-24: 5 mg via ORAL
  Filled 2022-08-23: qty 1

## 2022-08-23 MED ORDER — OXYCODONE-ACETAMINOPHEN 5-325 MG PO TABS
1.0000 | ORAL_TABLET | ORAL | Status: DC | PRN
  Administered 2022-08-24: 2 via ORAL
  Filled 2022-08-23 (×2): qty 2

## 2022-08-23 MED ORDER — CHLORHEXIDINE GLUCONATE 0.12 % MT SOLN: 15.0000 mL | Freq: Once | OROMUCOSAL | Status: AC

## 2022-08-23 MED ORDER — CEFAZOLIN SODIUM-DEXTROSE 2-4 GM/100ML-% IV SOLN
2.0000 g | INTRAVENOUS | Status: AC
  Administered 2022-08-23: 2 g via INTRAVENOUS
  Filled 2022-08-23: qty 100

## 2022-08-23 MED ORDER — LACTATED RINGERS IV SOLN: INTRAVENOUS | Status: DC | PRN

## 2022-08-23 MED ORDER — HYDRALAZINE HCL 20 MG/ML IJ SOLN
5.0000 mg | INTRAMUSCULAR | Status: DC | PRN
  Administered 2022-08-23: 5 mg via INTRAVENOUS

## 2022-08-23 MED ORDER — ATORVASTATIN CALCIUM 40 MG PO TABS
40.0000 mg | ORAL_TABLET | Freq: Every day | ORAL | Status: DC
  Administered 2022-08-24: 40 mg via ORAL
  Filled 2022-08-23: qty 1

## 2022-08-23 MED ORDER — DEXAMETHASONE SODIUM PHOSPHATE 10 MG/ML IJ SOLN
INTRAMUSCULAR | Status: DC | PRN
  Administered 2022-08-23: 10 mg via INTRAVENOUS

## 2022-08-23 MED ORDER — HYDRALAZINE HCL 20 MG/ML IJ SOLN
INTRAMUSCULAR | Status: AC
  Filled 2022-08-23: qty 1

## 2022-08-23 MED ORDER — PROPOFOL 10 MG/ML IV BOLUS
INTRAVENOUS | Status: DC | PRN
  Administered 2022-08-23: 140 mg via INTRAVENOUS

## 2022-08-23 MED ORDER — POTASSIUM CHLORIDE CRYS ER 20 MEQ PO TBCR: 20.0000 meq | EXTENDED_RELEASE_TABLET | Freq: Every day | ORAL | Status: DC | PRN

## 2022-08-23 MED ORDER — CHLORHEXIDINE GLUCONATE 0.12 % MT SOLN
OROMUCOSAL | Status: AC
  Administered 2022-08-23: 15 mL
  Filled 2022-08-23: qty 15

## 2022-08-23 MED ORDER — HEPARIN 6000 UNIT IRRIGATION SOLUTION
Status: DC | PRN
  Administered 2022-08-23: 1

## 2022-08-23 MED ORDER — CHLORHEXIDINE GLUCONATE CLOTH 2 % EX PADS: 6.0000 | MEDICATED_PAD | Freq: Once | CUTANEOUS | Status: DC

## 2022-08-23 MED ORDER — LABETALOL HCL 5 MG/ML IV SOLN
10.0000 mg | INTRAVENOUS | Status: DC | PRN
  Administered 2022-08-23 (×2): 10 mg via INTRAVENOUS
  Filled 2022-08-23: qty 4

## 2022-08-23 MED ORDER — SUGAMMADEX SODIUM 200 MG/2ML IV SOLN
INTRAVENOUS | Status: DC | PRN
  Administered 2022-08-23: 200 mg via INTRAVENOUS

## 2022-08-23 MED ORDER — EPHEDRINE 5 MG/ML INJ
INTRAVENOUS | Status: AC
  Filled 2022-08-23: qty 5

## 2022-08-23 MED ORDER — DEXAMETHASONE SODIUM PHOSPHATE 10 MG/ML IJ SOLN
INTRAMUSCULAR | Status: AC
  Filled 2022-08-23: qty 1

## 2022-08-23 MED ORDER — HEPARIN 6000 UNIT IRRIGATION SOLUTION
Status: AC
  Filled 2022-08-23: qty 500

## 2022-08-23 MED ORDER — SENNOSIDES-DOCUSATE SODIUM 8.6-50 MG PO TABS: 1.0000 | ORAL_TABLET | Freq: Every evening | ORAL | Status: DC | PRN

## 2022-08-23 MED ORDER — FENTANYL CITRATE (PF) 250 MCG/5ML IJ SOLN
INTRAMUSCULAR | Status: AC
  Filled 2022-08-23: qty 5

## 2022-08-23 MED ORDER — ROCURONIUM BROMIDE 10 MG/ML (PF) SYRINGE
PREFILLED_SYRINGE | INTRAVENOUS | Status: DC | PRN
  Administered 2022-08-23: 70 mg via INTRAVENOUS

## 2022-08-23 MED ORDER — PHENYLEPHRINE 80 MCG/ML (10ML) SYRINGE FOR IV PUSH (FOR BLOOD PRESSURE SUPPORT)
PREFILLED_SYRINGE | INTRAVENOUS | Status: DC | PRN
  Administered 2022-08-23 (×2): 40 ug via INTRAVENOUS
  Administered 2022-08-23 (×2): 80 ug via INTRAVENOUS

## 2022-08-23 MED ORDER — SODIUM CHLORIDE 0.9 % IV SOLN
0.1500 ug/kg/min | INTRAVENOUS | Status: DC
  Administered 2022-08-23: .2 ug/kg/min via INTRAVENOUS
  Filled 2022-08-23 (×2): qty 2000

## 2022-08-23 MED ORDER — PANTOPRAZOLE SODIUM 40 MG PO TBEC
40.0000 mg | DELAYED_RELEASE_TABLET | Freq: Every day | ORAL | Status: DC
  Administered 2022-08-23 – 2022-08-24 (×2): 40 mg via ORAL
  Filled 2022-08-23 (×2): qty 1

## 2022-08-23 MED ORDER — ACETAMINOPHEN 500 MG PO TABS
1000.0000 mg | ORAL_TABLET | Freq: Once | ORAL | Status: AC
  Administered 2022-08-23: 1000 mg via ORAL
  Filled 2022-08-23: qty 2

## 2022-08-23 MED ORDER — LABETALOL HCL 5 MG/ML IV SOLN
INTRAVENOUS | Status: AC
  Filled 2022-08-23: qty 4

## 2022-08-23 MED ORDER — HEMOSTATIC AGENTS (NO CHARGE) OPTIME
TOPICAL | Status: DC | PRN
  Administered 2022-08-23: 1

## 2022-08-23 MED ORDER — PROPOFOL 10 MG/ML IV BOLUS
INTRAVENOUS | Status: AC
  Filled 2022-08-23: qty 20

## 2022-08-23 MED ORDER — ASPIRIN 81 MG PO TBEC
81.0000 mg | DELAYED_RELEASE_TABLET | Freq: Every day | ORAL | Status: DC
  Administered 2022-08-24: 81 mg via ORAL
  Filled 2022-08-23: qty 1

## 2022-08-23 MED ORDER — CEFAZOLIN SODIUM-DEXTROSE 2-4 GM/100ML-% IV SOLN
2.0000 g | Freq: Three times a day (TID) | INTRAVENOUS | Status: AC
  Administered 2022-08-23 – 2022-08-24 (×2): 2 g via INTRAVENOUS
  Filled 2022-08-23 (×2): qty 100

## 2022-08-23 MED ORDER — STERILE WATER FOR IRRIGATION IR SOLN
Status: DC | PRN
  Administered 2022-08-23: 1000 mL

## 2022-08-23 MED ORDER — ALUM & MAG HYDROXIDE-SIMETH 200-200-20 MG/5ML PO SUSP: 15.0000 mL | ORAL | Status: DC | PRN

## 2022-08-23 SURGICAL SUPPLY — 50 items
ADH SKN CLS APL DERMABOND .7 (GAUZE/BANDAGES/DRESSINGS) ×2
BAG BANDED W/RUBBER/TAPE 36X54 (MISCELLANEOUS) ×1 IMPLANT
BAG COUNTER SPONGE SURGICOUNT (BAG) ×1 IMPLANT
BAG EQP BAND 135X91 W/RBR TAPE (MISCELLANEOUS) ×1
BAG SPNG CNTER NS LX DISP (BAG) ×1
BALLN STERLING RX 5X30X80 (BALLOONS) ×1
BALLOON STERLING RX 5X30X80 (BALLOONS) IMPLANT
CANISTER SUCT 3000ML PPV (MISCELLANEOUS) ×1 IMPLANT
CLIP TI MEDIUM 6 (CLIP) ×1 IMPLANT
CLIP TI WIDE RED SMALL 6 (CLIP) ×1 IMPLANT
COVER DOME SNAP 22 D (MISCELLANEOUS) ×1 IMPLANT
COVER PROBE W GEL 5X96 (DRAPES) ×1 IMPLANT
DERMABOND ADVANCED .7 DNX12 (GAUZE/BANDAGES/DRESSINGS) ×1 IMPLANT
DRAPE FEMORAL ANGIO 80X135IN (DRAPES) ×1 IMPLANT
ELECT REM PT RETURN 9FT ADLT (ELECTROSURGICAL) ×1
ELECTRODE REM PT RTRN 9FT ADLT (ELECTROSURGICAL) ×1 IMPLANT
GLOVE BIO SURGEON STRL SZ 6.5 (GLOVE) IMPLANT
GLOVE BIO SURGEON STRL SZ7.5 (GLOVE) ×1 IMPLANT
GLOVE BIOGEL PI IND STRL 7.0 (GLOVE) IMPLANT
GOWN STRL REUS W/ TWL LRG LVL3 (GOWN DISPOSABLE) ×2 IMPLANT
GOWN STRL REUS W/ TWL XL LVL3 (GOWN DISPOSABLE) ×1 IMPLANT
GOWN STRL REUS W/TWL LRG LVL3 (GOWN DISPOSABLE) ×2
GOWN STRL REUS W/TWL XL LVL3 (GOWN DISPOSABLE) ×1
GUIDEWIRE ENROUTE 0.014 (WIRE) ×1 IMPLANT
HEMOSTAT SNOW SURGICEL 2X4 (HEMOSTASIS) IMPLANT
INTRODUCER KIT GALT 7CM (INTRODUCER) ×1
KIT BASIN OR (CUSTOM PROCEDURE TRAY) ×1 IMPLANT
KIT ENCORE 26 ADVANTAGE (KITS) ×1 IMPLANT
KIT INTRODUCER GALT 7 (INTRODUCER) ×1 IMPLANT
KIT TURNOVER KIT B (KITS) ×1 IMPLANT
NDL HYPO 25GX1X1/2 BEV (NEEDLE) IMPLANT
NEEDLE HYPO 25GX1X1/2 BEV (NEEDLE) IMPLANT
PACK CAROTID (CUSTOM PROCEDURE TRAY) ×1 IMPLANT
POSITIONER HEAD DONUT 9IN (MISCELLANEOUS) ×1 IMPLANT
PROTECTION STATION PRESSURIZED (MISCELLANEOUS) ×1
SET MICROPUNCTURE 5F STIFF (MISCELLANEOUS) ×1 IMPLANT
STATION PROTECTION PRESSURIZED (MISCELLANEOUS) ×1 IMPLANT
SUT MNCRL AB 4-0 PS2 18 (SUTURE) ×1 IMPLANT
SUT PROLENE 5 0 C 1 24 (SUTURE) ×1 IMPLANT
SUT SILK 2 0 PERMA HAND 18 BK (SUTURE) ×1 IMPLANT
SUT VIC AB 3-0 SH 27 (SUTURE) ×1
SUT VIC AB 3-0 SH 27X BRD (SUTURE) ×1 IMPLANT
SYR 10ML LL (SYRINGE) ×3 IMPLANT
SYR 20ML LL LF (SYRINGE) ×1 IMPLANT
SYR CONTROL 10ML LL (SYRINGE) IMPLANT
SYSTEM TRANSCAROTID NEUROPRTCT (MISCELLANEOUS) ×1 IMPLANT
TOWEL GREEN STERILE (TOWEL DISPOSABLE) ×1 IMPLANT
TRANSCAROTID NEUROPROTECT SYS (MISCELLANEOUS) ×1
WATER STERILE IRR 1000ML POUR (IV SOLUTION) ×1 IMPLANT
WIRE BENTSON .035X145CM (WIRE) ×1 IMPLANT

## 2022-08-23 NOTE — Progress Notes (Signed)
  Progress Note    08/23/2022 2:37 PM Day of Surgery  Subjective:  seen in PACU. No complaints. Waiting on available bed on floor   Vitals:   08/23/22 1330 08/23/22 1430  BP: (!) 154/78 (!) 153/83  Pulse: 78 73  Resp: 19 (!) 23  Temp:    SpO2: 96% 93%   Physical Exam: Cardiac:  regular Lungs:  non labored Incisions:  right neck incision is clean, dry and intact. No swelling or hematoma Extremities:  moving all extremities without deficits Neurologic: alert and oriented. Speech  coherent. Smile symmetric. Tongue midline  CBC    Component Value Date/Time   WBC 6.4 08/20/2022 0930   RBC 5.12 08/20/2022 0930   HGB 13.0 08/20/2022 0930   HGB 14.6 02/19/2022 1134   HGB 13.6 09/19/2013 1105   HCT 40.9 08/20/2022 0930   HCT 44.1 02/19/2022 1134   HCT 41.1 09/19/2013 1105   PLT 188 08/20/2022 0930   PLT 227 02/19/2022 1134   MCV 79.9 (L) 08/20/2022 0930   MCV 83 02/19/2022 1134   MCV 80.7 09/19/2013 1105   MCH 25.4 (L) 08/20/2022 0930   MCHC 31.8 08/20/2022 0930   RDW 13.6 08/20/2022 0930   RDW 13.1 02/19/2022 1134   RDW 13.9 09/19/2013 1105   LYMPHSABS 984 03/22/2022 0926   LYMPHSABS 0.7 (L) 09/19/2013 1105   MONOABS 0.6 04/27/2019 1342   MONOABS 0.4 09/19/2013 1105   EOSABS 684 (H) 03/22/2022 0926   EOSABS 0.2 09/19/2013 1105   BASOSABS 48 03/22/2022 0926   BASOSABS 0.0 09/19/2013 1105    BMET    Component Value Date/Time   NA 138 08/20/2022 0930   NA 141 02/19/2022 1134   NA 140 09/19/2013 1105   K 4.2 08/20/2022 0930   K 3.9 09/19/2013 1105   CL 102 08/20/2022 0930   CL 103 09/20/2012 0953   CO2 30 08/20/2022 0930   CO2 29 09/19/2013 1105   GLUCOSE 119 (H) 08/20/2022 0930   GLUCOSE 83 09/19/2013 1105   GLUCOSE 103 (H) 09/20/2012 0953   BUN 13 08/20/2022 0930   BUN 15 02/19/2022 1134   BUN 11.2 09/19/2013 1105   CREATININE 0.75 08/20/2022 0930   CREATININE 0.79 03/22/2022 0926   CREATININE 1.0 09/19/2013 1105   CALCIUM 9.6 08/20/2022 0930    CALCIUM 9.7 09/19/2013 1105   GFRNONAA >60 08/20/2022 0930   GFRNONAA 85 08/17/2019 1041   GFRAA 87 05/20/2020 0938   GFRAA 98 08/17/2019 1041    INR    Component Value Date/Time   INR 1.1 08/20/2022 0930     Intake/Output Summary (Last 24 hours) at 08/23/2022 1437 Last data filed at 08/23/2022 1300 Gross per 24 hour  Intake 100 ml  Output 350 ml  Net -250 ml     Assessment/Plan:  72 y.o. male is s/p R TCAR Day of Surgery   Neurologically intact Incision is c/d/I without swelling or hematoma Hemodynamically stable VSS Routine post operative care To 4E when bed available   Karoline Caldwell, PA-C [facility] [phone] 08/23/2022 2:37 PM

## 2022-08-23 NOTE — Op Note (Signed)
Date: August 23, 2022  Preoperative diagnosis: High-grade asymptomatic right internal carotid artery stenosis greater than 80%  Postoperative diagnosis: Same  Procedure: 1.  Ultrasound-guided access left common femoral vein for delivery of TCAR venous sheath for flow reversal 2.  Transcatheter placement of cervical carotid stent after cutdown with angioplasty using distal embolic protection with flow reversal (right TCAR)  Surgeon: Dr. Marty Heck, MD  Assistant: Paulo Fruit, PA  Indications: 72 year old male seen for evaluation of his carotid artery disease with an asymptomatic high-grade right internal carotid artery stenosis >80%.  He had previous neck radiation for tonsillar cancer.  Subsequently recommended right TCAR after risks benefits discussed.  An assistant was needed for cutdown on the common carotid artery, access of the artery, and for wire and sheath exchange.  Findings: Ultrasound-guided access left common femoral vein for placement of TCAR venous sheath.  Transverse incision above the right clavicle with a cutdown on the common carotid artery that was subsequently accessed with a pursestring for placement of an arterial working sheath.  Once we were on the active flow reversal for distal embolic protection the lesion was crossed antegrade and it was angioplastied with a 5 mm x 30 mm angioplasty balloon and stented with a 7 mm x 9 mm x 40 mm length Enroute stent.  Widely patent stent at completion.  No significant residual stenosis.  Anesthesia: General  Details: Patient was taken to the operating room after informed consent was obtained.  Placed on the operating table in the supine position.  General endotracheal anesthesia was induced.  I used ultrasound to mark the common carotid artery just above the right clavicle.  The right neck and bilateral groins were then prepped and draped in standard sterile fashion.  Antibiotics were given.  Timeout performed.  Initially  evaluated the left common femoral vein with ultrasound, it was patent and image was saved.  This was accessed with a micro access needle, placed a microwire, and then a microsheath I then placed a Bentson wire and a venous TCAR sheath in the left groin.  I then cut down on the right neck with a transverse incision 1 fingerbreadth above the clavicle.  Dissected down with Bovie cautery opened the platysma.  I dissected between the heads of the sternocleidomastoid and dissected down and dissected the internal jugular vein and vagus nerve laterally to identify the common carotid artery.  Common carotid artery was then dissected free with the help of my assistant.  Patient was given 100 units/kg IV heparin.  I then controlled the common carotid artery with a large vessel loop as well as an umbilical tape.  A pursestring was placed in the common carotid artery in the base of the wound with a 5-0 Prolene.  This was accessed with a micro access needle, placed a microwire, and a microsheath.  I then got dedicated carotid imaging identifying the carotid bifurcation and the high-grade stenosis in the right internal carotid artery.  I then advanced a J-wire to just below the bifurcation and upsized to the arterial working sheath.  This was secured with multiple silk sutures.  I then went on the passive reversal after we connected the filter from the arterial sheath in the neck to the venous sheath in the groin.  We then got multiple images to identify the lesion with steep RAO positioning of the C arm.  Once we confirmed a therapeutic ACT there was a TCAR timeout performed.  The lesion was crossed antegrade with a wire  and we angioplastied this with a 5 mm x 30 mm angioplasty balloon.  This was then stented with a 7 mm x 9 mm x 40 mm length tapered carotid Enroute stent with the help of my assistant.  We allowed 2 minutes of additional flow reversal.  We got a final image that showed widely patent stent with good antegrade flow  in the carotid artery and no evidence of dissection.  Total flow reversal time was 8 minutes.  Wires were removed and we came off active clamp.  We disconnected the sheath and gave the blood back in the venous sheath from the filter.  We then remove the arterial sheath in the neck tied down the pursestring with good hemostasis.  I listen with pencil Doppler and there was a good signal here.  Ultimately protamine was given.  The femoral sheath in the left groin was removed holding manual pressure.  Hemostasis in the neck with Surgicel snow and closed the platysma with 3-0 Vicryl and skin with 4-0 Monocryl.  Awakened neurologically intact.  Taken to recovery in stable condition.  Complications: None  Condition: Stable  Marty Heck, MD [facility] [place] Office: [place]

## 2022-08-23 NOTE — Anesthesia Procedure Notes (Signed)
Arterial Line Insertion Start/End3/04/2023 8:55 AM, 08/23/2022 9:05 AM Performed by: Junie Bame, RN  Patient location: Pre-op. Preanesthetic checklist: patient identified, IV checked, site marked, risks and benefits discussed, surgical consent, monitors and equipment checked, pre-op evaluation, timeout performed and anesthesia consent Lidocaine 1% used for infiltration Right, radial was placed Catheter size: 20 G Hand hygiene performed  and maximum sterile barriers used   Attempts: 1 Procedure performed without using ultrasound guided technique. Following insertion, dressing applied and Biopatch. Post procedure assessment: normal and unchanged  Patient tolerated the procedure well with no immediate complications.

## 2022-08-23 NOTE — Discharge Instructions (Signed)
[facility]  Discharge Instructions   Carotid Surgery  Please refer to the following instructions for your post-procedure care. Your surgeon or physician assistant will discuss any changes with you.  Activity  You are encouraged to walk as much as you can. You can slowly return to normal activities but must avoid strenuous activity and heavy lifting until your doctor tell you it's okay. Avoid activities such as vacuuming or swinging a golf club. You can drive after one week if you are comfortable and you are no longer taking prescription pain medications. It is normal to feel tired for serval weeks after your surgery. It is also normal to have difficulty with sleep habits, eating, and bowel movements after surgery. These will go away with time.  Bathing/Showering  Shower daily after you go home. Do not soak in a bathtub, hot tub, or swim until the incision heals completely.  Incision Care  Shower every day. Clean your incision with mild soap and water. Pat the area dry with a clean towel. You do not need a bandage unless otherwise instructed. Do not apply any ointments or creams to your incision. You may have skin glue on your incision. Do not peel it off. It will come off on its own in about one week. Your incision may feel thickened and raised for several weeks after your surgery. This is normal and the skin will soften over time.   For Men Only: It's okay to shave around the incision but do not shave the incision itself for 2 weeks. It is common to have numbness under your chin that could last for several months.  Diet  Resume your normal diet. There are no special food restrictions following this procedure. A low fat/low cholesterol diet is recommended for all patients with vascular disease. In order to heal from your surgery, it is CRITICAL to get adequate nutrition. Your body requires vitamins, minerals, and protein. Vegetables are the best source of  vitamins and minerals. Vegetables also provide the perfect balance of protein. Processed food has little nutritional value, so try to avoid this.  Medications  Resume taking all of your medications unless your doctor or physician assistant tells you not to. If your incision is causing pain, you may take over-the- counter pain relievers such as acetaminophen (Tylenol). If you were prescribed a stronger pain medication, please be aware these medications can cause nausea and constipation. Prevent nausea by taking the medication with a snack or meal. Avoid constipation by drinking plenty of fluids and eating foods with a high amount of fiber, such as fruits, vegetables, and grains.  Do not take Tylenol if you are taking prescription pain medications.  Restart taking your Xarelto tonight, 08/24/2022.  Continue aspirin, plavix and Xarelto until your follow up appointment.  Follow Up  Our office will schedule a follow up appointment 2-3 weeks following discharge.  Please call us immediately for any of the following conditions  Increased pain, redness, drainage (pus) from your incision site. Fever of 101 degrees or higher. If you should develop stroke (slurred speech, difficulty swallowing, weakness on one side of your body, loss of vision) you should call 911 and go to the nearest emergency room.  Reduce your risk of vascular disease:  Stop smoking. If you would like help call [facility] at 1-800-QUIT-NOW 201-556-8461) or [place] at [phone]. Manage your cholesterol Maintain a desired weight Control your diabetes Keep your blood pressure down  If you have any questions, please  call the office at [phone].

## 2022-08-23 NOTE — Anesthesia Procedure Notes (Signed)
Procedure Name: Intubation Date/Time: 08/23/2022 9:42 AM  Performed by: Harden Mo, CRNAPre-anesthesia Checklist: Patient identified, Emergency Drugs available, Suction available and Patient being monitored Patient Re-evaluated:Patient Re-evaluated prior to induction Oxygen Delivery Method: Circle System Utilized Preoxygenation: Pre-oxygenation with 100% oxygen Induction Type: IV induction Laryngoscope Size: Miller and 2 Grade View: Grade I Tube type: Oral Tube size: 7.5 mm Number of attempts: 1 Airway Equipment and Method: Stylet and Oral airway Placement Confirmation: ETT inserted through vocal cords under direct vision, positive ETCO2 and breath sounds checked- equal and bilateral Secured at: 23 cm Tube secured with: Tape Dental Injury: Teeth and Oropharynx as per pre-operative assessment

## 2022-08-23 NOTE — Transfer of Care (Signed)
Immediate Anesthesia Transfer of Care Note  Patient: Douglas Waters  Procedure(s) Performed: Right Transcarotid Artery Revascularization (Right)  Patient Location: PACU  Anesthesia Type:General  Level of Consciousness: awake, alert , and oriented  Airway & Oxygen Therapy: Patient Spontanous Breathing  Post-op Assessment: Report given to RN, Post -op Vital signs reviewed and stable, Patient moving all extremities X 4, and Patient able to stick tongue midline  Post vital signs: Reviewed and stable  Last Vitals:  Vitals Value Taken Time  BP 159/82 08/23/22 1118  Temp    Pulse 78 08/23/22 1124  Resp 17 08/23/22 1124  SpO2 96 % 08/23/22 1124  Vitals shown include unvalidated device data.  Last Pain:  Vitals:   08/23/22 0822  TempSrc:   PainSc: 5       Patients Stated Pain Goal: 2 ([id number])  Complications: No notable events documented.
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
## 2022-08-23 NOTE — Anesthesia Postprocedure Evaluation (Signed)
Anesthesia Post Note  Patient: Douglas Waters  Procedure(s) Performed: Right Transcarotid Artery Revascularization (Right)     Patient location during evaluation: PACU Anesthesia Type: General Level of consciousness: awake and alert Pain management: pain level controlled Vital Signs Assessment: post-procedure vital signs reviewed and stable Respiratory status: spontaneous breathing, nonlabored ventilation, respiratory function stable and patient connected to nasal cannula oxygen Cardiovascular status: blood pressure returned to baseline and stable Postop Assessment: no apparent nausea or vomiting Anesthetic complications: no   No notable events documented.  Last Vitals:  Vitals:   08/23/22 0758 08/23/22 1118  BP: (!) 171/83 (!) 159/82  Pulse: 77 80  Resp: 18 (!) 24  Temp: 36.6 C 36.4 C  SpO2: 96% 94%    Last Pain:  Vitals:   08/23/22 0822  TempSrc:   PainSc: [place]

## 2022-08-24 ENCOUNTER — Encounter (HOSPITAL_COMMUNITY): Payer: Self-pay | Admitting: Vascular Surgery

## 2022-08-24 LAB — LIPID PANEL
Cholesterol: 119 mg/dL (ref 0–200)
HDL: 46 mg/dL (ref >40)
LDL Cholesterol: 66 mg/dL (ref 0–99)
Total CHOL/HDL Ratio: 2.6 RATIO
Triglycerides: 35 mg/dL (ref <150)
VLDL: 7 mg/dL (ref 0–40)

## 2022-08-24 LAB — BASIC METABOLIC PANEL
Anion gap: 12 (ref 5–15)
BUN: 12 mg/dL (ref 8–23)
CO2: 23 mmol/L (ref 22–32)
Calcium: 9.5 mg/dL (ref 8.9–10.3)
Chloride: 102 mmol/L (ref 98–111)
Creatinine, Ser: 0.83 mg/dL (ref 0.61–1.24)
GFR, Estimated: >60 mL/min (ref >60)
Glucose, Bld: 136 mg/dL — ABNORMAL HIGH (ref 70–99)
Potassium: 3.2 mmol/L — ABNORMAL LOW (ref 3.5–5.1)
Sodium: 137 mmol/L (ref 135–145)

## 2022-08-24 LAB — CBC
HCT: 38.8 % — ABNORMAL LOW (ref 39.0–52.0)
Hemoglobin: 12.7 g/dL — ABNORMAL LOW (ref 13.0–17.0)
MCH: 25.5 pg — ABNORMAL LOW (ref 26.0–34.0)
MCHC: 32.7 g/dL (ref 30.0–36.0)
MCV: 77.8 fL — ABNORMAL LOW (ref 80.0–100.0)
Platelets: 206 10*3/uL (ref 150–400)
RBC: 4.99 MIL/uL (ref 4.22–5.81)
RDW: 13.8 % (ref 11.5–15.5)
WBC: 10 10*3/uL (ref 4.0–10.5)
nRBC: 0 % (ref 0.0–0.2)

## 2022-08-24 NOTE — Progress Notes (Signed)
Discharge instructions (including medications) discussed with and copy provided to patient/caregiver 

## 2022-08-24 NOTE — Progress Notes (Addendum)
Progress Note    08/24/2022 6:50 AM 1 Day Post-Op  Subjective:  no complaints; voiding and has walked  Afebrile HR 70's-80's  [id number] systolic [id number] RA  Vitals:   08/24/22 0218 08/24/22 0400  BP: (!) 139/58 (!) 122/49  Pulse: 80 70  Resp: (!) 22 18  Temp:  98.4 F (36.9 C)  SpO2:      Physical Exam: General:  no distress Lungs:  non labored Incisions:  right neck incision is clean and dry Extremities:  moving all extremities equally   CBC    Component Value Date/Time   WBC 10.0 08/24/2022 0225   RBC 4.99 08/24/2022 0225   HGB 12.7 (L) 08/24/2022 0225   HGB 14.6 02/19/2022 1134   HGB 13.6 09/19/2013 1105   HCT 38.8 (L) 08/24/2022 0225   HCT 44.1 02/19/2022 1134   HCT 41.1 09/19/2013 1105   PLT 206 08/24/2022 0225   PLT 227 02/19/2022 1134   MCV 77.8 (L) 08/24/2022 0225   MCV 83 02/19/2022 1134   MCV 80.7 09/19/2013 1105   MCH 25.5 (L) 08/24/2022 0225   MCHC 32.7 08/24/2022 0225   RDW 13.8 08/24/2022 0225   RDW 13.1 02/19/2022 1134   RDW 13.9 09/19/2013 1105   LYMPHSABS 984 03/22/2022 0926   LYMPHSABS 0.7 (L) 09/19/2013 1105   MONOABS 0.6 04/27/2019 1342   MONOABS 0.4 09/19/2013 1105   EOSABS 684 (H) 03/22/2022 0926   EOSABS 0.2 09/19/2013 1105   BASOSABS 48 03/22/2022 0926   BASOSABS 0.0 09/19/2013 1105    BMET    Component Value Date/Time   NA 137 08/24/2022 0225   NA 141 02/19/2022 1134   NA 140 09/19/2013 1105   K 3.2 (L) 08/24/2022 0225   K 3.9 09/19/2013 1105   CL 102 08/24/2022 0225   CL 103 09/20/2012 0953   CO2 23 08/24/2022 0225   CO2 29 09/19/2013 1105   GLUCOSE 136 (H) 08/24/2022 0225   GLUCOSE 83 09/19/2013 1105   GLUCOSE 103 (H) 09/20/2012 0953   BUN 12 08/24/2022 0225   BUN 15 02/19/2022 1134   BUN 11.2 09/19/2013 1105   CREATININE 0.83 08/24/2022 0225   CREATININE 0.79 03/22/2022 0926   CREATININE 1.0 09/19/2013 1105   CALCIUM 9.5 08/24/2022 0225   CALCIUM 9.7 09/19/2013 1105   GFRNONAA >60 08/24/2022 0225    GFRNONAA 85 08/17/2019 1041   GFRAA 87 05/20/2020 0938   GFRAA 98 08/17/2019 1041    INR    Component Value Date/Time   INR 1.1 08/20/2022 0930     Intake/Output Summary (Last 24 hours) at 08/24/2022 0650 Last data filed at 08/24/2022 0540 Gross per 24 hour  Intake 100 ml  Output 1225 ml  Net -1125 ml      Assessment/Plan:  72 y.o. male is s/p:  Right TCAR for asymptomatic carotid artery stenosis  1 Day Post-Op   -pt doing well this morning and neuro in tact.  Right neck incision looks good.  -plan for discharge home.  No pain medication given as PDMP reviewed.  Continue asa/plavix/statin. (Continue plavix for 4 weeks then discontinue and continue asa/statin/Xarelto.   -f/u in 4 weeks on Dr. Carlis Abbott clinic day.  He will call sooner if any issues before then.   -discussed that the bilateral temporal pain he had prior to surgery most likely not related to stroke or carotid disease.   Leontine Locket, PA-C [facility] [phone] 08/24/2022 6:50 AM  I have seen and evaluated the  patient. I agree with the PA note as documented above.  Postop day 1 status post right TCAR for an asymptomatic high-grade stenosis with history of neck radiation.  Looks great this morning.  Neck incision and groin incision look good.  Neurologically intact.  He has voided and walked.  Ready for discharge.  Plan follow-up in 1 month with carotid duplex in the office.  Discussed aspirin statin Plavix he can resume his Xarelto tonight.  If the stent looks good at the 1 month follow-up we will plan to stop his Plavix.  Marty Heck, MD [facility] Office: [phone]

## 2022-08-24 NOTE — Plan of Care (Signed)
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
## 2022-08-24 NOTE — TOC Transition Note (Signed)
Transition of Care (TOC) - CM/SW Discharge Note Marvetta Gibbons RN, BSN Transitions of Care Unit 4E- RN Case Manager See Treatment Team for direct phone #   Patient Details  Name: Douglas Waters MRN: [id number] Date of Birth: [birth year]  Transition of Care [facility]) CM/SW Contact:  Dawayne Patricia, RN Phone Number: 08/24/2022, 9:34 AM   Clinical Narrative:    Pt stable for transition home today, Transition of Care Department [facility]) has reviewed patient and no TOC needs have been identified at this time. Pt to follow up as per AVS instructions. Spouse to transport home   Final next level of care: Home/Self Care Barriers to Discharge: No Barriers Identified   Patient Goals and CMS Choice CMS Medicare.gov Compare Post Acute Care list provided to:: Patient Choice offered to / list presented to : NA  Discharge Placement                 Home        Discharge Plan and Services Additional resources added to the After Visit Summary for                  DME Arranged: N/A DME Agency: NA       HH Arranged: NA HH Agency: NA        Social Determinants of Health (SDOH) Interventions SDOH Screenings   Food Insecurity: No Food Insecurity (08/23/2022)  Housing: [place]  (08/23/2022)  Transportation Needs: No Transportation Needs (08/23/2022)  Utilities: Not At Risk (08/23/2022)  Alcohol Screen: Low Risk  (03/16/2021)  Depression (PHQ2-9): Low Risk  (03/29/2022)  Tobacco Use: Medium Risk (08/24/2022)     Readmission Risk Interventions    08/24/2022    9:34 AM  Readmission Risk Prevention Plan  Post Dischage Appt Complete  Medication Screening Complete  Transportation Screening Complete

## 2022-08-24 NOTE — Progress Notes (Signed)
PHARMACIST LIPID MONITORING   Douglas Waters is a 72 y.o. male admitted on 08/23/2022 with rigth transcarotid arter revascularization.  Pharmacy has been consulted to optimize lipid-lowering therapy with the indication of secondary prevention for clinical ASCVD.  Recent Labs:  Lipid Panel (last 6 months):   Lab Results  Component Value Date   CHOL 119 08/24/2022   TRIG 35 08/24/2022   HDL 46 08/24/2022   CHOLHDL 2.6 08/24/2022   VLDL 7 08/24/2022   LDLCALC 66 08/24/2022    Hepatic function panel (last 6 months):   Lab Results  Component Value Date   AST 20 08/20/2022   ALT 14 08/20/2022   ALKPHOS 88 08/20/2022   BILITOT 1.5 (H) 08/20/2022    SCr (since admission):   Serum creatinine: 0.83 mg/dL 08/24/22 0225 Estimated creatinine clearance: 100.6 mL/min  Current therapy and lipid therapy tolerance Current lipid-lowering therapy: Atorvastatin '40mg'$  po daily Previous lipid-lowering therapies (if applicable): None Documented or reported allergies or intolerances to lipid-lowering therapies (if applicable): none reported  Assessment:   Patient on high intensity statin and just started therapy on 08/17/22.   Plan:    1.Statin intensity (high intensity recommended for all patients regardless of the LDL):  No statin changes. The patient is already on a high intensity statin.  2.Add ezetimibe (if any one of the following):   Not indicated at this time.  3.Refer to lipid clinic:   No  4.Follow-up with:  Primary care provider - Baxley, Cresenciano Lick, MD  5.Follow-up labs after discharge:  No changes in lipid therapy, repeat a lipid panel in one year.      Sloan Leiter, PharmD, BCPS, BCCCP Clinical Pharmacist Please refer to [facility] for Price numbers 08/24/2022, 7:16 AM

## 2022-08-31 ENCOUNTER — Ambulatory Visit: Payer: Self-pay | Admitting: *Deleted

## 2022-09-06 ENCOUNTER — Encounter (HOSPITAL_COMMUNITY): Payer: Self-pay | Admitting: Vascular Surgery

## 2022-09-16 ENCOUNTER — Other Ambulatory Visit: Payer: Self-pay | Admitting: *Deleted

## 2022-09-16 DIAGNOSIS — I6521 Occlusion and stenosis of right carotid artery: Secondary | ICD-10-CM

## 2022-09-27 ENCOUNTER — Other Ambulatory Visit: Payer: Self-pay | Admitting: *Deleted

## 2022-09-27 DIAGNOSIS — I6521 Occlusion and stenosis of right carotid artery: Secondary | ICD-10-CM

## 2022-09-27 NOTE — Progress Notes (Unsigned)
  TCAR post operative follow up    CC:  follow up. Requesting Provider:  [name], MD  HPI: This is a 72 y.o. male here for follow up for carotid artery stenosis.  Pt is s/p right TCAR for asymptomatic carotid artery stenosis on 08/23/2022 by Dr. Chestine Spore.    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  He was previously seen in the hospital for asymptomatic high-grade right ICA stenosis from a duplex on 04/07/2022 prior to open valve repair.  He was scheduled for complex mitral valve repair and underwent surgery on 04/23/2022 with Dr. Leafy Ro.  He did well after surgery   Pt returns today for follow up.    Pt denies any amaurosis fugax, speech difficulties, weakness, numbness, paralysis or clumsiness or facial droop.  He states he has been doing well since surgery.    He stopped his Plavix after 30 days.  He has continued with his Xarelto and aspirin.    The pt is on a statin for cholesterol management.  The pt is on a daily aspirin.   Other AC:  Plavix/Xarelto The pt is on CCB for hypertension.   The pt is not on medication for diabetes Tobacco hx:  never   PHYSICAL EXAMINATION:  Today's Vitals   09/28/22 0926 09/28/22 0928  BP: 125/76 130/78  Pulse: 64   Resp: 18   Temp: (!) 97.4 F (36.3 C)   TempSrc: Temporal   SpO2: (!) 9%   Weight: 224 lb (101.6 kg)   Height: 6' (1.829 m)   PainSc: 0-No pain    Body mass index is 30.38 kg/m.   General:  WDWN in NAD; vital signs documented above Gait: Not observed HENT: WNL, normocephalic Pulmonary: normal non-labored breathing Cardiac: regular HR, with carotid bruit on the left Skin: without rashes Extremities: moving all extremities equally Neurologic: A&O X 3; moving all extremities equally; speech is fluent/normal Psychiatric:  The pt has Normal affect.   Non-Invasive Vascular Imaging:   Carotid  Duplex on 09/28/2022 Right:  patent stent Left:  1-39% ICA stenosis Vertebrals:  Bilateral vertebral arteries demonstrate antegrade flow.  Subclavians: Normal flow hemodynamics were seen in bilateral subclavian arteries.   Previous Carotid duplex on 04/07/2022: Right: 80-99% ICA stenosis Left:   1-39% ICA stenosis    ASSESSMENT/PLAN:: 72 y.o. male here for follow up carotid artery stenosis and is s/p right TCAR for asymptomatic carotid artery stenosis on 08/23/2022 by Dr. Chestine Spore   -duplex today reveals patent stent on the right and 1-39% left ICA stenosis -discussed s/s of stroke with pt and he understands should he develop any of these sx, he will go to the nearest ER or call 911. -pt will f/u in 9 months with carotid duplex -pt will call sooner should he have any issues. -continue statin/asa/Xarelto.  Okay to stop Plavix at this point and continue with baby asa daily.  Discussed this with he and his wife.   Doreatha Massed, [facility] [phone]  Clinic MD:  Chestine Spore

## 2022-09-28 ENCOUNTER — Ambulatory Visit (INDEPENDENT_AMBULATORY_CARE_PROVIDER_SITE_OTHER): Payer: Medicare Other | Admitting: Physician Assistant

## 2022-09-28 ENCOUNTER — Ambulatory Visit (HOSPITAL_COMMUNITY)
Admission: RE | Admit: 2022-09-28 | Discharge: 2022-09-28 | Disposition: A | Discharge status: Home / Self Care | Payer: Medicare Other | Source: Ambulatory Visit | Attending: Vascular Surgery | Admitting: Vascular Surgery

## 2022-09-28 VITALS — BP 130/78 | HR 64 | Temp 97.4°F | Resp 18 | Ht 72.0 in | Wt 224.0 lb

## 2022-09-28 DIAGNOSIS — I6521 Occlusion and stenosis of right carotid artery: Secondary | ICD-10-CM

## 2022-10-08 ENCOUNTER — Other Ambulatory Visit: Payer: Self-pay

## 2022-10-08 DIAGNOSIS — I6521 Occlusion and stenosis of right carotid artery: Secondary | ICD-10-CM

## 2022-10-27 ENCOUNTER — Other Ambulatory Visit: Payer: Self-pay | Admitting: Internal Medicine

## 2022-10-27 DIAGNOSIS — I10 Essential (primary) hypertension: Secondary | ICD-10-CM

## 2022-11-19 ENCOUNTER — Telehealth: Payer: Self-pay

## 2022-11-19 NOTE — Telephone Encounter (Signed)
Was told by [facility] he ws nocompliant with his atorvastatin. LMOM with patient but then spoke to pharmacy who states he filled it 5/23 for #30. Nothing further needed at this time.

## 2022-12-19 ENCOUNTER — Other Ambulatory Visit: Payer: Self-pay | Admitting: Internal Medicine

## 2022-12-20 NOTE — Telephone Encounter (Signed)
Prescription refill request for Xarelto received.  Indication:afib Last office visit:12/23 Weight:101.6  kg Age:72 Scr:0.83 3/24 CrCl:113.91  ml/min  Prescription refilled
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
## 2023-04-10 ENCOUNTER — Other Ambulatory Visit: Payer: Self-pay | Admitting: Pharmacist

## 2023-04-10 NOTE — Progress Notes (Signed)
Pharmacy Quality Measure Review  This patient is appearing on a report for being at risk of failing the adherence measure for cholesterol (statin) medications this calendar year.   Medication: atorvastatin 40 mg Last fill date: 9/13 for 30 day supply  Left voicemail for patient to return my call at their convenience.  Jarrett Ables, PharmD PGY-1 Pharmacy Resident

## 2023-04-11 ENCOUNTER — Other Ambulatory Visit: Payer: Medicare Other

## 2023-04-11 DIAGNOSIS — Z125 Encounter for screening for malignant neoplasm of prostate: Secondary | ICD-10-CM

## 2023-04-11 DIAGNOSIS — Z0000 Encounter for general adult medical examination without abnormal findings: Secondary | ICD-10-CM

## 2023-04-11 DIAGNOSIS — I480 Paroxysmal atrial fibrillation: Secondary | ICD-10-CM

## 2023-04-11 DIAGNOSIS — Z13220 Encounter for screening for lipoid disorders: Secondary | ICD-10-CM

## 2023-04-11 DIAGNOSIS — Z7901 Long term (current) use of anticoagulants: Secondary | ICD-10-CM

## 2023-04-11 DIAGNOSIS — I340 Nonrheumatic mitral (valve) insufficiency: Secondary | ICD-10-CM

## 2023-04-12 LAB — COMPLETE METABOLIC PANEL WITH GFR
AG Ratio: 1.9 (calc) (ref 1.0–2.5)
ALT: 8 U/L — ABNORMAL LOW (ref 9–46)
AST: 15 U/L (ref 10–35)
Albumin: 4.3 g/dL (ref 3.6–5.1)
Alkaline phosphatase (APISO): 103 U/L (ref 35–144)
BUN: 13 mg/dL (ref 7–25)
CO2: 30 mmol/L (ref 20–32)
Calcium: 9.6 mg/dL (ref 8.6–10.3)
Chloride: 102 mmol/L (ref 98–110)
Creat: 0.75 mg/dL (ref 0.70–1.28)
Globulin: 2.3 g/dL (ref 1.9–3.7)
Glucose, Bld: 96 mg/dL (ref 65–99)
Potassium: 4.2 mmol/L (ref 3.5–5.3)
Sodium: 140 mmol/L (ref 135–146)
Total Bilirubin: 1.3 mg/dL — ABNORMAL HIGH (ref 0.2–1.2)
Total Protein: 6.6 g/dL (ref 6.1–8.1)
eGFR: 95 mL/min/{1.73_m2} (ref >=60)

## 2023-04-12 LAB — CBC WITH DIFFERENTIAL/PLATELET
Absolute Lymphocytes: 1027 {cells}/uL (ref 850–3900)
Absolute Monocytes: 485 {cells}/uL (ref 200–950)
Basophils Absolute: 38 {cells}/uL (ref 0–200)
Basophils Relative: 0.6 %
Eosinophils Absolute: 554 {cells}/uL — ABNORMAL HIGH (ref 15–500)
Eosinophils Relative: 8.8 %
HCT: 38.9 % (ref 38.5–50.0)
Hemoglobin: 12.5 g/dL — ABNORMAL LOW (ref 13.2–17.1)
MCH: 26.3 pg — ABNORMAL LOW (ref 27.0–33.0)
MCHC: 32.1 g/dL (ref 32.0–36.0)
MCV: 81.7 fL (ref 80.0–100.0)
MPV: 10 fL (ref 7.5–12.5)
Monocytes Relative: 7.7 %
Neutro Abs: 4196 {cells}/uL (ref 1500–7800)
Neutrophils Relative %: 66.6 %
Platelets: 192 10*3/uL (ref 140–400)
RBC: 4.76 10*6/uL (ref 4.20–5.80)
RDW: 13.3 % (ref 11.0–15.0)
Total Lymphocyte: 16.3 %
WBC: 6.3 10*3/uL (ref 3.8–10.8)

## 2023-04-12 LAB — LIPID PANEL
Cholesterol: 143 mg/dL (ref <200)
HDL: 40 mg/dL (ref >=40)
LDL Cholesterol (Calc): 87 mg/dL
Non-HDL Cholesterol (Calc): 103 mg/dL (ref <130)
Total CHOL/HDL Ratio: 3.6 (calc) (ref <5.0)
Triglycerides: 74 mg/dL (ref <150)

## 2023-04-12 LAB — PSA: PSA: 1.43 ng/mL (ref <=4.00)

## 2023-04-15 ENCOUNTER — Ambulatory Visit: Payer: Medicare Other | Admitting: Internal Medicine

## 2023-04-15 ENCOUNTER — Encounter: Payer: Self-pay | Admitting: Internal Medicine

## 2023-04-15 VITALS — BP 130/70 | HR 70 | Ht 72.0 in | Wt 222.0 lb

## 2023-04-15 DIAGNOSIS — F419 Anxiety disorder, unspecified: Secondary | ICD-10-CM

## 2023-04-15 DIAGNOSIS — Z0000 Encounter for general adult medical examination without abnormal findings: Secondary | ICD-10-CM

## 2023-04-15 DIAGNOSIS — I2510 Atherosclerotic heart disease of native coronary artery without angina pectoris: Secondary | ICD-10-CM | POA: Diagnosis not present

## 2023-04-15 DIAGNOSIS — Z23 Encounter for immunization: Secondary | ICD-10-CM

## 2023-04-15 DIAGNOSIS — I10 Essential (primary) hypertension: Secondary | ICD-10-CM

## 2023-04-15 DIAGNOSIS — M549 Dorsalgia, unspecified: Secondary | ICD-10-CM

## 2023-04-15 DIAGNOSIS — G4733 Obstructive sleep apnea (adult) (pediatric): Secondary | ICD-10-CM | POA: Diagnosis not present

## 2023-04-15 DIAGNOSIS — Z7901 Long term (current) use of anticoagulants: Secondary | ICD-10-CM | POA: Diagnosis not present

## 2023-04-15 DIAGNOSIS — Z9889 Other specified postprocedural states: Secondary | ICD-10-CM | POA: Diagnosis not present

## 2023-04-15 DIAGNOSIS — G8929 Other chronic pain: Secondary | ICD-10-CM

## 2023-04-15 DIAGNOSIS — C099 Malignant neoplasm of tonsil, unspecified: Secondary | ICD-10-CM

## 2023-04-15 DIAGNOSIS — N400 Enlarged prostate without lower urinary tract symptoms: Secondary | ICD-10-CM

## 2023-04-15 DIAGNOSIS — Z8639 Personal history of other endocrine, nutritional and metabolic disease: Secondary | ICD-10-CM

## 2023-04-15 LAB — POCT URINALYSIS DIP (CLINITEK)
Bilirubin, UA: NEGATIVE
Blood, UA: NEGATIVE
Glucose, UA: NEGATIVE mg/dL
Ketones, POC UA: NEGATIVE mg/dL
Leukocytes, UA: NEGATIVE
Nitrite, UA: NEGATIVE
POC PROTEIN,UA: NEGATIVE
Spec Grav, UA: 1.01 (ref 1.010–1.025)
Urobilinogen, UA: 0.2 U/dL
pH, UA: 6.5 (ref 5.0–8.0)

## 2023-04-25 NOTE — Patient Instructions (Addendum)
It was a pleasure to see you today and see that you are doing well.  Please go to pharmacy for tetanus immunization update.  Flu vaccine given today.  Declines COVID booster.  Suggested shingles vaccine.  Has had pneumococcal immunization.  Continue close follow-up with Cardiology.  Continue chronic pain management.  Return in 1 year or as needed.

## 2023-05-31 ENCOUNTER — Other Ambulatory Visit: Payer: Self-pay | Admitting: *Deleted

## 2023-05-31 DIAGNOSIS — I6521 Occlusion and stenosis of right carotid artery: Secondary | ICD-10-CM

## 2023-06-06 ENCOUNTER — Ambulatory Visit (INDEPENDENT_AMBULATORY_CARE_PROVIDER_SITE_OTHER): Payer: Medicare Other | Admitting: Internal Medicine

## 2023-06-06 ENCOUNTER — Ambulatory Visit
Admission: RE | Admit: 2023-06-06 | Discharge: 2023-06-06 | Disposition: A | Discharge status: Home / Self Care | Payer: Medicare Other | Source: Ambulatory Visit | Attending: Internal Medicine | Admitting: Internal Medicine

## 2023-06-06 ENCOUNTER — Encounter: Payer: Self-pay | Admitting: Internal Medicine

## 2023-06-06 VITALS — BP 120/76 | HR 82 | Temp 98.5°F | Ht 72.0 in | Wt 222.0 lb

## 2023-06-06 DIAGNOSIS — J22 Unspecified acute lower respiratory infection: Secondary | ICD-10-CM | POA: Diagnosis not present

## 2023-06-06 DIAGNOSIS — R059 Cough, unspecified: Secondary | ICD-10-CM

## 2023-06-06 DIAGNOSIS — R0989 Other specified symptoms and signs involving the circulatory and respiratory systems: Secondary | ICD-10-CM | POA: Diagnosis not present

## 2023-06-06 DIAGNOSIS — R509 Fever, unspecified: Secondary | ICD-10-CM

## 2023-06-06 DIAGNOSIS — R6883 Chills (without fever): Secondary | ICD-10-CM

## 2023-06-06 LAB — POC COVID19 BINAXNOW: SARS Coronavirus 2 Ag: NEGATIVE

## 2023-06-06 MED ORDER — CEFTRIAXONE SODIUM 1 G IJ SOLR
1.0000 g | Freq: Once | INTRAMUSCULAR | Status: AC
  Administered 2023-06-06: 1 g via INTRAMUSCULAR

## 2023-06-06 MED ORDER — HYDROCODONE BIT-HOMATROP MBR 5-1.5 MG/5ML PO SOLN: 5.0000 mL | Freq: Three times a day (TID) | ORAL | 0 refills | Status: DC | PRN

## 2023-06-06 MED ORDER — AZITHROMYCIN 250 MG PO TABS: ORAL_TABLET | ORAL | 0 refills | Status: AC

## 2023-06-06 NOTE — Progress Notes (Shared)
Patient Care Team: [name], MD as PCP - General (Internal Medicine) Marinus Maw, MD as PCP - Cardiology (Cardiology) Dorothy Puffer, MD (Radiation Oncology) Drema Halon, MD (Inactive) (Otolaryngology) Artis Delay, MD as Consulting Physician (Hematology and Oncology) Gastroenterology, Deboraha Sprang (Gastroenterology)  Visit Date: 06/06/23  Subjective:    Patient ID: Douglas Waters , Male   DOB: 05-Apr-1951, 72 y.o.    MRN: [id number]   72 y.o. Male presents today for cough with clear sputum, shortness of breath, dizziness, fever for one week. Denies sore throat, ear pain. UTD on flu booster.  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, s/p complex MV repair using 34 mm Simuform annuloplasty ring 04/23/22   History of radiation therapy 03/02/10-04/16/10   left tonsil, has dry mouth can not taste anymore   Hypertension    PAF (paroxysmal atrial fibrillation) (HCC)    Sleep apnea    SVT (supraventricular tachycardia) (HCC)    s/p catheter ablation 02/2009     Family History  Problem Relation Age of Onset   Thyroid disease Mother    Heart disease Mother    Hypertension Mother    Cancer Father        prostate   Alzheimer's disease Father    Cancer Sister 78       breast    Social History   Social History Narrative   Not on file      Review of Systems  Constitutional:  Positive for fever. Negative for malaise/fatigue.  HENT:  Negative for congestion.   Eyes:  Negative for blurred vision.  Respiratory:  Positive for cough, sputum production (Clear) and shortness of breath.   Cardiovascular:  Negative for chest pain, palpitations and leg swelling.  Gastrointestinal:   Negative for vomiting.  Musculoskeletal:  Negative for back pain.  Skin:  Negative for rash.  Neurological:  Positive for dizziness. Negative for loss of consciousness and headaches.        Objective:   Vitals: BP 120/76   Pulse 82   Temp 98.5 F (36.9 C)   Ht 6' (1.829 m)   Wt 222 lb (100.7 kg)   SpO2 97%   BMI 30.11 kg/m    Physical Exam Vitals and nursing note reviewed.  Constitutional:      General: He is not in acute distress.    Appearance: Normal appearance. He is not ill-appearing.  HENT:     Head: Normocephalic and atraumatic.     Right Ear: Hearing, ear canal and external ear normal.     Left Ear: Hearing, ear canal and external ear normal.     Ears:     Comments: Left TM dull. Right TM full, splayed light reflex, no erythema.    Mouth/Throat:     Comments: Pharynx slightly injected. Pulmonary:     Effort: Pulmonary effort is normal.     Comments: Inspiratory rales LLL. Skin:    General: Skin is warm and dry.  Neurological:     Mental Status: He is alert and oriented to person, place, and  time. Mental status is at baseline.  Psychiatric:        Mood and Affect: Mood normal.        Behavior: Behavior normal.        Thought Content: Thought content normal.        Judgment: Judgment normal.       Results:   Studies obtained and personally reviewed by me:   Labs:       Component Value Date/Time   NA 140 04/11/2023 0926   NA 141 02/19/2022 1134   NA 140 09/19/2013 1105   K 4.2 04/11/2023 0926   K 3.9 09/19/2013 1105   CL 102 04/11/2023 0926   CL 103 09/20/2012 0953   CO2 30 04/11/2023 0926   CO2 29 09/19/2013 1105   GLUCOSE 96 04/11/2023 0926   GLUCOSE 83 09/19/2013 1105   GLUCOSE 103 (H) 09/20/2012 0953   BUN 13 04/11/2023 0926   BUN 15 02/19/2022 1134   BUN 11.2 09/19/2013 1105   CREATININE 0.75 04/11/2023 0926   CREATININE 1.0 09/19/2013 1105   CALCIUM 9.6 04/11/2023 0926   CALCIUM 9.7 09/19/2013 1105   PROT 6.6 04/11/2023 0926    PROT 7.0 09/19/2013 1105   ALBUMIN 3.9 08/20/2022 0930   ALBUMIN 3.9 09/19/2013 1105   AST 15 04/11/2023 0926   AST 19 09/19/2013 1105   ALT 8 (L) 04/11/2023 0926   ALT 16 09/19/2013 1105   ALKPHOS 88 08/20/2022 0930   ALKPHOS 86 09/19/2013 1105   BILITOT 1.3 (H) 04/11/2023 0926   BILITOT 1.58 (H) 09/19/2013 1105   GFRNONAA >60 08/24/2022 0225   GFRNONAA 85 08/17/2019 1041   GFRAA 87 05/20/2020 0938   GFRAA 98 08/17/2019 1041     Lab Results  Component Value Date   WBC 6.3 04/11/2023   HGB 12.5 (L) 04/11/2023   HCT 38.9 04/11/2023   MCV 81.7 04/11/2023   PLT 192 04/11/2023    Lab Results  Component Value Date   CHOL 143 04/11/2023   HDL 40 04/11/2023   LDLCALC 87 04/11/2023   TRIG 74 04/11/2023   CHOLHDL 3.6 04/11/2023    Lab Results  Component Value Date   HGBA1C 5.2 04/21/2022     Lab Results  Component Value Date   TSH 1.95 02/17/2018     Lab Results  Component Value Date   PSA 1.43 04/11/2023   PSA 1.44 03/22/2022   PSA 1.50 03/16/2021      Assessment & Plan:   Acute lower respiratory infection / right serous otitis media: Covid test negative today. Administered Rocephin 1 g IM. Prescribed Z-Pak two tabs day 1 followed by one tab days 2-5, Hycodan syrup 1 tsp every 8 hours as needed for cough. Ordered CXR. Will contact with results. Get plenty of rest and stay well-hydrated. Contact us if symptoms worsen or fail to improve.     I,Alexander Ruley,acting as a Neurosurgeon for [name], MD.,have documented all relevant documentation on the behalf of [name], MD,as directed by  [name], MD while in the presence of [name], MD.   ***
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
## 2023-06-14 ENCOUNTER — Ambulatory Visit (HOSPITAL_COMMUNITY)
Admission: RE | Admit: 2023-06-14 | Discharge: 2023-06-14 | Disposition: A | Discharge status: Home / Self Care | Payer: Medicare Other | Source: Ambulatory Visit | Attending: Vascular Surgery | Admitting: Vascular Surgery

## 2023-06-14 ENCOUNTER — Ambulatory Visit (INDEPENDENT_AMBULATORY_CARE_PROVIDER_SITE_OTHER): Payer: Medicare Other | Admitting: Vascular Surgery

## 2023-06-14 ENCOUNTER — Encounter: Payer: Self-pay | Admitting: Vascular Surgery

## 2023-06-14 VITALS — BP 134/71 | HR 50 | Temp 97.8°F | Resp 18 | Ht 72.0 in | Wt 232.0 lb

## 2023-06-14 DIAGNOSIS — I6521 Occlusion and stenosis of right carotid artery: Secondary | ICD-10-CM | POA: Diagnosis not present

## 2023-06-14 NOTE — Patient Instructions (Addendum)
You have been diagnosed with an acute lower respiratory infection and right serous otitis media.  COVID test is negative.  We gave you 1 g IM Rocephin today and have prescribed Zithromax Z-PAK to take 2 tabs day 1 followed by 1 tab days 2 through 5.  Chest x-ray has been ordered.  Please take Hycodan syrup sparingly 1 teaspoon every 8 hours as needed for cough.  Rest and drink plenty of fluids.  Addendum: Chest x-ray is negative for pneumonia

## 2023-06-21 ENCOUNTER — Other Ambulatory Visit: Payer: Self-pay | Admitting: Internal Medicine

## 2023-06-21 NOTE — Telephone Encounter (Signed)
 Prescription refill request for Xarelto received.  Indication:afib Last office visit:needs appt Weight:105.2  kg Age:72 Scr:0.75  10/24 CrCl:130.53  ml/min  Prescription refilled

## 2023-06-24 ENCOUNTER — Encounter: Payer: Self-pay | Admitting: Student

## 2023-06-24 ENCOUNTER — Ambulatory Visit: Payer: Medicare Other | Attending: Student | Admitting: Student

## 2023-06-24 VITALS — BP 142/72 | HR 62 | Ht 72.0 in | Wt 235.6 lb

## 2023-06-24 DIAGNOSIS — I341 Nonrheumatic mitral (valve) prolapse: Secondary | ICD-10-CM | POA: Diagnosis not present

## 2023-06-24 DIAGNOSIS — R0602 Shortness of breath: Secondary | ICD-10-CM | POA: Diagnosis not present

## 2023-06-24 DIAGNOSIS — Z9889 Other specified postprocedural states: Secondary | ICD-10-CM

## 2023-06-24 DIAGNOSIS — I10 Essential (primary) hypertension: Secondary | ICD-10-CM

## 2023-06-24 NOTE — Patient Instructions (Signed)
Medication Instructions:  Your physician recommends that you continue on your current medications as directed. Please refer to the Current Medication list given to you today.  *If you need a refill on your cardiac medications before your next appointment, please call your pharmacy*  Lab Work: None ordered If you have labs (blood work) drawn today and your tests are completely normal, you will receive your results only by: MyChart Message (if you have MyChart) OR A paper copy in the mail If you have any lab test that is abnormal or we need to change your treatment, we will call you to review the results.  Follow-Up: At [facility], you and your health needs are our priority.  As part of our continuing mission to provide you with exceptional heart care, we have created designated Provider Care Teams.  These Care Teams include your primary Cardiologist (physician) and Advanced Practice Providers (APPs -  Physician Assistants and Nurse Practitioners) who all work together to provide you with the care you need, when you need it.  Your next appointment:   6 month(s)  Provider:   Lewayne Bunting, MD

## 2023-06-24 NOTE — Progress Notes (Signed)
  Electrophysiology Office Note:   Date:  06/24/2023  ID:  Douglas Waters, Douglas Waters 08-28-1950, MRN [id number]  Primary Cardiologist: Danelle Birmingham, MD Electrophysiologist: None      History of Present Illness:   Douglas Waters is a 73 y.o. male with h/o SVT s/p ablation, persistent fib, MR s/p MV repair and MAZE seen today for routine electrophysiology followup.   Since last being seen in our clinic the patient reports doing well. Overall, he denies chest pain, palpitations, dyspnea, PND, orthopnea, nausea, vomiting, dizziness, syncope, edema, weight gain, or early satiety.   Review of systems complete and found to be negative unless listed in HPI.   EP Information / Studies Reviewed:    EKG is ordered today. Personal review as below.  EKG Interpretation Date/Time:  Friday June 24 2023 11:37:22 EST Ventricular Rate:  62 PR Interval:    QRS Duration:  92 QT Interval:  436 QTC Calculation: 442 R Axis:   22  Text Interpretation: Narrow junctional rhythm Cannot rule out atrial fibrillation with no clear atrial signal. Confirmed by Lesia Sharper [phone]) on 06/24/2023 12:53:32 PM    Echo 05/2022 LVEF 50-55%  Physical Exam:   VS:  BP (!) 142/72   Pulse 62   Ht 6' (1.829 m)   Wt 235 lb 9.6 oz (106.9 kg)   SpO2 97%   BMI 31.95 kg/m    Wt Readings from Last 3 Encounters:  06/24/23 235 lb 9.6 oz (106.9 kg)  06/14/23 232 lb (105.2 kg)  06/06/23 222 lb (100.7 kg)     GEN: No acute distress NECK: No JVD; No carotid bruits CARDIAC: Regular rate and rhythm, no murmurs, rubs, gallops RESPIRATORY:  Clear to auscultation without rales, wheezing or rhonchi  ABDOMEN: Soft, non-tender, non-distended EXTREMITIES:  No edema; No deformity   ASSESSMENT AND PLAN:    Paroxysmal AF Junctional rhythm  EKG today shows juntional rhythm with narrow QRS vs less likely AF Continue Xarelto  20 mg daily  CHA2DS2/VASc is at least 3 S/p MAZE at time of MVR   Transient pauses On prior  monitoring Have discussed symptoms and warning signs of heart block No current indication for PM but perhaps in the future.   HTN Stable on current regimen   MR s/p MVR Stable by echo 05/2022     Follow up with Dr. Birmingham in 6 months  Signed, Sharper Prentice Lesia, PA-C

## 2023-06-28 ENCOUNTER — Other Ambulatory Visit: Payer: Self-pay

## 2023-06-28 DIAGNOSIS — I6521 Occlusion and stenosis of right carotid artery: Secondary | ICD-10-CM

## 2023-08-05 ENCOUNTER — Other Ambulatory Visit: Payer: Self-pay | Admitting: Internal Medicine

## 2023-08-05 DIAGNOSIS — I4891 Unspecified atrial fibrillation: Secondary | ICD-10-CM

## 2023-08-05 NOTE — Telephone Encounter (Signed)
Prescription refill request for Xarelto received.  Indication: Afib  Last office visit: 06/24/23 Lanna Poche)  Weight: 106.9kg Age: 73 Scr: 0.75 (04/11/23)  CrCl: 132.48ml/min  Appropriate dose. Refill sent.

## 2023-09-30 ENCOUNTER — Telehealth: Payer: Self-pay | Admitting: Internal Medicine

## 2023-09-30 NOTE — Telephone Encounter (Signed)
 Patient reports over the past couple of months he has had symptoms similar to what he experienced before his valve repair sx (MVR with [facility] in 2023), symptoms have been progressively getting worse.  He reports SOB with activity, feeling like he is about to pass out when walking at a game, has no strength, no energy.  He would like to know if he needs to be seen by Dr. Carolynne Citron or APP. Will forward to Dr. Meridith Stanford nurse to follow-up with patient.  Patient states he has some things to do this afternoon. OK to schedule appt if recommended and leave him a detailed message on his voicemail at [phone].

## 2023-09-30 NOTE — Telephone Encounter (Signed)
  Patient said he is experiencing the same symptoms he had prior to his heart valve replacement. He is concerned and wants to know if he needs to see Dr. Carolynne Citron to get checked.

## 2023-10-24 ENCOUNTER — Other Ambulatory Visit: Payer: Self-pay | Admitting: Internal Medicine

## 2023-10-24 DIAGNOSIS — I10 Essential (primary) hypertension: Secondary | ICD-10-CM

## 2024-01-02 ENCOUNTER — Telehealth: Payer: Self-pay | Admitting: Internal Medicine

## 2024-01-02 NOTE — Telephone Encounter (Signed)
 Pt called to report that he has been having heaviness in his chest with minimal exertion.. he feels pressure in his chest and head when he does heavy lifting... he has been very tired.. his wife is concerned that he is not tolerating his activity levels as he had been...he has only been seen by EP so I made him an appt with Dr Mona the DOD 01/03/24.

## 2024-01-02 NOTE — Telephone Encounter (Signed)
 Pt c/o Shortness Of Breath: STAT if SOB developed within the last 24 hours or pt is noticeably SOB on the phone  1. Are you currently SOB (can you hear that pt is SOB on the phone)? No   2. How long have you been experiencing SOB? Month   3. Are you SOB when sitting or when up moving around? Moving around   4. Are you currently experiencing any other symptoms? Pressure in head if he picks up more than 20lbs

## 2024-01-03 ENCOUNTER — Ambulatory Visit: Attending: Internal Medicine | Admitting: Internal Medicine

## 2024-01-03 VITALS — BP 138/68 | HR 58 | Ht 72.0 in | Wt 233.6 lb

## 2024-01-03 DIAGNOSIS — R079 Chest pain, unspecified: Secondary | ICD-10-CM

## 2024-01-03 DIAGNOSIS — I4821 Permanent atrial fibrillation: Secondary | ICD-10-CM | POA: Diagnosis not present

## 2024-01-03 DIAGNOSIS — Z9889 Other specified postprocedural states: Secondary | ICD-10-CM | POA: Diagnosis not present

## 2024-01-03 DIAGNOSIS — I340 Nonrheumatic mitral (valve) insufficiency: Secondary | ICD-10-CM | POA: Diagnosis not present
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
## 2024-01-03 NOTE — Patient Instructions (Addendum)
 Medication Instructions:  Your physician recommends that you continue on your current medications as directed. Please refer to the Current Medication list given to you today.  *If you need a refill on your cardiac medications before your next appointment, please call your pharmacy*  Testing/Procedures:    Your cardiac CT will be scheduled at one of the below locations:   [facility] [address] [place], [place] [address] [phone]  OR  [facility] [address] [place], [place] [address] [phone]  OR   [facility] [address] [place], [place] [address] [phone]  If scheduled at [facility], please arrive at the [facility] and Children's Entrance (Entrance C2) of [facility] 30 minutes prior to test start time. You can use the FREE valet parking offered at entrance C (encouraged to control the heart rate for the test)  Proceed to the [facility] Radiology Department (first floor) to check-in and test prep.  All radiology patients and guests should use entrance C2 at [facility], accessed from [facility], even though the hospital's physical address listed is [address]    If scheduled at [facility] or [facility], please arrive 15 mins early for check-in and test prep.  There is spacious parking and easy access to the radiology department from the [facility] Heart and Vascular entrance. Please enter here and check-in with the desk attendant.   Please follow these instructions carefully (unless otherwise directed):  An IV will be required for this test and Nitroglycerin  will be given.  Hold all erectile dysfunction medications at least 3 days (72 hrs) prior to test. (Ie viagra, cialis, sildenafil, tadalafil, etc)   On the Night Before the Test: Be sure to Drink plenty of water . Do not  consume any caffeinated/decaffeinated beverages or chocolate 12 hours prior to your test. Do not take any antihistamines 12 hours prior to your test. If the patient has contrast allergy: Patient will need a prescription for Prednisone and very clear instructions (as follows): Prednisone 50 mg - take 13 hours prior to test Take another Prednisone 50 mg 7 hours prior to test Take another Prednisone 50 mg 1 hour prior to test Take Benadryl 50 mg 1 hour prior to test Patient must complete all four doses of above prophylactic medications. Patient will need a ride after test due to Benadryl.  On the Day of the Test: Drink plenty of water  until 1 hour prior to the test. Do not eat any food 1 hour prior to test. You may take your regular medications prior to the test.  Take metoprolol  (Lopressor ) two hours prior to test. If you take Furosemide /Hydrochlorothiazide/Spironolactone/Chlorthalidone, please HOLD on the morning of the test. Patients who wear a continuous glucose monitor MUST remove the device prior to scanning. FEMALES- please wear underwire-free bra if available, avoid dresses & tight clothing  *For Clinical Staff only. Please instruct patient the following:* Heart Rate Medication Recommendations for Cardiac CT  Resting HR < 50 bpm  No medication  Resting HR 50-60 bpm and BP >110/50 mmHG   Consider Metoprolol  tartrate 25 mg PO 90-120 min prior to scan  Resting HR 60-65 bpm and BP >110/50 mmHG  Metoprolol  tartrate 50 mg PO 90-120 minutes prior to scan   Resting HR > 65 bpm and BP >110/50 mmHG  Metoprolol  tartrate 100 mg PO 90-120 minutes prior to scan  Consider Ivabradine 10-15  mg PO or a calcium  channel blocker for resting HR >60 bpm and contraindication to metoprolol  tartrate  Consider Ivabradine 10-15 mg PO in combination with metoprolol  tartrate for HR >80 bpm        After the Test: Drink plenty of water . After receiving IV contrast, you may experience a mild flushed feeling.  This is normal. On occasion, you may experience a mild rash up to 24 hours after the test. This is not dangerous. If this occurs, you can take Benadryl 25 mg and increase your fluid intake. If you experience trouble breathing, this can be serious. If it is severe call 911 IMMEDIATELY. If it is mild, please call our office.  We will call to schedule your test 2-4 weeks out understanding that some insurance companies will need an authorization prior to the service being performed.   For more information and frequently asked questions, please visit our website : [email or web address]  For non-scheduling related questions, please contact the cardiac imaging nurse navigator should you have any questions/concerns: Cardiac Imaging Nurse Navigators Direct Office Dial: [phone]   For scheduling needs, including cancellations and rescheduling, please call [place], [phone].   Your physician has requested that you have an echocardiogram. Echocardiography is a painless test that uses sound waves to create images of your heart. It provides your doctor with information about the size and shape of your heart and how well your heart's chambers and valves are working. This procedure takes approximately one hour. There are no restrictions for this procedure. Please do NOT wear cologne, perfume, aftershave, or lotions (deodorant is allowed). Please arrive 15 minutes prior to your appointment time.   Follow-Up: At [facility], you and your health needs are our priority.  As part of our continuing mission to provide you with exceptional heart care, our providers are all part of one team.  This team includes your primary Cardiologist (physician) and Advanced Practice Providers or APPs (Physician Assistants and Nurse Practitioners) who all work together to provide you with the care you need, when you need it.  Your next appointment:   After cardiac CT and echocardiogram
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
## 2024-01-10 ENCOUNTER — Ambulatory Visit (HOSPITAL_COMMUNITY)

## 2024-01-10 ENCOUNTER — Telehealth (HOSPITAL_COMMUNITY): Payer: Self-pay | Admitting: *Deleted

## 2024-01-10 NOTE — Telephone Encounter (Signed)
 Attempted to call patient regarding upcoming cardiac CT appointment. Left message on voicemail with name and callback number Johney Frame RN Navigator Cardiac Imaging [facility] [phone] Office

## 2024-01-10 NOTE — Telephone Encounter (Signed)
Patient returning call about his upcoming cardiac imaging study; pt verbalizes understanding of appt date/time, parking situation and where to check in, pre-test NPO status and verified current allergies; name and call back number provided for further questions should they arise  Gordy Clement RN Navigator Cardiac Imaging [place] and Vascular [phone] office [phone] cell

## 2024-01-11 ENCOUNTER — Ambulatory Visit (HOSPITAL_COMMUNITY)
Admission: RE | Admit: 2024-01-11 | Discharge: 2024-01-11 | Disposition: A | Discharge status: Home / Self Care | Source: Ambulatory Visit | Attending: Cardiovascular Disease | Admitting: Cardiovascular Disease

## 2024-01-11 ENCOUNTER — Ambulatory Visit (HOSPITAL_BASED_OUTPATIENT_CLINIC_OR_DEPARTMENT_OTHER)
Admission: RE | Admit: 2024-01-11 | Discharge: 2024-01-11 | Disposition: A | Discharge status: Home / Self Care | Source: Ambulatory Visit | Attending: Cardiovascular Disease | Admitting: Cardiovascular Disease

## 2024-01-11 ENCOUNTER — Other Ambulatory Visit: Payer: Self-pay | Admitting: Cardiology

## 2024-01-11 DIAGNOSIS — I700 Atherosclerosis of aorta: Secondary | ICD-10-CM | POA: Diagnosis not present

## 2024-01-11 DIAGNOSIS — I2510 Atherosclerotic heart disease of native coronary artery without angina pectoris: Secondary | ICD-10-CM

## 2024-01-11 DIAGNOSIS — R931 Abnormal findings on diagnostic imaging of heart and coronary circulation: Secondary | ICD-10-CM

## 2024-01-11 DIAGNOSIS — I517 Cardiomegaly: Secondary | ICD-10-CM | POA: Insufficient documentation

## 2024-01-11 DIAGNOSIS — R079 Chest pain, unspecified: Secondary | ICD-10-CM | POA: Insufficient documentation

## 2024-01-11 DIAGNOSIS — I4821 Permanent atrial fibrillation: Secondary | ICD-10-CM | POA: Diagnosis not present

## 2024-01-11 MED ORDER — NITROGLYCERIN 0.4 MG SL SUBL
0.8000 mg | SUBLINGUAL_TABLET | Freq: Once | SUBLINGUAL | Status: AC
  Administered 2024-01-11: 0.8 mg via SUBLINGUAL

## 2024-01-11 MED ORDER — IOHEXOL 350 MG/ML SOLN
115.0000 mL | Freq: Once | INTRAVENOUS | Status: AC | PRN
  Administered 2024-01-11: 115 mL via INTRAVENOUS

## 2024-01-12 ENCOUNTER — Ambulatory Visit (HOSPITAL_BASED_OUTPATIENT_CLINIC_OR_DEPARTMENT_OTHER): Payer: Self-pay | Admitting: Internal Medicine

## 2024-01-17 NOTE — Telephone Encounter (Signed)
 Pt spouse calling in for results

## 2024-01-19 ENCOUNTER — Ambulatory Visit: Attending: Cardiology | Admitting: Cardiology

## 2024-01-19 ENCOUNTER — Encounter: Payer: Self-pay | Admitting: Cardiology

## 2024-01-19 VITALS — BP 132/82 | HR 74 | Ht 72.0 in | Wt 220.0 lb

## 2024-01-19 DIAGNOSIS — R079 Chest pain, unspecified: Secondary | ICD-10-CM | POA: Diagnosis not present

## 2024-01-19 DIAGNOSIS — I4821 Permanent atrial fibrillation: Secondary | ICD-10-CM | POA: Diagnosis not present

## 2024-01-19 DIAGNOSIS — Z9889 Other specified postprocedural states: Secondary | ICD-10-CM

## 2024-01-19 DIAGNOSIS — Z01812 Encounter for preprocedural laboratory examination: Secondary | ICD-10-CM | POA: Diagnosis not present

## 2024-01-19 NOTE — H&P (View-Only) (Signed)
 Cardiology Office Note:  .   Date:  01/19/2024  ID:  Douglas Waters, DOB 1951-05-26, MRN [id number] PCP: Perri Ronal PARAS, MD  [facility] Providers Cardiologist:  Danelle Birmingham, MD     History of Present Illness: .   Douglas Waters is a 73 y.o. male Discussed the use of AI scribe software for clinical note transcription with the patient, who gave verbal consent to proceed.  History of Present Illness Douglas Waters is a 73 year old male with paroxysmal atrial fibrillation and mitral valve prolapse who presents for follow-up of chest pain.  He experiences a pressure sensation in his chest when lifting heavy objects, such as a fifty-pound bag of feed. This sensation is described as a pressure in his head, particularly when lifting, and is associated with a need for the classroom. No current chest pressure is noted, but there is a history of pressure behind his eyes and temples, leading to headaches and dizziness. He also reports quick fatigue.  He has a history of paroxysmal atrial fibrillation and supraventricular tachycardia (SVT) and is under the care of Dr. Birmingham for these conditions. No recent episodes of tachycardia are reported. He also has a history of transient pauses in junctional rhythm, described as possible vagal type reactions when lifting heavy objects.  In November 2023, he underwent mitral valve repair with a 34 mm annuloplasty ring due to severe mitral regurgitation. At that time, his ejection fraction was 50-55%.  He underwent an echocardiogram and coronary CT, which showed very minimal coronary disease by catheterization in 2023. However, a recent coronary calcium  score was 14, placing him in the 85th percentile, with extensive total plaque volume and moderate stenosis in the LAD, diagonal, and RV branch. The FFR showed values between 0.78 to 0.71.  His current medications include Xarelto .      ROS: No bleeding  Studies Reviewed: .         Results RADIOLOGY Coronary CT: Coronary calcium  score 14, 85th percentile; total plaque volume extensive; moderate stenosis in LAD, diagonal and RV branch  DIAGNOSTIC Echocardiogram: Ejection Fraction (EF) 50-55% Fractional Flow Reserve (FFR): 0.78 to 0.71, possible flow-limiting disease in LAD Electrocardiogram (EKG): Atrial fibrillation 58 bpm with occasional premature ventricular contractions (PVCs) (01/03/2024) Risk Assessment/Calculations:            Physical Exam:   VS:  BP 132/82   Pulse 74   Ht 6' (1.829 m)   Wt 220 lb (99.8 kg)   SpO2 96%   BMI 29.84 kg/m    Wt Readings from Last 3 Encounters:  01/19/24 220 lb (99.8 kg)  01/03/24 233 lb 9.6 oz (106 kg)  06/24/23 235 lb 9.6 oz (106.9 kg)    GEN: Well nourished, well developed in no acute distress NECK: No JVD; No carotid bruits CARDIAC: Irreg, soft systolic murmur, no rubs, no gallops RESPIRATORY:  Clear to auscultation without rales, wheezing or rhonchi  ABDOMEN: Soft, non-tender, non-distended EXTREMITIES:  No edema; No deformity   ASSESSMENT AND PLAN: .    Assessment and Plan Assessment & Plan Coronary artery disease with possible flow-limiting LAD stenosis Concern for possible flow-limiting stenosis in the left anterior descending (LAD) artery, as suggested by the recent coronary CT scan. This may have progressed since the mitral valve repair in 2023, where minimal coronary disease was noted. The CT scan indicates potential tightness in the LAD, which could be contributing to symptoms experienced during exertion, such as pressure when lifting heavy objects. The  CT scan's accuracy needs confirmation through a heart catheterization to assess the severity of the stenosis and determine if intervention is necessary. Risks of the catheterization procedure include stroke, heart attack, or death, with a risk of less than 1 in 1000. - Schedule cardiac catheterization to assess LAD stenosis - Instruct to hold Xarelto  for  two days prior to the procedure - Advise against taking baby aspirin  while on Xarelto  - Counsel to avoid strenuous activities until the procedure  History of mitral valve repair for severe mitral regurgitation and mitral valve prolapse Mitral valve repair was performed in November 2023 with a 34 mm annuloplasty ring due to severe mitral regurgitation and mitral valve prolapse. The valve appears to be functioning well currently, with no new issues reported.  Paroxysmal atrial fibrillation Paroxysmal atrial fibrillation. No recent episodes of tachycardia reported. EKG from January 03, 2024, showed atrial fibrillation with a rate of 58 beats per minute and occasional PVCs.  Exertional headache and dizziness Experiences headaches and dizziness, particularly when lifting heavy objects. These symptoms are atypical for coronary artery disease and may not be directly related to the suspected LAD stenosis. The possibility of transient blood pressure elevation during exertion is considered. Further evaluation by a neurologist may be warranted if symptoms persist after addressing the coronary issue. - Consider referral to a neurologist if symptoms persist after cardiac evaluation       Informed Consent   Shared Decision Making/Informed Consent The risks [stroke (1 in 1000), death (1 in 1000), kidney failure [usually temporary] (1 in 500), bleeding (1 in 200), allergic reaction [possibly serious] (1 in 200)], benefits (diagnostic support and management of coronary artery disease) and alternatives of a cardiac catheterization were discussed in detail with Douglas Waters and he is willing to proceed.     Dispo: post cath  Signed, Oneil Parchment, MD

## 2024-01-19 NOTE — Progress Notes (Signed)
 Cardiology Office Note:  .   Date:  01/19/2024  ID:  Douglas Waters, DOB Mar 23, 1951, MRN [id number] PCP: Perri Ronal PARAS, MD  [facility] Providers Cardiologist:  Danelle Birmingham, MD     History of Present Illness: .   Douglas Waters is a 73 y.o. male Discussed the use of AI scribe software for clinical note transcription with the patient, who gave verbal consent to proceed.  History of Present Illness Douglas Waters is a 73 year old male with paroxysmal atrial fibrillation and mitral valve prolapse who presents for follow-up of chest pain.  He experiences a pressure sensation in his chest when lifting heavy objects, such as a fifty-pound bag of feed. This sensation is described as a pressure in his head, particularly when lifting, and is associated with a need for the classroom. No current chest pressure is noted, but there is a history of pressure behind his eyes and temples, leading to headaches and dizziness. He also reports quick fatigue.  He has a history of paroxysmal atrial fibrillation and supraventricular tachycardia (SVT) and is under the care of Dr. Birmingham for these conditions. No recent episodes of tachycardia are reported. He also has a history of transient pauses in junctional rhythm, described as possible vagal type reactions when lifting heavy objects.  In November 2023, he underwent mitral valve repair with a 34 mm annuloplasty ring due to severe mitral regurgitation. At that time, his ejection fraction was 50-55%.  He underwent an echocardiogram and coronary CT, which showed very minimal coronary disease by catheterization in 2023. However, a recent coronary calcium  score was 14, placing him in the 85th percentile, with extensive total plaque volume and moderate stenosis in the LAD, diagonal, and RV branch. The FFR showed values between 0.78 to 0.71.  His current medications include Xarelto .      ROS: No bleeding  Studies Reviewed: .         Results RADIOLOGY Coronary CT: Coronary calcium  score 14, 85th percentile; total plaque volume extensive; moderate stenosis in LAD, diagonal and RV branch  DIAGNOSTIC Echocardiogram: Ejection Fraction (EF) 50-55% Fractional Flow Reserve (FFR): 0.78 to 0.71, possible flow-limiting disease in LAD Electrocardiogram (EKG): Atrial fibrillation 58 bpm with occasional premature ventricular contractions (PVCs) (01/03/2024) Risk Assessment/Calculations:            Physical Exam:   VS:  BP 132/82   Pulse 74   Ht 6' (1.829 m)   Wt 220 lb (99.8 kg)   SpO2 96%   BMI 29.84 kg/m    Wt Readings from Last 3 Encounters:  01/19/24 220 lb (99.8 kg)  01/03/24 233 lb 9.6 oz (106 kg)  06/24/23 235 lb 9.6 oz (106.9 kg)    GEN: Well nourished, well developed in no acute distress NECK: No JVD; No carotid bruits CARDIAC: Irreg, soft systolic murmur, no rubs, no gallops RESPIRATORY:  Clear to auscultation without rales, wheezing or rhonchi  ABDOMEN: Soft, non-tender, non-distended EXTREMITIES:  No edema; No deformity   ASSESSMENT AND PLAN: .    Assessment and Plan Assessment & Plan Coronary artery disease with possible flow-limiting LAD stenosis Concern for possible flow-limiting stenosis in the left anterior descending (LAD) artery, as suggested by the recent coronary CT scan. This may have progressed since the mitral valve repair in 2023, where minimal coronary disease was noted. The CT scan indicates potential tightness in the LAD, which could be contributing to symptoms experienced during exertion, such as pressure when lifting heavy objects. The  CT scan's accuracy needs confirmation through a heart catheterization to assess the severity of the stenosis and determine if intervention is necessary. Risks of the catheterization procedure include stroke, heart attack, or death, with a risk of less than 1 in 1000. - Schedule cardiac catheterization to assess LAD stenosis - Instruct to hold Xarelto  for  two days prior to the procedure - Advise against taking baby aspirin  while on Xarelto  - Counsel to avoid strenuous activities until the procedure  History of mitral valve repair for severe mitral regurgitation and mitral valve prolapse Mitral valve repair was performed in November 2023 with a 34 mm annuloplasty ring due to severe mitral regurgitation and mitral valve prolapse. The valve appears to be functioning well currently, with no new issues reported.  Paroxysmal atrial fibrillation Paroxysmal atrial fibrillation. No recent episodes of tachycardia reported. EKG from January 03, 2024, showed atrial fibrillation with a rate of 58 beats per minute and occasional PVCs.  Exertional headache and dizziness Experiences headaches and dizziness, particularly when lifting heavy objects. These symptoms are atypical for coronary artery disease and may not be directly related to the suspected LAD stenosis. The possibility of transient blood pressure elevation during exertion is considered. Further evaluation by a neurologist may be warranted if symptoms persist after addressing the coronary issue. - Consider referral to a neurologist if symptoms persist after cardiac evaluation       Informed Consent   Shared Decision Making/Informed Consent The risks [stroke (1 in 1000), death (1 in 1000), kidney failure [usually temporary] (1 in 500), bleeding (1 in 200), allergic reaction [possibly serious] (1 in 200)], benefits (diagnostic support and management of coronary artery disease) and alternatives of a cardiac catheterization were discussed in detail with Mr. Boulet and he is willing to proceed.     Dispo: post cath  Signed, Oneil Parchment, MD

## 2024-01-19 NOTE — Addendum Note (Signed)
 Addended by: GRETEL MAEOLA CROME on: 01/19/2024 02:10 PM   Modules accepted: Orders

## 2024-01-19 NOTE — Patient Instructions (Signed)
 Medication Instructions:  The current medical regimen is effective;  continue present plan and medications.  *If you need a refill on your cardiac medications before your next appointment, please call your pharmacy*  Lab Work: Will need blood work prior to your heart cath (CBC, BMP)  If you have labs (blood work) drawn today and your tests are completely normal, you will receive your results only by: MyChart Message (if you have MyChart) OR A paper copy in the mail If you have any lab test that is abnormal or we need to change your treatment, we will call you to review the results.  Testing/Procedures: Left heart cath  Follow-Up: At [facility], you and your health needs are our priority.  As part of our continuing mission to provide you with exceptional heart care, our providers are all part of one team.  This team includes your primary Cardiologist (physician) and Advanced Practice Providers or APPs (Physician Assistants and Nurse Practitioners) who all work together to provide you with the care you need, when you need it.  Your next appointment:   Follow as instructed with  Provider:   Danelle Birmingham, MD    We recommend signing up for the patient portal called MyChart.  Sign up information is provided on this After Visit Summary.  MyChart is used to connect with patients for Virtual Visits (Telemedicine).  Patients are able to view lab/test results, encounter notes, upcoming appointments, etc.  Non-urgent messages can be sent to your provider as well.   To learn more about what you can do with MyChart, go to [email or web address].    Other Instructions       Cardiac/Peripheral Catheterization   You are scheduled for a Cardiac Catheterization on Wednesday, August 13 with Dr. Peter Sw[place]and.  1. Please arrive at the [facility] (Main Entrance A) at [facility]: [address] [place], [place] [address] at 8:00 AM (This time is 2 hour(s) before your procedure  to ensure your preparation).   Free valet parking service is available. You will check in at ADMITTING. The support person will be asked to wait in the waiting room.  It is OK to have someone drop you off and come back when you are ready to be discharged.        Special note: Every effort is made to have your procedure done on time. Please understand that emergencies sometimes delay scheduled procedures.  2. Diet: No solid foods after midnight. You may have clear liquids until you arrive at the hospital.  List of approved liquids water , clear juice, clear tea, black coffee, fruit juices, non-citric and without pulp, carbonated beverages, Gatorade, Kool -Aid, plain Jello-O and plain ice popsicles.  3. Hydration: You need to be well hydrated before your procedure time. You may drink approved liquids (see below) until you arrive at the hospital. On the way to the hospital, please drink a 16-oz (1 plastic bottle) of water .   List of approved liquids water , clear juice, clear tea, black coffee, fruit juices, non-citric and without pulp, carbonated beverages, Gatorade, Kool -Aid, plain Jello-O and plain ice popsicles.  4. Labs: You will need to have blood drawn today (8/7).   5. Medication instructions in preparation for your procedure:  Stop taking Xarelto  (Rivaroxaban ) on Monday, August 11. (Last dose on 01/22/24)    On the morning of your procedure, take Aspirin  81 mg and any morning medicines NOT listed above.  You may use sips of water .  6. Plan  to go home the same day, you will only stay overnight if medically necessary. 7. You MUST have a responsible adult to drive you home. 8. An adult MUST be with you the first 24 hours after you arrive home. 9. Bring a current list of your medications, and the last time and date medication taken. 10. Bring ID and current insurance cards. 11.Please wear clothes that are easy to get on and off and wear slip-on shoes.  Thank you for allowing us  to  care for you!   -- [facility]

## 2024-01-20 ENCOUNTER — Ambulatory Visit: Payer: Self-pay

## 2024-01-20 LAB — BASIC METABOLIC PANEL WITH GFR
BUN/Creatinine Ratio: 18 (ref 10–24)
BUN: 17 mg/dL (ref 8–27)
CO2: 23 mmol/L (ref 20–29)
Calcium: 10.4 mg/dL — AB (ref 8.6–10.2)
Chloride: 98 mmol/L (ref 96–106)
Creatinine, Ser: 0.96 mg/dL (ref 0.76–1.27)
Glucose: 95 mg/dL (ref 70–99)
Potassium: 5 mmol/L (ref 3.5–5.2)
Sodium: 140 mmol/L (ref 134–144)
eGFR: 83 mL/min/1.73 (ref >59)

## 2024-01-20 LAB — CBC
Hematocrit: 45.7 % (ref 37.5–51.0)
Hemoglobin: 14.7 g/dL (ref 13.0–17.7)
MCH: 26.4 pg — ABNORMAL LOW (ref 26.6–33.0)
MCHC: 32.2 g/dL (ref 31.5–35.7)
MCV: 82 fL (ref 79–97)
Platelets: 231 x10E3/uL (ref 150–450)
RBC: 5.56 x10E6/uL (ref 4.14–5.80)
RDW: 13.4 % (ref 11.6–15.4)
WBC: 7.5 x10E3/uL (ref 3.4–10.8)

## 2024-01-23 ENCOUNTER — Telehealth: Payer: Self-pay | Admitting: *Deleted

## 2024-01-23 NOTE — Telephone Encounter (Signed)
 Cardiac Catheterization scheduled at [facility] for: Wednesday January 25, 2024 10 AM Arrival time [facility] Main Entrance A at: 8 AM  Diet: -Nothing to eat after midnight prior to procedure.  Hydration: -May drink clear liquids until leaving for hospital. Approved liquids: Water , clear tea, black coffee, fruit juices-non-citric and without pulp,Gatorade, plain Jello/popsicles.  Drink 16 oz. bottle of water  on the way to the hospital.  Medication instructions: -Hold:  Xarelto -pt tells me last dose 01/22/24 and knows to hold until post procedure -Other usual morning medications can be taken including aspirin  81 mg.  Plan to go home the same day, you will only stay overnight if medically necessary.  You must have responsible adult to drive you home.  Someone must be with you the first 24 hours after you arrive home.  Reviewed procedure instructions with patient.

## 2024-01-25 ENCOUNTER — Ambulatory Visit (HOSPITAL_COMMUNITY)
Admission: RE | Admit: 2024-01-25 | Discharge: 2024-01-25 | Disposition: A | Discharge status: Home / Self Care | Attending: Cardiology | Admitting: Cardiology

## 2024-01-25 ENCOUNTER — Encounter (HOSPITAL_COMMUNITY): Payer: Self-pay | Admitting: Cardiology

## 2024-01-25 ENCOUNTER — Encounter (HOSPITAL_COMMUNITY)
Admission: RE | Disposition: A | Discharge status: Home / Self Care | Payer: Self-pay | Source: Home / Self Care | Attending: Cardiology

## 2024-01-25 ENCOUNTER — Other Ambulatory Visit: Payer: Self-pay

## 2024-01-25 DIAGNOSIS — Z7901 Long term (current) use of anticoagulants: Secondary | ICD-10-CM | POA: Insufficient documentation

## 2024-01-25 DIAGNOSIS — I480 Paroxysmal atrial fibrillation: Secondary | ICD-10-CM | POA: Diagnosis not present

## 2024-01-25 DIAGNOSIS — I2510 Atherosclerotic heart disease of native coronary artery without angina pectoris: Secondary | ICD-10-CM | POA: Insufficient documentation

## 2024-01-25 DIAGNOSIS — R079 Chest pain, unspecified: Secondary | ICD-10-CM | POA: Diagnosis present

## 2024-01-25 HISTORY — PX: LEFT HEART CATH AND CORONARY ANGIOGRAPHY: CATH118249

## 2024-01-25 SURGERY — LEFT HEART CATH AND CORONARY ANGIOGRAPHY
Anesthesia: LOCAL

## 2024-01-25 MED ORDER — FENTANYL CITRATE (PF) 100 MCG/2ML IJ SOLN
INTRAMUSCULAR | Status: DC | PRN
  Administered 2024-01-25 (×4): 25 ug via INTRAVENOUS

## 2024-01-25 MED ORDER — VERAPAMIL HCL 2.5 MG/ML IV SOLN
INTRAVENOUS | Status: AC
  Filled 2024-01-25: qty 2

## 2024-01-25 MED ORDER — SODIUM CHLORIDE 0.9% FLUSH: 3.0000 mL | Freq: Two times a day (BID) | INTRAVENOUS | Status: DC

## 2024-01-25 MED ORDER — FREE WATER: 500.0000 mL | Freq: Once | Status: DC

## 2024-01-25 MED ORDER — VERAPAMIL HCL 2.5 MG/ML IV SOLN
INTRAVENOUS | Status: DC | PRN
  Administered 2024-01-25 (×2): 10 mL via INTRA_ARTERIAL

## 2024-01-25 MED ORDER — HYDRALAZINE HCL 20 MG/ML IJ SOLN: 10.0000 mg | INTRAMUSCULAR | Status: DC | PRN

## 2024-01-25 MED ORDER — HEPARIN SODIUM (PORCINE) 1000 UNIT/ML IJ SOLN
INTRAMUSCULAR | Status: DC | PRN
Start: 2024-01-25 — End: 2024-01-25
  Administered 2024-01-25 (×2): 5000 [IU] via INTRAVENOUS

## 2024-01-25 MED ORDER — HEPARIN (PORCINE) IN NACL 1000-0.9 UT/500ML-% IV SOLN
INTRAVENOUS | Status: DC | PRN
  Administered 2024-01-25 (×2): 1000 mL

## 2024-01-25 MED ORDER — IOHEXOL 350 MG/ML SOLN
INTRAVENOUS | Status: DC | PRN
Start: 2024-01-25 — End: 2024-01-25
  Administered 2024-01-25 (×2): 65 mL

## 2024-01-25 MED ORDER — ACETAMINOPHEN 325 MG PO TABS: 650.0000 mg | ORAL_TABLET | ORAL | Status: DC | PRN

## 2024-01-25 MED ORDER — ASPIRIN 81 MG PO CHEW: 81.0000 mg | CHEWABLE_TABLET | ORAL | Status: DC

## 2024-01-25 MED ORDER — ONDANSETRON HCL 4 MG/2ML IJ SOLN: 4.0000 mg | Freq: Four times a day (QID) | INTRAMUSCULAR | Status: DC | PRN

## 2024-01-25 MED ORDER — SODIUM CHLORIDE 0.9% FLUSH
3.0000 mL | INTRAVENOUS | Status: DC | PRN
Start: 2024-01-25 — End: 2024-01-25

## 2024-01-25 MED ORDER — LIDOCAINE HCL (PF) 1 % IJ SOLN
INTRAMUSCULAR | Status: DC | PRN
Start: 2024-01-25 — End: 2024-01-25
  Administered 2024-01-25 (×2): 5 mL via INTRADERMAL

## 2024-01-25 MED ORDER — MIDAZOLAM HCL 2 MG/2ML IJ SOLN
INTRAMUSCULAR | Status: AC
  Filled 2024-01-25: qty 2

## 2024-01-25 MED ORDER — HEPARIN SODIUM (PORCINE) 1000 UNIT/ML IJ SOLN
INTRAMUSCULAR | Status: AC
  Filled 2024-01-25: qty 10

## 2024-01-25 MED ORDER — SODIUM CHLORIDE 0.9 % IV SOLN: 250.0000 mL | INTRAVENOUS | Status: DC | PRN

## 2024-01-25 MED ORDER — FENTANYL CITRATE (PF) 100 MCG/2ML IJ SOLN
INTRAMUSCULAR | Status: AC
Start: 2024-01-25 — End: 2024-01-25
  Filled 2024-01-25: qty 2

## 2024-01-25 MED ORDER — SODIUM CHLORIDE 0.9% FLUSH: 3.0000 mL | INTRAVENOUS | Status: DC | PRN

## 2024-01-25 MED ORDER — MIDAZOLAM HCL 2 MG/2ML IJ SOLN
INTRAMUSCULAR | Status: DC | PRN
  Administered 2024-01-25 (×4): 1 mg via INTRAVENOUS

## 2024-01-25 SURGICAL SUPPLY — 13 items
CATH INFINITI 5FR MULTPACK ANG (CATHETERS) IMPLANT
CATH INFINITI 6F ANG MULTIPACK (CATHETERS) IMPLANT
CATH LAUNCHER 5F RADR (CATHETERS) IMPLANT
DEVICE RAD COMP TR BAND LRG (VASCULAR PRODUCTS) IMPLANT
ELECT DEFIB PAD ADLT CADENCE (PAD) IMPLANT
GLIDESHEATH SLEND SS 6F .021 (SHEATH) IMPLANT
GUIDEWIRE INQWIRE 1.5J.035X260 (WIRE) IMPLANT
KIT SINGLE USE MANIFOLD (KITS) IMPLANT
KIT SYRINGE INJ CVI SPIKEX1 (MISCELLANEOUS) IMPLANT
PACK CARDIAC CATHETERIZATION (CUSTOM PROCEDURE TRAY) ×1 IMPLANT
SET ATX-X65L (MISCELLANEOUS) IMPLANT
SHEATH PROBE COVER 6X72 (BAG) IMPLANT
STATION PROTECTION PRESSURIZED (MISCELLANEOUS) IMPLANT

## 2024-01-25 NOTE — Interval H&P Note (Signed)
 History and Physical Interval Note:  01/25/2024 9:33 AM  Douglas Waters  has presented today for surgery, with the diagnosis of abnormal cta.  The various methods of treatment have been discussed with the patient and family. After consideration of risks, benefits and other options for treatment, the patient has consented to  Procedure(s): LEFT HEART CATH AND CORONARY ANGIOGRAPHY (N/A) as a surgical intervention.  The patient's history has been reviewed, patient examined, no change in status, stable for surgery.  I have reviewed the patient's chart and labs.  Questions were answered to the patient's satisfaction.   Cath Lab Visit (complete for each Cath Lab visit)  Clinical Evaluation Leading to the Procedure:   ACS: No.  Non-ACS:    Anginal Classification: CCS II  Anti-ischemic medical therapy: No Therapy  Non-Invasive Test Results: Intermediate-risk stress test findings: cardiac mortality 1-3%/year  Prior CABG: No previous CABG        [name] 01/25/2024 9:33 AM

## 2024-01-25 NOTE — Discharge Instructions (Addendum)
 May resume Xarelto  this evening

## 2024-01-31 ENCOUNTER — Encounter: Payer: Self-pay | Admitting: Internal Medicine

## 2024-01-31 ENCOUNTER — Ambulatory Visit (INDEPENDENT_AMBULATORY_CARE_PROVIDER_SITE_OTHER): Admitting: Internal Medicine

## 2024-01-31 ENCOUNTER — Ambulatory Visit: Payer: Self-pay

## 2024-01-31 VITALS — BP 120/70 | HR 67 | Temp 98.3°F | Ht 72.0 in | Wt 221.0 lb

## 2024-01-31 DIAGNOSIS — Z7901 Long term (current) use of anticoagulants: Secondary | ICD-10-CM

## 2024-01-31 DIAGNOSIS — G4733 Obstructive sleep apnea (adult) (pediatric): Secondary | ICD-10-CM

## 2024-01-31 DIAGNOSIS — Z9889 Other specified postprocedural states: Secondary | ICD-10-CM

## 2024-01-31 DIAGNOSIS — G43011 Migraine without aura, intractable, with status migrainosus: Secondary | ICD-10-CM | POA: Diagnosis not present

## 2024-01-31 DIAGNOSIS — I10 Essential (primary) hypertension: Secondary | ICD-10-CM | POA: Diagnosis not present

## 2024-01-31 DIAGNOSIS — G8928 Other chronic postprocedural pain: Secondary | ICD-10-CM

## 2024-01-31 DIAGNOSIS — M549 Dorsalgia, unspecified: Secondary | ICD-10-CM

## 2024-01-31 DIAGNOSIS — G8929 Other chronic pain: Secondary | ICD-10-CM

## 2024-01-31 MED ORDER — METHYLPREDNISOLONE 4 MG PO TABS: 4.0000 mg | ORAL_TABLET | Freq: Every day | ORAL | 0 refills | Status: DC
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
## 2024-01-31 NOTE — Telephone Encounter (Signed)
 Patient's wife called in to request an appointment. Patient has been having headaches for a couple of weeks and now having nosebleeds. Attempted to ask questions for triage-wife refused several times to answer questions stating Do I need to go to the office to make an appointment?  Wife hung up while trying to speak to CAL. Will route to office. Will call CAL as an FYI  Copied from CRM [id number]. Topic: Clinical - Red Word Triage >> Jan 31, 2024  9:10 AM Sasha H wrote: Red Word that prompted transfer to Nurse Triage: Pt's wife called in stating that pt has been having headaches for a couple of weeks and now is having nose bleeds.

## 2024-02-01 NOTE — Patient Instructions (Signed)
 Please take Medrol  taper as prescribed. Call if not better in 48 hours or sooner if worse. Have placed referral to Neurology.

## 2024-02-08 ENCOUNTER — Ambulatory Visit (HOSPITAL_COMMUNITY)
Admission: RE | Admit: 2024-02-08 | Discharge: 2024-02-08 | Disposition: A | Discharge status: Home / Self Care | Source: Ambulatory Visit | Attending: Cardiology | Admitting: Cardiology

## 2024-02-08 DIAGNOSIS — R079 Chest pain, unspecified: Secondary | ICD-10-CM | POA: Insufficient documentation

## 2024-02-08 LAB — ECHOCARDIOGRAM COMPLETE
Area-P 1/2: 2.86 cm2
MV VTI: 1.52 cm2
S' Lateral: 3.91 cm

## 2024-02-10 ENCOUNTER — Telehealth: Payer: Self-pay

## 2024-02-10 ENCOUNTER — Encounter: Payer: Self-pay | Admitting: Nurse Practitioner

## 2024-02-10 ENCOUNTER — Ambulatory Visit: Attending: Nurse Practitioner | Admitting: Nurse Practitioner

## 2024-02-10 VITALS — BP 114/68 | HR 64 | Ht 72.0 in | Wt 222.6 lb

## 2024-02-10 DIAGNOSIS — I6521 Occlusion and stenosis of right carotid artery: Secondary | ICD-10-CM

## 2024-02-10 DIAGNOSIS — Z9889 Other specified postprocedural states: Secondary | ICD-10-CM | POA: Diagnosis not present

## 2024-02-10 DIAGNOSIS — I340 Nonrheumatic mitral (valve) insufficiency: Secondary | ICD-10-CM | POA: Diagnosis not present

## 2024-02-10 DIAGNOSIS — I2510 Atherosclerotic heart disease of native coronary artery without angina pectoris: Secondary | ICD-10-CM | POA: Diagnosis not present

## 2024-02-10 DIAGNOSIS — I4821 Permanent atrial fibrillation: Secondary | ICD-10-CM

## 2024-02-10 DIAGNOSIS — I77810 Thoracic aortic ectasia: Secondary | ICD-10-CM

## 2024-02-10 DIAGNOSIS — I471 Supraventricular tachycardia, unspecified: Secondary | ICD-10-CM

## 2024-02-10 DIAGNOSIS — I10 Essential (primary) hypertension: Secondary | ICD-10-CM

## 2024-02-10 NOTE — Patient Instructions (Signed)
 Medication Instructions:  Your physician recommends that you continue on your current medications as directed. Please refer to the Current Medication list given to you today.  *If you need a refill on your cardiac medications before your next appointment, please call your pharmacy*  Lab Work: NONE ordered at this time of appointment   Testing/Procedures: NONE ordered at this time of appointment   Follow-Up: At [facility], you and your health needs are our priority.  As part of our continuing mission to provide you with exceptional heart care, our providers are all part of one team.  This team includes your primary Cardiologist (physician) and Advanced Practice Providers or APPs (Physician Assistants and Nurse Practitioners) who all work together to provide you with the care you need, when you need it.  Your next appointment:   2-3 month(s)  Provider:   Dr. Waddell     We recommend signing up for the patient portal called MyChart.  Sign up information is provided on this After Visit Summary.  MyChart is used to connect with patients for Virtual Visits (Telemedicine).  Patients are able to view lab/test results, encounter notes, upcoming appointments, etc.  Non-urgent messages can be sent to your provider as well.   To learn more about what you can do with MyChart, go to [email or web address].

## 2024-02-10 NOTE — Progress Notes (Signed)
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 THE COUNTER MEDICATION Take 1 capsule by mouth daily as  needed (Frequent urination). Prostagenix supplement     polyethylene glycol (MIRALAX / GLYCOLAX) packet Take 17 g by mouth every other day.     rivaroxaban  (XARELTO ) 20 MG TABS tablet TAKE ONE TABLET BY MOUTH EVERY DAY WITH SUPPER 180 tablet 1   No current facility-administered medications for this visit.     Review of Systems    He denies palpitations, pnd, orthopnea, n, v, dizziness, syncope, edema, weight gain, or early satiety. All other systems reviewed and are otherwise negative except as noted above.   Physical Exam    VS:  BP 114/68   Pulse 64   Ht 6' (1.829 m)   Wt 222 lb 9.6 oz (101 kg)   SpO2 94%   BMI 30.19 kg/m   GEN: Well nourished, well developed,  in no acute distress. HEENT: normal. Neck: Supple, no JVD, carotid bruits, or masses. Cardiac: IRIR, no murmurs, rubs, or gallops. No clubbing, cyanosis, edema.  Radials/DP/PT 2+ and equal bilaterally.  Respiratory:  Respirations regular and unlabored, clear to auscultation bilaterally. GI: Soft, nontender, nondistended, BS + x 4. MS: no deformity or atrophy. Skin: warm and dry, no rash. Neuro:  Strength and sensation are intact. Psych: Normal affect.  Accessory Clinical Findings    ECG personally reviewed by me today - EKG Interpretation Date/Time:  Friday February 10 2024 10:24:59 EDT Ventricular Rate:  64 PR Interval:    QRS Duration:  88 QT Interval:  420 QTC Calculation: 433 R Axis:   0  Text Interpretation: Atrial fibrillation When compared with ECG of 03-Jan-2024 14:31, No significant change was found Confirmed by Daneen Perkins ([id number]) on 02/10/2024 10:33:12 AM  - no acute changes.   Lab Results  Component Value Date   WBC 7.5 01/19/2024   HGB 14.7 01/19/2024   HCT 45.7 01/19/2024   MCV 82 01/19/2024   PLT 231 01/19/2024   Lab Results  Component Value Date   CREATININE 0.96 01/19/2024   BUN 17 01/19/2024   NA 140 01/19/2024   K 5.0 01/19/2024   CL 98 01/19/2024   CO2 23 01/19/2024   Lab Results  Component Value Date   ALT 8 (L) 04/11/2023   AST 15 04/11/2023   ALKPHOS 88 08/20/2022   BILITOT 1.3 (H) 04/11/2023   Lab Results  Component Value Date   CHOL 143 04/11/2023   HDL 40 04/11/2023   LDLCALC 87 04/11/2023   TRIG 74 04/11/2023   CHOLHDL 3.6 04/11/2023    Lab Results  Component Value Date   HGBA1C 5.2 04/21/2022    Assessment & Plan    1. Nonobstructive CAD:   Coronary CT angiogram in 12/2023 revealed coronary calcium  score of 1412 (85th percentile), extensive TPV, possible flow-limiting LAD lesion. Given borderline FFR on coronary CT angiogram, he underwent outpatient diagnostic cardiac catheterization on 01/25/2024 which revealed nonobstructive  CAD, 25% proximal to mid LAD stenosis, EF 55 to 65%.  He continues to note intermittent fleeting chest discomfort, intermittent shortness of breath with activity, generalized fatigue.  Symptoms overall unchanged.  Cardiac workup to date reassuring.  No ASA in the setting of chronic DOAC therapy. Continue amlodipine .  Declines lipid-lowering therapy as below.  2. Mitral valve regurgitation: Echocardiogram in 01/2024 showed EF 50 to 55%, low normal LV function, no RWMA, mild LVH, normal RV systolic function, severely dilated left atrium, mild mitral valve regurgitation with stable mitral valve repair, mild dilation of the aortic root measuring 39 mm, mild elation of ascending aorta measuring 43 mm.  Euvolemic and well compensated on exam.  Continue SBE prophylaxis.  3. Persistent atrial fibrillation/SVT: He has a history of SVT, paroxysmal atrial fibrillation, junctional rhythm with transient pauses, followed by Dr. Waddell, EP.  EKG today shows rate controlled atrial fibrillation.  Recent cath, echo reassuring.  He continues to note generalized fatigue, intermittent fleeting chest discomfort.  Denies any significant palpitations.  We discussed possibility of repeat cardiac monitor, however, he declines. Continue Xarelto .  4. Mild dilation ascending aorta: Mild dilation of the aortic root measuring 39 mm, mild elation of ascending aorta measuring 43 mm. Continue to monitor on annual basis.   5. Carotid artery stenosis: Most recent carotid ultrasound in 05/2023 showed patent right carotid stent, 1 to 39% LICA stenosis.  Followed by vascular surgery.   6. Hypertension: BP well controlled. Continue current antihypertensive regimen.    7. Hyperlipidemia: LDL was 87 in 03/2023.  Previously on atorvastatin , simvastatin.  He had significant fatigue with these medications. We discussed consideration of alternative lipid-lowering therapy, he declines.    8. Disposition: Follow-up in 2 to 3 months with Dr. Waddell.   We discussed having him follow-up with a general cardiologist, he declines at this time.      Damien JAYSON Braver, NP 02/10/2024, 10:35 AM

## 2024-02-10 NOTE — Telephone Encounter (Signed)
 Patient's wife, Douglas Waters, called to state that Douglas Waters has been having near constant headaches and complaining of shortness of breath at times.  This nurse advised that Douglas Waters' PCP needs to be made aware of Douglas Waters' condition and to make sure that [facility] stays on his anticoagulant.  Douglas Waters states understanding.

## 2024-02-12 ENCOUNTER — Encounter: Payer: Self-pay | Admitting: Nurse Practitioner

## 2024-03-26 ENCOUNTER — Telehealth: Payer: Self-pay | Admitting: Internal Medicine

## 2024-03-26 NOTE — Telephone Encounter (Signed)
 Pt c/o Shortness Of Breath: STAT if SOB developed within the last 24 hours or pt is noticeably SOB on the phone  1. Are you currently SOB (can you hear that pt is SOB on the phone)? no  2. How long have you been experiencing SOB? no  3. Are you SOB when sitting or when up moving around? yes  4. Are you currently experiencing any other symptoms? Dull headache, lack in energy

## 2024-03-26 NOTE — Telephone Encounter (Signed)
 Spoke with patient's wife Pam (OK per DPR). She reports patient has been having symptoms similar to when his valve was leaking: shortness of breath with activity, no energy.  He has appt with Dr. Waddell on 04/25/24. Patient and his wife are requesting to see Dr. Waddell sooner, and would like to be placed on waitlist for Dr. Waddell for any cancellations if nothing sooner open right now.  Will forward to EP scheduler. Will also forward to Damien Braver, NP to review.

## 2024-03-28 NOTE — Telephone Encounter (Signed)
 Spoke w/ patient and wife (in background) after getting approval to double book Dr. Waddell - patient has been rescheduled to see Dr. Waddell on 10/22. Patient was grateful for sooner appointment.,

## 2024-04-04 ENCOUNTER — Ambulatory Visit

## 2024-04-04 ENCOUNTER — Ambulatory Visit: Attending: Internal Medicine | Admitting: Internal Medicine

## 2024-04-04 ENCOUNTER — Encounter: Payer: Self-pay | Admitting: Internal Medicine

## 2024-04-04 VITALS — BP 166/82 | HR 71 | Ht 72.0 in | Wt 228.1 lb

## 2024-04-04 DIAGNOSIS — I4821 Permanent atrial fibrillation: Secondary | ICD-10-CM

## 2024-04-04 NOTE — Patient Instructions (Addendum)
 Medication Instructions:  Your physician recommends that you continue on your current medications as directed. Please refer to the Current Medication list given to you today.  *If you need a refill on your cardiac medications before your next appointment, please call your pharmacy*  Lab Work: None ordered.  You may go to any [facility] Location for your lab work:  [facility] - [id number] ([facility]) - [phone] [address]  [place] - [address]   [place]  - [address]   [place] - [address] - [address] [employer]  - [address] [place] - [address] ([facility]   If you have labs (blood work) drawn today and your tests are completely normal, you will receive your results only by: [employer] (if you have MyChart)  If you have any lab test that is abnormal or we need to change your treatment, we will call you or send a MyChart message to review the results.  Testing/Procedures: GEOFFRY HEWS- Long Term Monitor Instructions  Your physician has requested you wear a ZIO patch monitor for ____ days.   This is a single patch monitor. [employer] supplies one patch monitor per enrollment. Additional  stickers are not available. Please do not apply patch if you will be having a Nuclear Stress Test,  Echocardiogram, Cardiac CT, MRI, or Chest Xray during the period you would be wearing the  monitor. The patch cannot be worn during these tests. You cannot remove and re-apply the  ZIO XT patch monitor.   Your ZIO patch monitor will be mailed 3 day USPS to your address on file. It may take 3-5 days  to receive your monitor after you have been enrolled.   Once you have received your monitor, please review the enclosed instructions. Your monitor  has already been registered assigning a specific monitor serial # to you.     Billing and Patient  Assistance Program Information  We have supplied [employer] with any of your insurance information on file for billing purposes.  [employer] offers a sliding scale Patient Assistance Program for patients that do not have  insurance, or whose insurance does not completely cover the cost of the ZIO monitor.  You must apply for the Patient Assistance Program to qualify for this discounted rate.   To apply, please call [employer] at [phone], select option 4, select option 2, ask to apply for  Patient Assistance Program. Meredeth will ask your household income, and how many people  are in your household. They will quote your out-of-pocket cost based on that information.  [employer] will also be able to set up a 16-month, interest-free payment plan if needed.     Applying the monitor  Shave hair from upper left chest.  Hold abrader disc by orange tab. Rub abrader in 40 strokes over the upper left chest as  indicated in your monitor instructions.  Clean area with 4 enclosed alcohol pads. Let dry.  Apply patch as indicated in monitor instructions. Patch will be placed under collarbone on left  side of chest with arrow pointing upward.  Rub patch adhesive wings for 2 minutes. Remove white label marked 1. Remove the white  label marked 2. Rub patch adhesive wings for 2 additional minutes.  While looking in a mirror, press and release button in center of patch. A small green light  will  flash 3-4 times. This will be your only indicator that the monitor has been turned on.  Do not shower for the first 24 hours. You may shower after the first 24 hours.  Press the button if you feel a symptom. You will hear a small click. Record Date, Time and  Symptom in the Patient Logbook.  When you are ready to remove the patch, follow instructions on the last 2 pages of Patient  Logbook. Stick patch monitor onto the last page of Patient Logbook.   Place Patient Logbook in the blue and white box. Use locking tab on  box and tape box closed  securely. The blue and white box has prepaid postage on it. Please place it in the mailbox as  soon as possible. Your physician should have your test results approximately 7 days after the  monitor has been mailed back to [facility].   Call [facility] Customer Care at [phone] if you have questions regarding  your ZIO XT patch monitor. Call them immediately if you see an orange light blinking on your  monitor.   If your monitor falls off in less than 4 days, contact our Monitor department at [phone].   If your monitor becomes loose or falls off after 4 days call [employer] at [phone] for  suggestions on securing your monitor.     Follow-Up: At [facility], you and your health needs are our priority.  As part of our continuing mission to provide you with exceptional heart care, we have created designated Provider Care Teams.  These Care Teams include your primary Cardiologist (physician) and Advanced Practice Providers (APPs -  Physician Assistants and Nurse Practitioners) who all work together to provide you with the care you need, when you need it.   Your next appointment:   To be determined based on testing

## 2024-04-04 NOTE — Progress Notes (Unsigned)
 Enrolled patient for a 3 day Zio XT monitor to be mailed to patients home
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
## 2024-04-16 ENCOUNTER — Other Ambulatory Visit: Payer: Medicare Other

## 2024-04-16 DIAGNOSIS — Z0000 Encounter for general adult medical examination without abnormal findings: Secondary | ICD-10-CM

## 2024-04-16 DIAGNOSIS — Z8639 Personal history of other endocrine, nutritional and metabolic disease: Secondary | ICD-10-CM

## 2024-04-16 DIAGNOSIS — I2510 Atherosclerotic heart disease of native coronary artery without angina pectoris: Secondary | ICD-10-CM

## 2024-04-16 DIAGNOSIS — Z125 Encounter for screening for malignant neoplasm of prostate: Secondary | ICD-10-CM

## 2024-04-16 DIAGNOSIS — I10 Essential (primary) hypertension: Secondary | ICD-10-CM

## 2024-04-16 DIAGNOSIS — C099 Malignant neoplasm of tonsil, unspecified: Secondary | ICD-10-CM

## 2024-04-16 DIAGNOSIS — Z9889 Other specified postprocedural states: Secondary | ICD-10-CM

## 2024-04-16 DIAGNOSIS — Z13220 Encounter for screening for lipoid disorders: Secondary | ICD-10-CM

## 2024-04-16 DIAGNOSIS — G4733 Obstructive sleep apnea (adult) (pediatric): Secondary | ICD-10-CM

## 2024-04-16 DIAGNOSIS — Z7901 Long term (current) use of anticoagulants: Secondary | ICD-10-CM

## 2024-04-17 ENCOUNTER — Ambulatory Visit (INDEPENDENT_AMBULATORY_CARE_PROVIDER_SITE_OTHER): Payer: Medicare Other | Admitting: Internal Medicine

## 2024-04-17 ENCOUNTER — Encounter: Payer: Self-pay | Admitting: Internal Medicine

## 2024-04-17 VITALS — BP 150/80 | HR 68 | Ht 72.0 in | Wt 222.0 lb

## 2024-04-17 DIAGNOSIS — I10 Essential (primary) hypertension: Secondary | ICD-10-CM

## 2024-04-17 DIAGNOSIS — Z0000 Encounter for general adult medical examination without abnormal findings: Secondary | ICD-10-CM | POA: Diagnosis not present

## 2024-04-17 DIAGNOSIS — Z7901 Long term (current) use of anticoagulants: Secondary | ICD-10-CM

## 2024-04-17 DIAGNOSIS — N400 Enlarged prostate without lower urinary tract symptoms: Secondary | ICD-10-CM

## 2024-04-17 DIAGNOSIS — Z8639 Personal history of other endocrine, nutritional and metabolic disease: Secondary | ICD-10-CM

## 2024-04-17 DIAGNOSIS — I2510 Atherosclerotic heart disease of native coronary artery without angina pectoris: Secondary | ICD-10-CM

## 2024-04-17 DIAGNOSIS — E7800 Pure hypercholesterolemia, unspecified: Secondary | ICD-10-CM

## 2024-04-17 DIAGNOSIS — Z9889 Other specified postprocedural states: Secondary | ICD-10-CM

## 2024-04-17 DIAGNOSIS — E785 Hyperlipidemia, unspecified: Secondary | ICD-10-CM

## 2024-04-17 DIAGNOSIS — Z23 Encounter for immunization: Secondary | ICD-10-CM | POA: Diagnosis not present

## 2024-04-17 DIAGNOSIS — F419 Anxiety disorder, unspecified: Secondary | ICD-10-CM | POA: Diagnosis not present

## 2024-04-17 DIAGNOSIS — I4821 Permanent atrial fibrillation: Secondary | ICD-10-CM | POA: Diagnosis not present

## 2024-04-17 DIAGNOSIS — G4733 Obstructive sleep apnea (adult) (pediatric): Secondary | ICD-10-CM

## 2024-04-17 DIAGNOSIS — G8929 Other chronic pain: Secondary | ICD-10-CM

## 2024-04-17 DIAGNOSIS — Z1211 Encounter for screening for malignant neoplasm of colon: Secondary | ICD-10-CM

## 2024-04-17 LAB — LIPID PANEL
Cholesterol: 155 mg/dL (ref <200)
HDL: 45 mg/dL (ref >=40)
LDL Cholesterol (Calc): 95 mg/dL
Non-HDL Cholesterol (Calc): 110 mg/dL (ref <130)
Total CHOL/HDL Ratio: 3.4 (calc) (ref <5.0)
Triglycerides: 63 mg/dL (ref <150)

## 2024-04-17 LAB — COMPREHENSIVE METABOLIC PANEL WITH GFR
AG Ratio: 1.7 (calc) (ref 1.0–2.5)
ALT: 8 U/L — ABNORMAL LOW (ref 9–46)
AST: 17 U/L (ref 10–35)
Albumin: 4.4 g/dL (ref 3.6–5.1)
Alkaline phosphatase (APISO): 78 U/L (ref 35–144)
BUN/Creatinine Ratio: 17 (calc) (ref 6–22)
BUN: 12 mg/dL (ref 7–25)
CO2: 30 mmol/L (ref 20–32)
Calcium: 9.7 mg/dL (ref 8.6–10.3)
Chloride: 101 mmol/L (ref 98–110)
Creat: 0.69 mg/dL — ABNORMAL LOW (ref 0.70–1.28)
Globulin: 2.6 g/dL (ref 1.9–3.7)
Glucose, Bld: 99 mg/dL (ref 65–99)
Potassium: 4.4 mmol/L (ref 3.5–5.3)
Sodium: 139 mmol/L (ref 135–146)
Total Bilirubin: 1.6 mg/dL — ABNORMAL HIGH (ref 0.2–1.2)
Total Protein: 7 g/dL (ref 6.1–8.1)
eGFR: 97 mL/min/1.73m2 (ref >=60)

## 2024-04-17 LAB — CBC WITH DIFFERENTIAL/PLATELET
Absolute Lymphocytes: 797 {cells}/uL — ABNORMAL LOW (ref 850–3900)
Absolute Monocytes: 379 {cells}/uL (ref 200–950)
Basophils Absolute: 29 {cells}/uL (ref 0–200)
Basophils Relative: 0.6 %
Eosinophils Absolute: 418 {cells}/uL (ref 15–500)
Eosinophils Relative: 8.7 %
HCT: 43.8 % (ref 38.5–50.0)
Hemoglobin: 14.2 g/dL (ref 13.2–17.1)
MCH: 27 pg (ref 27.0–33.0)
MCHC: 32.4 g/dL (ref 32.0–36.0)
MCV: 83.3 fL (ref 80.0–100.0)
MPV: 10.3 fL (ref 7.5–12.5)
Monocytes Relative: 7.9 %
Neutro Abs: 3178 {cells}/uL (ref 1500–7800)
Neutrophils Relative %: 66.2 %
Platelets: 213 Thousand/uL (ref 140–400)
RBC: 5.26 Million/uL (ref 4.20–5.80)
RDW: 13.3 % (ref 11.0–15.0)
Total Lymphocyte: 16.6 %
WBC: 4.8 Thousand/uL (ref 3.8–10.8)

## 2024-04-17 LAB — POCT URINALYSIS DIP (CLINITEK)
Bilirubin, UA: NEGATIVE
Blood, UA: NEGATIVE
Glucose, UA: NEGATIVE mg/dL
Ketones, POC UA: NEGATIVE mg/dL
Leukocytes, UA: NEGATIVE
Nitrite, UA: NEGATIVE
POC PROTEIN,UA: NEGATIVE
Spec Grav, UA: 1.015 (ref 1.010–1.025)
Urobilinogen, UA: 0.2 U/dL
pH, UA: 6.5 (ref 5.0–8.0)

## 2024-04-17 LAB — PSA: PSA: 1.39 ng/mL (ref <=4.00)

## 2024-04-17 NOTE — Patient Instructions (Signed)
 Douglas Waters,  Thank you for taking the time for your Medicare Wellness Visit. I appreciate your continued commitment to your health goals. Please review the care plan we discussed, and feel free to reach out if I can assist you further.  Please note that Annual Wellness Visits do not include a physical exam. Some assessments may be limited, especially if the visit was conducted virtually. If needed, we may recommend an in-person follow-up with your provider.  Ongoing Care Seeing your primary care provider every 3 to 6 months helps us  monitor your health and provide consistent, personalized care.   Referrals If a referral was made during today's visit and you haven't received any updates within two weeks, please contact the referred provider directly to check on the status.  Recommended Screenings:  Health Maintenance  Topic Date Due   DTaP/Tdap/Td vaccine (1 - Tdap) Never done   Colon Cancer Screening  07/21/2022   COVID-19 Vaccine (1) 05/03/2024*   Zoster (Shingles) Vaccine (1 of 2) 12/05/2024*   Medicare Annual Wellness Visit  04/17/2025   Pneumococcal Vaccine for age over 55  Completed   Flu Shot  Completed   Meningitis B Vaccine  Aged Out   Breast Cancer Screening  Discontinued   Hepatitis C Screening  Discontinued  *Topic was postponed. The date shown is not the original due date.       04/17/2024    9:51 AM  Advanced Directives  Does Patient Have a Medical Advance Directive? No  Would patient like information on creating a medical advance directive? No - Patient declined    Vision: Annual vision screenings are recommended for early detection of glaucoma, cataracts, and diabetic retinopathy. These exams can also reveal signs of chronic conditions such as diabetes and high blood pressure.  Dental: Annual dental screenings help detect early signs of oral cancer, gum disease, and other conditions linked to overall health, including heart disease and diabetes.  Please see the  attached documents for additional preventive care recommendations.     Next appointment: Follow up in one year for your annual wellness visit.   Preventive Care 75 Years and Older, Male Preventive care refers to lifestyle choices and visits with your health care provider that can promote health and wellness. What does preventive care include? A yearly physical exam. This is also called an annual well check. Dental exams once or twice a year. Routine eye exams. Ask your health care provider how often you should have your eyes checked. Personal lifestyle choices, including: Daily care of your teeth and gums. Regular physical activity. Eating a healthy diet. Avoiding tobacco and drug use. Limiting alcohol use. Practicing safe sex. Taking low doses of aspirin  every day. Taking vitamin and mineral supplements as recommended by your health care provider. What happens during an annual well check? The services and screenings done by your health care provider during your annual well check will depend on your age, overall health, lifestyle risk factors, and family history of disease. Counseling  Your health care provider may ask you questions about your: Alcohol use. Tobacco use. Drug use. Emotional well-being. Home and relationship well-being. Sexual activity. Eating habits. History of falls. Memory and ability to understand (cognition). Work and work astronomer. Screening  You may have the following tests or measurements: Height, weight, and BMI. Blood pressure. Lipid and cholesterol levels. These may be checked every 5 years, or more frequently if you are over 74 years old. Skin check. Lung cancer screening. You may have this screening  every year starting at age 74 if you have a 30-pack-year history of smoking and currently smoke or have quit within the past 15 years. Fecal occult blood test (FOBT) of the stool. You may have this test every year starting at age 74. Flexible  sigmoidoscopy or colonoscopy. You may have a sigmoidoscopy every 5 years or a colonoscopy every 10 years starting at age 13. Prostate cancer screening. Recommendations will vary depending on your family history and other risks. Hepatitis C blood test. Hepatitis B blood test. Sexually transmitted disease (STD) testing. Diabetes screening. This is done by checking your blood sugar (glucose) after you have not eaten for a while (fasting). You may have this done every 1-3 years. Abdominal aortic aneurysm (AAA) screening. You may need this if you are a current or former smoker. Osteoporosis. You may be screened starting at age 74 if you are at high risk. Talk with your health care provider about your test results, treatment options, and if necessary, the need for more tests. Vaccines  Your health care provider may recommend certain vaccines, such as: Influenza vaccine. This is recommended every year. Tetanus, diphtheria, and acellular pertussis (Tdap, Td) vaccine. You may need a Td booster every 10 years. Zoster vaccine. You may need this after age 74. Pneumococcal 13-valent conjugate (PCV13) vaccine. One dose is recommended after age 74. Pneumococcal polysaccharide (PPSV23) vaccine. One dose is recommended after age 74. Talk to your health care provider about which screenings and vaccines you need and how often you need them. This information is not intended to replace advice given to you by your health care provider. Make sure you discuss any questions you have with your health care provider. Document Released: 06/27/2015 Document Revised: 02/18/2016 Document Reviewed: 04/01/2015 Elsevier Interactive Patient Education  2017 [employer].  Fall Prevention in the Home Falls can cause injuries. They can happen to people of all ages. There are many things you can do to make your home safe and to help prevent falls. What can I do on the outside of my home? Regularly fix the edges of walkways and  driveways and fix any cracks. Remove anything that might make you trip as you walk through a door, such as a raised step or threshold. Trim any bushes or trees on the path to your home. Use bright outdoor lighting. Clear any walking paths of anything that might make someone trip, such as rocks or tools. Regularly check to see if handrails are loose or broken. Make sure that both sides of any steps have handrails. Any raised decks and porches should have guardrails on the edges. Have any leaves, snow, or ice cleared regularly. Use sand or salt on walking paths during winter. Clean up any spills in your garage right away. This includes oil or grease spills. What can I do in the bathroom? Use night lights. Install grab bars by the toilet and in the tub and shower. Do not use towel bars as grab bars. Use non-skid mats or decals in the tub or shower. If you need to sit down in the shower, use a plastic, non-slip stool. Keep the floor dry. Clean up any water  that spills on the floor as soon as it happens. Remove soap buildup in the tub or shower regularly. Attach bath mats securely with double-sided non-slip rug tape. Do not have throw rugs and other things on the floor that can make you trip. What can I do in the bedroom? Use night lights. Make sure that you have  a light by your bed that is easy to reach. Do not use any sheets or blankets that are too big for your bed. They should not hang down onto the floor. Have a firm chair that has side arms. You can use this for support while you get dressed. Do not have throw rugs and other things on the floor that can make you trip. What can I do in the kitchen? Clean up any spills right away. Avoid walking on wet floors. Keep items that you use a lot in easy-to-reach places. If you need to reach something above you, use a strong step stool that has a grab bar. Keep electrical cords out of the way. Do not use floor polish or wax that makes floors  slippery. If you must use wax, use non-skid floor wax. Do not have throw rugs and other things on the floor that can make you trip. What can I do with my stairs? Do not leave any items on the stairs. Make sure that there are handrails on both sides of the stairs and use them. Fix handrails that are broken or loose. Make sure that handrails are as long as the stairways. Check any carpeting to make sure that it is firmly attached to the stairs. Fix any carpet that is loose or worn. Avoid having throw rugs at the top or bottom of the stairs. If you do have throw rugs, attach them to the floor with carpet tape. Make sure that you have a light switch at the top of the stairs and the bottom of the stairs. If you do not have them, ask someone to add them for you. What else can I do to help prevent falls? Wear shoes that: Do not have high heels. Have rubber bottoms. Are comfortable and fit you well. Are closed at the toe. Do not wear sandals. If you use a stepladder: Make sure that it is fully opened. Do not climb a closed stepladder. Make sure that both sides of the stepladder are locked into place. Ask someone to hold it for you, if possible. Clearly mark and make sure that you can see: Any grab bars or handrails. First and last steps. Where the edge of each step is. Use tools that help you move around (mobility aids) if they are needed. These include: Canes. Walkers. Scooters. Crutches. Turn on the lights when you go into a dark area. Replace any light bulbs as soon as they burn out. Set up your furniture so you have a clear path. Avoid moving your furniture around. If any of your floors are uneven, fix them. If there are any pets around you, be aware of where they are. Review your medicines with your doctor. Some medicines can make you feel dizzy. This can increase your chance of falling. Ask your doctor what other things that you can do to help prevent falls. This information is not  intended to replace advice given to you by your health care provider. Make sure you discuss any questions you have with your health care provider. Document Released: 03/27/2009 Document Revised: 11/06/2015 Document Reviewed: 07/05/2014 Elsevier Interactive Patient Education  2017 [employer].  Labs are stable. Continue current medications. Watch diet. Continue to be as active as possible. Flu vaccine given. Referral to [facility] placed for Colonoscopy (Dr. Donnald) Return in one year or as needed.
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
## 2024-04-19 ENCOUNTER — Encounter: Payer: Self-pay | Admitting: Internal Medicine

## 2024-04-23 ENCOUNTER — Encounter: Payer: Self-pay | Admitting: Internal Medicine

## 2024-04-25 ENCOUNTER — Ambulatory Visit: Admitting: Internal Medicine

## 2024-05-01 ENCOUNTER — Encounter: Payer: Self-pay | Admitting: Internal Medicine

## 2024-05-01 ENCOUNTER — Ambulatory Visit (INDEPENDENT_AMBULATORY_CARE_PROVIDER_SITE_OTHER): Admitting: Internal Medicine

## 2024-05-01 VITALS — BP 120/70 | HR 66 | Ht 72.0 in | Wt 220.0 lb

## 2024-05-01 DIAGNOSIS — I10 Essential (primary) hypertension: Secondary | ICD-10-CM | POA: Diagnosis not present

## 2024-05-01 DIAGNOSIS — J069 Acute upper respiratory infection, unspecified: Secondary | ICD-10-CM

## 2024-05-01 DIAGNOSIS — R519 Headache, unspecified: Secondary | ICD-10-CM

## 2024-05-01 MED ORDER — AZITHROMYCIN 250 MG PO TABS: ORAL_TABLET | ORAL | 0 refills | Status: AC

## 2024-05-01 MED ORDER — BENZONATATE 100 MG PO CAPS: 100.0000 mg | ORAL_CAPSULE | Freq: Three times a day (TID) | ORAL | 0 refills | Status: AC | PRN

## 2024-05-01 NOTE — Progress Notes (Signed)
 Patient Care Team: Perri Ronal PARAS, MD as PCP - General (Internal Medicine) Waddell Danelle ORN, MD as PCP - Cardiology (Cardiology) Dewey Rush, MD (Radiation Oncology) Ethyl Lonni BRAVO, MD (Inactive) (Otolaryngology) Lonn Hicks, MD as Consulting Physician (Hematology and Oncology) Gastroenterology, Margarete (Gastroenterology)  Visit Date: 05/01/24  Subjective:    Patient ID: Douglas Waters , Male   DOB: [birth year], 73 y.o.    MRN: [id number]   73 y.o. Male presents today for elevated blood pressure. Patient has a past medical history of Hypertension, Hyperlipidemia, Sleep apnea, Anxiety.  History of Hypertension treated with Amlodipine  5 mg. Blood pressure today is normal at 120/70 initially and 120/78 on recheck. Blood pressure readings at home range from 119-171 systolic and 66-89 diastolic. He is unsure if he is using the correct sized blood pressure cuff  with his  home blood pressure device.    He said that he is still continuing to get headaches. He mentioned that he does no sleep well. This is longstanding for years and  has had  this for a long time due to his back pain. He sees his Neurologist, Dr. Rush Blumenthal for an MRI on Thursday. His last visit was on 04/23/2024.    Recently received Influenza vaccine and shortly after developed congestion and a productive cough.    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 Wife retired from [employer] where she was employed in a clerical position. Has adult children. Non-smoker. No alcohol consumption. He used chewing tobacco in the remote past.       Review of Systems  HENT:  Positive for congestion. Negative for sore throat.   Respiratory:  Positive for cough and sputum production.   Neurological:  Positive for headaches.        Objective:   Vitals: BP 120/70   Pulse 66   Ht 6' (1.829 m)   Wt 220 lb (99.8 kg)   SpO2 97%   BMI 29.84 kg/m    Physical Exam Pulmonary:     Effort: Pulmonary effort is normal.     Breath sounds: Normal breath sounds.       Results:      Labs:       Component Value Date/Time   NA 139 04/16/2024 0906   NA 140 01/19/2024 1411   NA 140  09/19/2013 1105   K 4.4 04/16/2024 0906   K 3.9 09/19/2013 1105   CL 101 04/16/2024 0906   CL 103 09/20/2012 0953   CO2 30 04/16/2024 0906   CO2 29 09/19/2013 1105   GLUCOSE 99 04/16/2024 0906   GLUCOSE 83 09/19/2013 1105   GLUCOSE 103 (H) 09/20/2012 0953   BUN 12 04/16/2024 0906   BUN 17 01/19/2024 1411   BUN 11.2 09/19/2013 1105   CREATININE 0.69 (L) 04/16/2024 0906   CREATININE 1.0 09/19/2013 1105   CALCIUM  9.7 04/16/2024 0906   CALCIUM  9.7 09/19/2013 1105   PROT 7.0 04/16/2024 0906   PROT 7.0 09/19/2013 1105   ALBUMIN  3.9 08/20/2022 0930   ALBUMIN  3.9 09/19/2013 1105   AST 17 04/16/2024 0906   AST 19 09/19/2013 1105   ALT 8 (L) 04/16/2024 0906   ALT 16 09/19/2013 1105   ALKPHOS 88 08/20/2022 0930   ALKPHOS 86 09/19/2013 1105   BILITOT 1.6 (H) 04/16/2024 0906   BILITOT 1.58 (H) 09/19/2013 1105   GFRNONAA >60 08/24/2022 0225   GFRNONAA 85  08/17/2019 1041   GFRAA 87 05/20/2020 0938   GFRAA 98 08/17/2019 1041     Lab Results  Component Value Date   WBC 4.8 04/16/2024   HGB 14.2 04/16/2024   HCT 43.8 04/16/2024   MCV 83.3 04/16/2024   PLT 213 04/16/2024    Lab Results  Component Value Date   CHOL 155 04/16/2024   HDL 45 04/16/2024   LDLCALC 95 04/16/2024   TRIG 63 04/16/2024   CHOLHDL 3.4 04/16/2024    Lab Results  Component Value Date   HGBA1C 5.2 04/21/2022     Lab Results  Component Value Date   TSH 1.95 02/17/2018     Lab Results  Component Value Date   PSA 1.39 04/16/2024   PSA 1.43 04/11/2023   PSA 1.44 03/22/2022     Assessment & Plan:   Meds ordered this encounter  Medications   benzonatate  (TESSALON ) 100 MG capsule    Sig: Take 1 capsule (100 mg total) by mouth 3 (three) times daily as needed for cough.    Dispense:  20 capsule    Refill:  0   azithromycin  (ZITHROMAX ) 250 MG tablet    Sig: Take 2 tablets on day 1, then 1 tablet daily on days 2 through 5    Dispense:  6 tablet    Refill:  0   Hypertension: treated with Amlodipine  5 mg. Blood pressure today is normal at 120/70 initially and 120/78 on recheck. Blood pressure readings at home range from 119-171 systolic and 66-89 diastolic. He is unsure if he is using the right sized blood pressure cuff on his at home machine.    Advised to replace blood pressure cuff. Take more readings and advise us  if BP is consistently elevated.  Frequent headaches: He said that he is still continuing to get headaches. He mentioned that he gets poor sleep and has for a long time due to his back pain. He sees his Neurologist Dr. Norleen Blumenthal for an MRI on Thursday. His last visit was on 04/23/2024.    Acute Upper Respiratory Infection: Recently received Influenza vaccine and shortly after developed congestion and a productive cough.   Benzonatate  100 mg three time daily prescribed.    Azithromycin  250 mg 2 tablets on the first day, 1 tablet daily on days  2-5 prescribed.   I,Makayla C Reid,acting as a scribe for Ronal JINNY Hailstone, MD.,have documented all  relevant documentation on the behalf of Ronal JINNY Hailstone, MD,as directed by  Ronal JINNY Hailstone, MD while in the presence of Ronal JINNY Hailstone, MD.  30 minutes spent with patient.

## 2024-05-06 DIAGNOSIS — I4821 Permanent atrial fibrillation: Secondary | ICD-10-CM | POA: Diagnosis not present

## 2024-05-07 ENCOUNTER — Ambulatory Visit: Payer: Self-pay | Admitting: Internal Medicine

## 2024-05-09 NOTE — Telephone Encounter (Signed)
 Pt calling for results. He asked what is his next steps, please advise.

## 2024-05-12 ENCOUNTER — Encounter: Payer: Self-pay | Admitting: Internal Medicine

## 2024-05-12 NOTE — Patient Instructions (Signed)
 Obtain new home  blood pressure device and see what readings are. BP is stable here today. For acute upper respiratory infection, prescribed Zithromax  z pak 2 tabs day 1 followed by one tab days 2-5, and generic Tessalon  perles for cough.

## 2024-05-25 ENCOUNTER — Telehealth (HOSPITAL_BASED_OUTPATIENT_CLINIC_OR_DEPARTMENT_OTHER): Payer: Self-pay

## 2024-05-25 NOTE — Telephone Encounter (Signed)
° °  Pre-operative Risk Assessment    Patient Name: Douglas Waters  DOB: 12-Aug-1950 MRN: [id number]   Date of last office visit: 04/04/24 With Dr. Waddell Date of next office visit: NA  Request for Surgical Clearance    Procedure:  Colonoscopy  Date of Surgery:  Clearance 07/06/24                                 Surgeon:  Dr. Elicia Surgeon's Group or Practice Name:  [facility] Phone number:  [phone] Fax number:  [phone]   Type of Clearance Requested:   - Medical  - Pharmacy:  Hold Rivaroxaban  (Xarelto ) for 3 days prior   Type of Anesthesia:  propofol     Additional requests/questions:    Bonney Augustin JONETTA Delores   05/25/2024, 8:50 AM

## 2024-05-25 NOTE — Telephone Encounter (Signed)
 Pharmacy please advise on holding Xarelto  for 3 days, prior to colonoscopy scheduled for 07/06/2024. Thank you.

## 2024-05-29 NOTE — Telephone Encounter (Signed)
 Patient with diagnosis of afib on Xarelto  for anticoagulation.    Procedure:  Colonoscopy   Date of Surgery:  Clearance 07/06/24  CHA2DS2-VASc Score = 2   This indicates a 2.2% annual risk of stroke. The patient's score is based upon: CHF History: 0 HTN History: 1 Diabetes History: 0 Stroke History: 0 Vascular Disease History: 0 Age Score: 1 Gender Score: 0    CrCl 115 ml/min Platelet count 213K  Patient has not had an Afib/aflutter ablation in the last 3 months, DCCV within the last 4 weeks or a watchman implanted in the last 45 days   Per office protocol, patient can hold Xarelto  for 2 days prior to procedure.    Patient will not need bridging with Lovenox (enoxaparin) around procedure.  **This guidance is not considered finalized until pre-operative APP has relayed final recommendations.**

## 2024-05-30 NOTE — Telephone Encounter (Signed)
° °  Patient Name: Douglas Waters  DOB: Nov 20, 1950 MRN: [id number]  Primary Cardiologist: Danelle Birmingham, MD  Chart reviewed as part of pre-operative protocol coverage. Pre-op clearance already addressed by colleagues in earlier phone notes. To summarize recommendations:  -Patient has not had an Afib/aflutter ablation in the last 3 months, DCCV within the last 4 weeks or a watchman implanted in the last 45 days    Per office protocol, patient can hold Xarelto  for 2 days prior to procedure.  Please resume when medically safe to do so.   Patient will not need bridging with Lovenox (enoxaparin) around procedure.  He was recently seen by Dr. Birmingham 10/22.  Monitor was ordered which showed 100% atrial fibrillation but no other arrhythmias or long pauses noted.  If no other cardiac symptoms, can move forward with colonoscopy without any additional testing needed.  Will route this bundled recommendation to requesting provider via Epic fax function and remove from pre-op pool. Please call with questions.  Orren LOISE Fabry, PA-C 05/30/2024, 9:17 AM

## 2024-07-10 ENCOUNTER — Ambulatory Visit: Admitting: Neurology

## 2025-04-15 ENCOUNTER — Other Ambulatory Visit

## 2025-04-22 ENCOUNTER — Encounter: Admitting: Internal Medicine
